# Patient Record
Sex: Male | Born: 1955 | Race: White | Hispanic: No | Marital: Married | Smoking: Never smoker
Health system: Southern US, Community
[De-identification: ages and names within clinical notes are randomized; demographics above are authoritative.]

## PROBLEM LIST (undated history)

## (undated) DIAGNOSIS — E781 Pure hyperglyceridemia: Secondary | ICD-10-CM

## (undated) DIAGNOSIS — F909 Attention-deficit hyperactivity disorder, unspecified type: Secondary | ICD-10-CM

## (undated) DIAGNOSIS — J302 Other seasonal allergic rhinitis: Secondary | ICD-10-CM

## (undated) DIAGNOSIS — G4733 Obstructive sleep apnea (adult) (pediatric): Secondary | ICD-10-CM

## (undated) DIAGNOSIS — L409 Psoriasis, unspecified: Secondary | ICD-10-CM

## (undated) DIAGNOSIS — I499 Cardiac arrhythmia, unspecified: Secondary | ICD-10-CM

## (undated) DIAGNOSIS — J45909 Unspecified asthma, uncomplicated: Secondary | ICD-10-CM

## (undated) DIAGNOSIS — F32A Depression, unspecified: Secondary | ICD-10-CM

## (undated) DIAGNOSIS — J069 Acute upper respiratory infection, unspecified: Secondary | ICD-10-CM

## (undated) DIAGNOSIS — R7303 Prediabetes: Secondary | ICD-10-CM

## (undated) DIAGNOSIS — F329 Major depressive disorder, single episode, unspecified: Secondary | ICD-10-CM

## (undated) HISTORY — PX: OTHER SURGICAL HISTORY: SHX169

## (undated) HISTORY — DX: Acute upper respiratory infection, unspecified: J06.9

## (undated) HISTORY — DX: Cardiac arrhythmia, unspecified: I49.9

## (undated) HISTORY — DX: Prediabetes: R73.03

## (undated) HISTORY — DX: Pure hyperglyceridemia: E78.1

## (undated) HISTORY — DX: Obstructive sleep apnea (adult) (pediatric): G47.33

## (undated) HISTORY — DX: Psoriasis, unspecified: L40.9

## (undated) HISTORY — PX: INGUINAL HERNIA REPAIR: SHX194

---

## 2000-03-08 ENCOUNTER — Ambulatory Visit (HOSPITAL_BASED_OUTPATIENT_CLINIC_OR_DEPARTMENT_OTHER): Admission: RE | Admit: 2000-03-08 | Discharge: 2000-03-08 | Payer: Self-pay | Admitting: *Deleted

## 2004-09-21 DIAGNOSIS — R7303 Prediabetes: Secondary | ICD-10-CM

## 2004-09-21 HISTORY — DX: Prediabetes: R73.03

## 2004-11-04 ENCOUNTER — Ambulatory Visit: Payer: Self-pay | Admitting: Internal Medicine

## 2004-11-16 ENCOUNTER — Ambulatory Visit: Payer: Self-pay | Admitting: Internal Medicine

## 2004-11-16 ENCOUNTER — Ambulatory Visit (HOSPITAL_BASED_OUTPATIENT_CLINIC_OR_DEPARTMENT_OTHER): Admission: RE | Admit: 2004-11-16 | Discharge: 2004-11-16 | Payer: Self-pay | Admitting: Internal Medicine

## 2004-12-01 ENCOUNTER — Ambulatory Visit: Payer: Self-pay | Admitting: Internal Medicine

## 2004-12-29 ENCOUNTER — Ambulatory Visit: Payer: Self-pay | Admitting: Internal Medicine

## 2005-01-22 ENCOUNTER — Ambulatory Visit (HOSPITAL_BASED_OUTPATIENT_CLINIC_OR_DEPARTMENT_OTHER): Admission: RE | Admit: 2005-01-22 | Discharge: 2005-01-22 | Payer: Self-pay | Admitting: Surgery

## 2011-07-13 ENCOUNTER — Other Ambulatory Visit: Payer: Self-pay | Admitting: Family Medicine

## 2011-07-13 ENCOUNTER — Ambulatory Visit
Admission: RE | Admit: 2011-07-13 | Discharge: 2011-07-13 | Disposition: A | Discharge status: Home / Self Care | Payer: BC Managed Care – PPO | Source: Ambulatory Visit | Attending: Family Medicine | Admitting: Family Medicine

## 2011-07-13 DIAGNOSIS — R05 Cough: Secondary | ICD-10-CM

## 2012-03-27 ENCOUNTER — Emergency Department (HOSPITAL_COMMUNITY): Payer: BC Managed Care – PPO

## 2012-03-27 ENCOUNTER — Encounter (HOSPITAL_COMMUNITY): Payer: Self-pay

## 2012-03-27 ENCOUNTER — Emergency Department (HOSPITAL_COMMUNITY)
Admission: EM | Admit: 2012-03-27 | Discharge: 2012-03-27 | Disposition: A | Discharge status: Home / Self Care | Payer: BC Managed Care – PPO | Attending: Emergency Medicine | Admitting: Emergency Medicine

## 2012-03-27 DIAGNOSIS — R05 Cough: Secondary | ICD-10-CM | POA: Insufficient documentation

## 2012-03-27 DIAGNOSIS — R059 Cough, unspecified: Secondary | ICD-10-CM | POA: Insufficient documentation

## 2012-03-27 DIAGNOSIS — F3289 Other specified depressive episodes: Secondary | ICD-10-CM | POA: Insufficient documentation

## 2012-03-27 DIAGNOSIS — F329 Major depressive disorder, single episode, unspecified: Secondary | ICD-10-CM | POA: Insufficient documentation

## 2012-03-27 DIAGNOSIS — J45909 Unspecified asthma, uncomplicated: Secondary | ICD-10-CM | POA: Insufficient documentation

## 2012-03-27 DIAGNOSIS — R61 Generalized hyperhidrosis: Secondary | ICD-10-CM | POA: Insufficient documentation

## 2012-03-27 DIAGNOSIS — R070 Pain in throat: Secondary | ICD-10-CM | POA: Insufficient documentation

## 2012-03-27 DIAGNOSIS — J189 Pneumonia, unspecified organism: Secondary | ICD-10-CM | POA: Insufficient documentation

## 2012-03-27 DIAGNOSIS — R5381 Other malaise: Secondary | ICD-10-CM | POA: Insufficient documentation

## 2012-03-27 DIAGNOSIS — Z79899 Other long term (current) drug therapy: Secondary | ICD-10-CM | POA: Insufficient documentation

## 2012-03-27 DIAGNOSIS — R404 Transient alteration of awareness: Secondary | ICD-10-CM | POA: Insufficient documentation

## 2012-03-27 DIAGNOSIS — R5383 Other fatigue: Secondary | ICD-10-CM | POA: Insufficient documentation

## 2012-03-27 DIAGNOSIS — F909 Attention-deficit hyperactivity disorder, unspecified type: Secondary | ICD-10-CM | POA: Insufficient documentation

## 2012-03-27 DIAGNOSIS — R55 Syncope and collapse: Secondary | ICD-10-CM | POA: Insufficient documentation

## 2012-03-27 DIAGNOSIS — J3489 Other specified disorders of nose and nasal sinuses: Secondary | ICD-10-CM | POA: Insufficient documentation

## 2012-03-27 HISTORY — DX: Other seasonal allergic rhinitis: J30.2

## 2012-03-27 HISTORY — DX: Depression, unspecified: F32.A

## 2012-03-27 HISTORY — DX: Unspecified asthma, uncomplicated: J45.909

## 2012-03-27 HISTORY — DX: Attention-deficit hyperactivity disorder, unspecified type: F90.9

## 2012-03-27 HISTORY — DX: Major depressive disorder, single episode, unspecified: F32.9

## 2012-03-27 LAB — BASIC METABOLIC PANEL
BUN: 22 mg/dL (ref 6–23)
Chloride: 105 mEq/L (ref 96–112)
GFR calc Af Amer: 67 mL/min — ABNORMAL LOW (ref >90)
GFR calc non Af Amer: 58 mL/min — ABNORMAL LOW (ref >90)
Potassium: 3.8 mEq/L (ref 3.5–5.1)
Sodium: 140 mEq/L (ref 135–145)

## 2012-03-27 LAB — URINALYSIS, ROUTINE W REFLEX MICROSCOPIC
Hgb urine dipstick: NEGATIVE
Leukocytes, UA: NEGATIVE
Nitrite: NEGATIVE
Protein, ur: NEGATIVE mg/dL
Specific Gravity, Urine: 1.027 (ref 1.005–1.030)
Urobilinogen, UA: 0.2 mg/dL (ref 0.0–1.0)

## 2012-03-27 LAB — CBC WITH DIFFERENTIAL/PLATELET
Basophils Absolute: 0 10*3/uL (ref 0.0–0.1)
Basophils Relative: 0 % (ref 0–1)
Eosinophils Absolute: 0.1 10*3/uL (ref 0.0–0.7)
Hemoglobin: 14 g/dL (ref 13.0–17.0)
MCH: 30.4 pg (ref 26.0–34.0)
MCHC: 35.4 g/dL (ref 30.0–36.0)
Monocytes Relative: 2 % — ABNORMAL LOW (ref 3–12)
Neutro Abs: 10.8 10*3/uL — ABNORMAL HIGH (ref 1.7–7.7)
Neutrophils Relative %: 94 % — ABNORMAL HIGH (ref 43–77)
Platelets: 166 10*3/uL (ref 150–400)

## 2012-03-27 LAB — TROPONIN I: Troponin I: <0.3 ng/mL (ref <0.30)

## 2012-03-27 MED ORDER — DEXTROSE 5 % IV SOLN
1.0000 g | Freq: Once | INTRAVENOUS | Status: AC
  Administered 2012-03-27: 1 g via INTRAVENOUS
  Filled 2012-03-27: qty 10

## 2012-03-27 MED ORDER — SODIUM CHLORIDE 0.9 % IV BOLUS (SEPSIS)
1000.0000 mL | Freq: Once | INTRAVENOUS | Status: AC
  Administered 2012-03-27: 1000 mL via INTRAVENOUS

## 2012-03-27 MED ORDER — BENZONATATE 100 MG PO CAPS: 100.0000 mg | ORAL_CAPSULE | Freq: Three times a day (TID) | ORAL | Status: AC

## 2012-03-27 MED ORDER — AZITHROMYCIN 250 MG PO TABS: 250.0000 mg | ORAL_TABLET | Freq: Every day | ORAL | Status: AC

## 2012-03-27 NOTE — ED Notes (Signed)
Pt's orthostatic VS taken.  Pt ambulated without difficulty.

## 2012-03-27 NOTE — ED Notes (Signed)
PA at bedside.

## 2012-03-27 NOTE — ED Notes (Signed)
Patient transported to X-ray 

## 2012-03-27 NOTE — ED Notes (Signed)
The patient was in the middle of urinating (while standing up).  He does not remember bearing down to urinate or to pass gas.  Suddenly, he woke up on the floor.  He says he does not remember feeling lightheaded, dizzy, or weak.

## 2012-03-27 NOTE — ED Notes (Signed)
Pt reports hot having any clothes, came in ems, pt given paper scrubs to go home in.

## 2012-03-27 NOTE — ED Provider Notes (Signed)
History     CSN: [id number]  Arrival date & time 03/27/12  0605   First MD Initiated Contact with Patient 03/27/12 [phone]      Chief Complaint  Patient presents with  . Loss of Consciousness    (Consider location/radiation/quality/duration/timing/severity/associated sxs/prior treatment) HPI Comments: Pt presents after episode of LOC. He got up to use the bathroom and after urinating lost consciousness. He does not recall any preceding symptoms (visual change, dizziness, weakness, palpitations, chest pain, SOB). Spouse reports that she heard him fall and found him conscious and sweaty appearing but he was not immediately responding to her questions. She denies noting any seizure like activity. Pt reports that he currently feels a little "weak."  Pt has never had anything like this before. He has no known cardiac, seizure, neurologic hx. He has been diagnosed with htn previously but is not currently taking medication for this.  Pt reports that he has been coughing and has been congested with sore throat.  Wife does report that they were recently on an all night flight from [place] to [place]. Pt has not had any leg swelling, hemoptysis. He denies SOB or CP.  Patient is a 56 y.o. male presenting with syncope. The history is provided by the patient and the spouse.  Loss of Consciousness This is a new problem. The current episode started today. Associated symptoms include congestion, coughing, diaphoresis, a sore throat and weakness. Pertinent negatives include no abdominal pain, chest pain, headaches, myalgias, nausea, neck pain, numbness or vomiting.    Past Medical History  Diagnosis Date  . ADHD (attention deficit hyperactivity disorder)   . Depression   . Asthma   . Seasonal allergies     Past Surgical History  Procedure Date  . Inguinal hernia repair     right side (4-5 years ago)    Family History  Problem Relation Age of Onset  . Depression Mother   .  Tuberculosis Mother   . Osteoporosis Mother   . Diabetes type II Father     History  Substance Use Topics  . Smoking status: Never Smoker   . Smokeless tobacco: Not on file  . Alcohol Use: 8.4 oz/week    14 Cans of beer per week      Review of Systems  Constitutional: Positive for diaphoresis.  HENT: Positive for congestion and sore throat. Negative for facial swelling, neck pain and neck stiffness.   Eyes: Negative for visual disturbance.  Respiratory: Positive for cough. Negative for shortness of breath.   Cardiovascular: Positive for syncope. Negative for chest pain, palpitations and leg swelling.  Gastrointestinal: Negative for nausea, vomiting and abdominal pain.  Musculoskeletal: Negative for myalgias.  Neurological: Positive for syncope and weakness. Negative for dizziness, seizures, facial asymmetry, numbness and headaches.  All other systems reviewed and are negative.    Allergies  Review of patient's allergies indicates no known allergies.  Home Medications   Current Outpatient Rx  Name Route Sig Dispense Refill  . AMPHETAMINE-DEXTROAMPHET ER 30 MG PO CP24 Oral Take 30 mg by mouth every morning.    Marland Kitchen BUPROPION HCL ER (XL) 300 MG PO TB24 Oral Take 300 mg by mouth daily.    Marland Kitchen FLUOXETINE HCL 20 MG PO CAPS Oral Take 20 mg by mouth daily.    Marland Kitchen FLUTICASONE-SALMETEROL 250-50 MCG/DOSE IN AEPB Inhalation Inhale 1 puff into the lungs every 12 (twelve) hours.      BP 110/64  Pulse 69  Temp 98.5 F (  36.9 C) (Oral)  Resp 18  Ht 5\' 10"  (1.778 m)  Wt 195 lb (88.451 kg)  BMI 27.98 kg/m2  SpO2 97%  Physical Exam  Nursing note and vitals reviewed. Constitutional: He is oriented to person, place, and time. He appears well-developed and well-nourished. No distress.       Mildly diaphoretic, uncomfortable appearing  HENT:  Head: Normocephalic and atraumatic.  Mouth/Throat: Oropharynx is clear and moist. No oropharyngeal exudate.       Mildly erythematous posterior  oropharynx c/w postnasal drip  Eyes: EOM are normal. Pupils are equal, round, and reactive to light.  Neck: Normal range of motion. Neck supple.  Cardiovascular: Normal rate, regular rhythm and normal heart sounds.  Exam reveals no gallop and no friction rub.   No murmur heard. Pulmonary/Chest: Effort normal and breath sounds normal. He has no wheezes. He exhibits no tenderness.  Abdominal: Soft. Bowel sounds are normal. There is no tenderness. There is no rebound and no guarding.  Musculoskeletal: Normal range of motion.       Spine: No palpable stepoff, crepitus, or gross deformity appreciated. No appreciable spasm of paravertebral muscles. No midline tenderness.  Neurological: He is alert and oriented to person, place, and time.       Speech clear, pupils equal round reactive to light, extraocular movements intact   Normal peripheral visual fields Cranial nerves III through XII normal including no facial droop Follows commands, moves all extremities x4, normal strength to bilateral upper and lower extremities at all major muscle groups including grip Sensation normal to light touch Coordination intact, no limb ataxia, finger-nose-finger normal Rapid alternating movements normal No pronator drift Gait normal  Skin: Skin is warm. He is diaphoretic.  Psychiatric: He has a normal mood and affect.    ED Course  Procedures (including critical care time)   Date: 03/27/2012  Rate: 68  Rhythm: normal sinus rhythm  QRS Axis: normal  Intervals: PR prolonged  ST/T Wave abnormalities: normal  Conduction Disutrbances:first-degree A-V block   Narrative Interpretation:   Old EKG Reviewed: none available  Labs Reviewed  CBC WITH DIFFERENTIAL - Abnormal; Notable for the following:    WBC 11.5 (*)     Neutrophils Relative 94 (*)     Neutro Abs 10.8 (*)     Lymphocytes Relative 3 (*)     Lymphs Abs 0.4 (*)     Monocytes Relative 2 (*)     All other components within normal limits  BASIC  METABOLIC PANEL - Abnormal; Notable for the following:    Glucose, Bld 126 (*)     GFR calc non Af Amer 58 (*)     GFR calc Af Amer 67 (*)     All other components within normal limits  URINALYSIS, ROUTINE W REFLEX MICROSCOPIC - Abnormal; Notable for the following:    Color, Urine AMBER (*)  BIOCHEMICALS MAY BE AFFECTED BY COLOR   APPearance CLOUDY (*)     Bilirubin Urine SMALL (*)     Ketones, ur 15 (*)     All other components within normal limits  TROPONIN I  LAB REPORT - SCANNED   Dg Chest 2 View  03/27/2012  *RADIOLOGY REPORT*  Clinical Data: Syncope.  CHEST - 2 VIEW  Comparison: Two-view chest x-ray 07/13/2011.  Findings: Cardiac silhouette upper normal in size but stable. Thoracic aorta minimally atherosclerotic, unchanged.  Hilar and mediastinal contours otherwise unremarkable.  Airspace consolidation in the medial left lower lobe.  Lungs otherwise clear.  No pleural effusions.  Visualized bony thorax intact.  IMPRESSION: Left lower lobe pneumonia.  Original Report Authenticated By: Arnell Sieving, M.D.   Ct Head Wo Contrast  03/27/2012  *RADIOLOGY REPORT*  Clinical Data: Syncope with loss of consciousness.  CT HEAD WITHOUT CONTRAST  Technique:  Contiguous axial images were obtained from the base of the skull through the vertex without contrast.  Comparison: None.  Findings: Ventricular system normal in size and appearance for age. No mass lesion.  No midline shift.  No acute hemorrhage or hematoma.  No extra-axial fluid collections.  No evidence of acute infarction.  Physiologic calcification in the right basal ganglia. No significant focal brain parenchymal abnormalities.  No focal osseous abnormalities involving the skull.  Visualized paranasal sinuses, mastoid air cells, and middle ear cavities well- aerated.  IMPRESSION: Normal examination.  Original Report Authenticated By: Arnell Sieving, M.D.   I personally reviewed the imaging studies  1. CAP (community acquired  pneumonia)   2. Syncope       MDM  Pt with post micturition syncope. No neuro deficits on exam. Head CT negative; ECG with 1st deg AVB but no ischemic changes; negative trop. CXR shows LLL pneumonia. Pt initially uncomfortable appearing, was given 1L bolus and felt and appeared better with this. He is not orthostatic and was able to ambulate without difficulty after receiving bolus.  PE is a consideration, but given lack of abnormal vitals, no CP/SOB/hemoptysis/leg swelling, and presence of cough and congestion, feel this is not likely. CXR findings and leukocytosis support dx of PNA.  Pt given dose of rocephin in dept, discharged with z-pack. Strict return precautions discussed. Pt and spouse verbalized understanding and agreed to plan.  Grant Fontana, PA-C 03/28/12 1003

## 2012-03-27 NOTE — ED Notes (Signed)
Family at bedside. 

## 2012-03-27 NOTE — ED Notes (Signed)
MD at bedside. EDPA Mayford Knife

## 2012-03-27 NOTE — ED Notes (Signed)
Pt reports a syncopal episode while standing this am to urinate, nausea, weakness, and upper back "cramping. Pt denies chest/abd pain, V/D, diaphoresis, or dizziness. 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

## 2012-03-28 NOTE — ED Provider Notes (Signed)
Medical screening examination/treatment/procedure(s) were conducted as a shared visit with non-physician practitioner(s) and myself.  I personally evaluated the patient during the encounter.  I saw the patient along with C. Williams and agree with her note, assessment, and plan.  The patient presents with complaints of cough for several days, this morning he felt weak and passed out in the bathroom while urinating.  He continues to feel light-headed upon arrival here.  On exam, the patient vitals are stable and the patient is afebrile.  The heart, lungs, and neurological exam are all within normal limits.  The workup reveals a pneumonia on chest xray, mild wbc count, but is otherwise unremarkable.  He was hydrated and observed in the ER for some time.  He began to feel much better and was ambulated without incident.  We had offered admission, however the patient preferred to go home.  He was given rocephin and zithromax in the ED and will be discharged with zmax.  He agrees to return if he experiences further difficulty.    Geoffery Lyons, MD 03/28/12 1500

## 2013-09-22 ENCOUNTER — Encounter: Payer: Self-pay | Admitting: General Surgery

## 2013-09-22 DIAGNOSIS — G4733 Obstructive sleep apnea (adult) (pediatric): Secondary | ICD-10-CM

## 2013-10-16 ENCOUNTER — Encounter: Payer: Self-pay | Admitting: Cardiology

## 2013-10-16 ENCOUNTER — Ambulatory Visit (INDEPENDENT_AMBULATORY_CARE_PROVIDER_SITE_OTHER): Payer: BC Managed Care – PPO | Admitting: Cardiology

## 2013-10-16 VITALS — BP 132/80 | HR 82 | Ht 69.0 in | Wt 199.0 lb

## 2013-10-16 DIAGNOSIS — G4733 Obstructive sleep apnea (adult) (pediatric): Secondary | ICD-10-CM

## 2013-10-16 NOTE — Patient Instructions (Signed)
Your physician recommends that you continue on your current medications as directed. Please refer to the Current Medication list given to you today.  Your physician wants you to follow-up in: 6 Months with Dr Turner You will receive a reminder letter in the mail two months in advance. If you don't receive a letter, please call our office to schedule the follow-up appointment.  

## 2013-10-16 NOTE — Progress Notes (Signed)
[place], [address] [name], [place]  [address] Phone: [phone] Fax:  [phone]  Date:  10/16/2013   ID:  Jeffery Underwood, DOB [birth year], MRN [id number]  PCP:  Jeffery Coma, MD  Sleep Medicine:  Jeffery Him, MD   History of Present Illness: Jeffery Underwood is a 58 y.o. male with a history of OSA presents today for followup.  He is doing well.  He tolerates his CPAP well.  He uses a nasal pillow mask which he tolerates and feels the pressure is adequate.  He feels rested when he gets up and has no daytime sleepiness.  He exercises 1-3 times weekly for an hour cardio and weights.   Wt Readings from Last 3 Encounters:  10/16/13 199 lb (90.266 kg)  04/13/13 197 lb (89.359 kg)  03/27/12 195 lb (88.451 kg)     Past Medical History  Diagnosis Date  . ADHD (attention deficit hyperactivity disorder)   . Depression   . Asthma   . Seasonal allergies   . OSA (obstructive sleep apnea)   . Psoriasis   . Prediabetes 2006    resolved 10/2011  . Hypertriglyceridemia     Current Outpatient Prescriptions  Medication Sig Dispense Refill  . albuterol (PROVENTIL HFA;VENTOLIN HFA) 108 (90 BASE) MCG/ACT inhaler Inhale 2 puffs into the lungs as needed for wheezing or shortness of breath.      . amphetamine-dextroamphetamine (ADDERALL XR) 30 MG 24 hr capsule Take 45 mg by mouth every morning.       Marland Kitchen aspirin 81 MG tablet Take 81 mg by mouth daily.      . budesonide-formoterol (SYMBICORT) 160-4.5 MCG/ACT inhaler Inhale 2 puffs into the lungs 2 (two) times daily.      Marland Kitchen buPROPion (WELLBUTRIN SR) 150 MG 12 hr tablet Take 150 mg by mouth daily.      . cetirizine (ZYRTEC) 10 MG tablet Take 10 mg by mouth daily.      Marland Kitchen FLUoxetine (PROZAC) 20 MG capsule Take 20 mg by mouth daily.      . Multiple Vitamins-Minerals (MULTIVITAMIN PO) Take by mouth daily.       No current facility-administered medications for this visit.    Allergies:   No Known Allergies  Social History:  The patient  reports  that he has never smoked. He does not have any smokeless tobacco history on file. He reports that he drinks about 8.4 ounces of alcohol per week. He reports that he does not use illicit drugs.   Family History:  The patient's family history includes Depression in his mother; Diabetes type II in his father; Osteoporosis in his mother; Tuberculosis in his mother.   ROS:  Please see the history of present illness.      All other systems reviewed and negative.   PHYSICAL EXAM: VS:  BP 132/80  Pulse 82  Ht 5\' 9"  (1.753 m)  Wt 199 lb (90.266 kg)  BMI 29.37 kg/m2  SpO2 95% Well nourished, well developed, in no acute distress HEENT: normal Neck: no JVD Cardiac:  normal S1, S2; RRR; no murmur Lungs:  clear to auscultation bilaterally, no wheezing, rhonchi or rales Abd: soft, nontender, no hepatomegaly Ext: no edema Skin: warm and dry Neuro:  CNs 2-12 intact, no focal abnormalities noted       ASSESSMENT AND PLAN:  1. OSA on CPAP and tolerating well.  His download today showed an AHI of 1.1/hr on 17cm H2O.  He uses his device 98% of the time  more than 4 hours nightly. He will continue on current CPAP setting.  Followup with me in 6 months  Signed, Jeffery Him, MD 10/16/2013 10:34 AM

## 2013-11-09 ENCOUNTER — Encounter: Payer: Self-pay | Admitting: Cardiology

## 2013-12-30 IMAGING — CT CT HEAD W/O CM
1 of 2 series · 16 of 30 positions shown, 20 images · non-contrast
Comparison: None.

CLINICAL DATA: Syncope with loss of consciousness.

CT HEAD WITHOUT CONTRAST
TECHNIQUE: Contiguous axial images were obtained from the base of
the skull through the vertex without contrast.

[Series 3: recon 2: brain · axial · 0.49mm/px · z∈[+150,+288]mm · 16 of 56 slices shown, 20 images]
[im 3/56  brain]
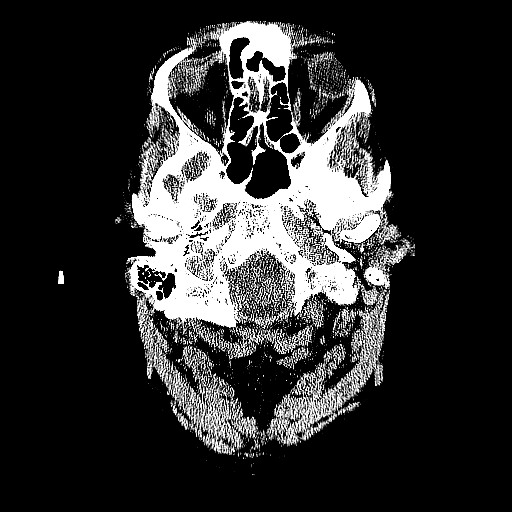
[im 3/56  bone]
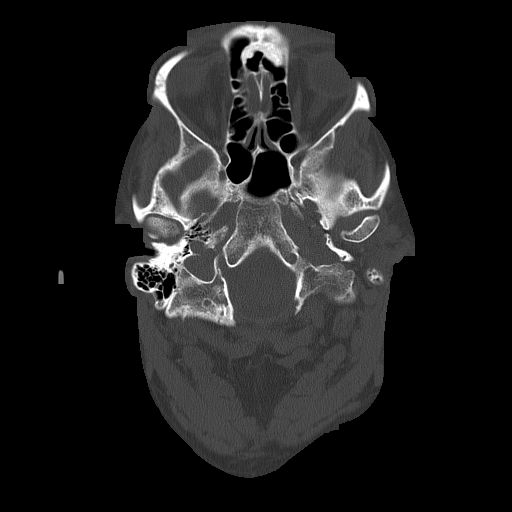
[im 6/56  brain]
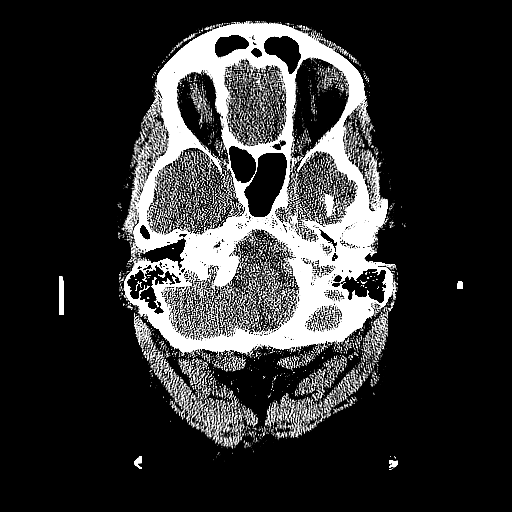
[im 9/56  brain]
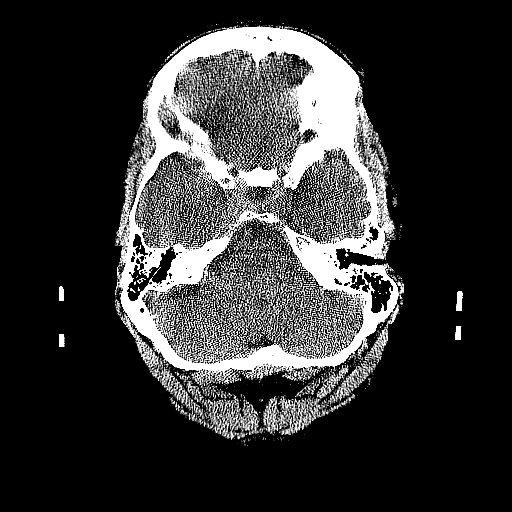
[im 12/56  brain]
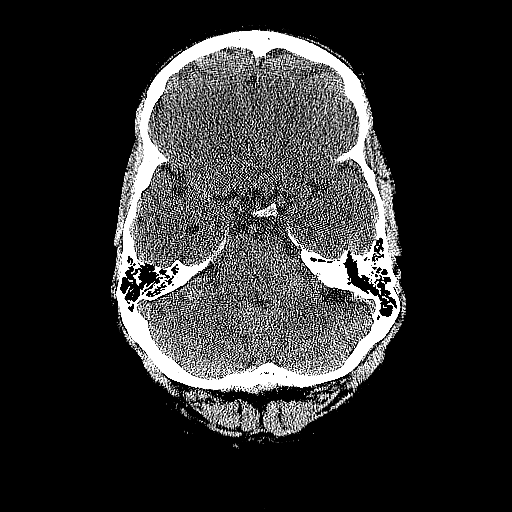
[im 18/56  brain]
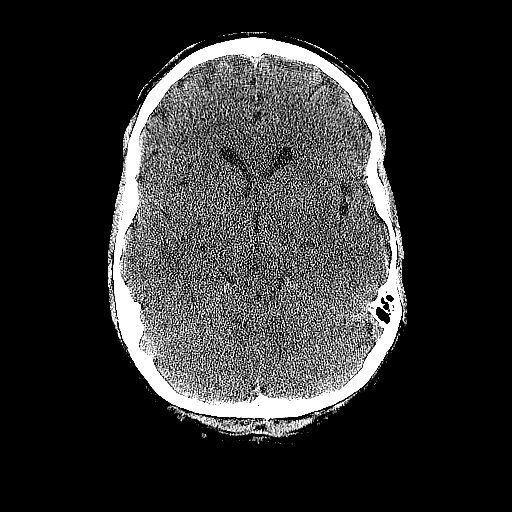
[im 18/56  bone]
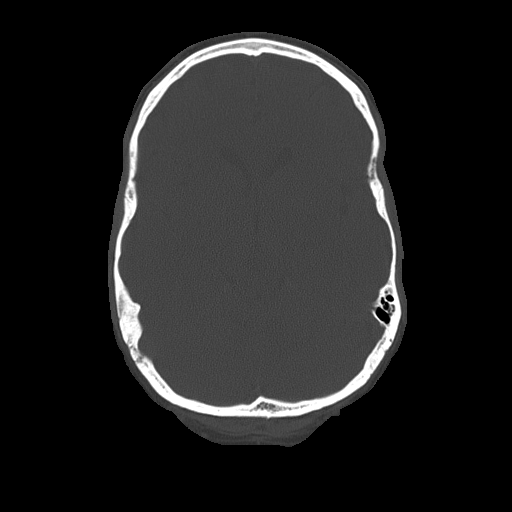
[im 21/56  brain]
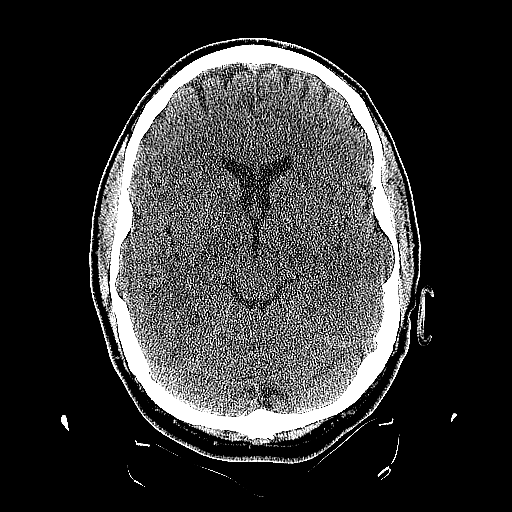
[im 24/56  brain]
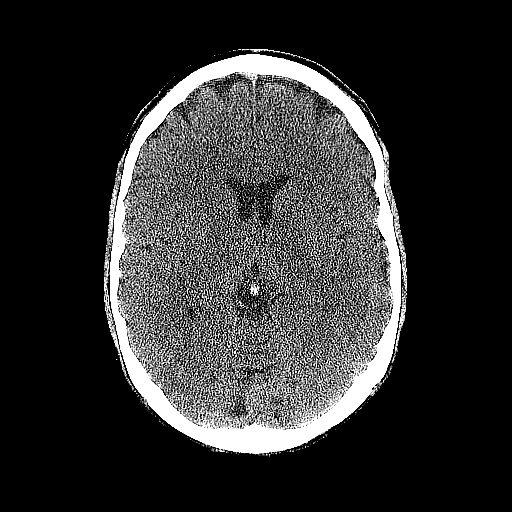
[im 27/56  brain]
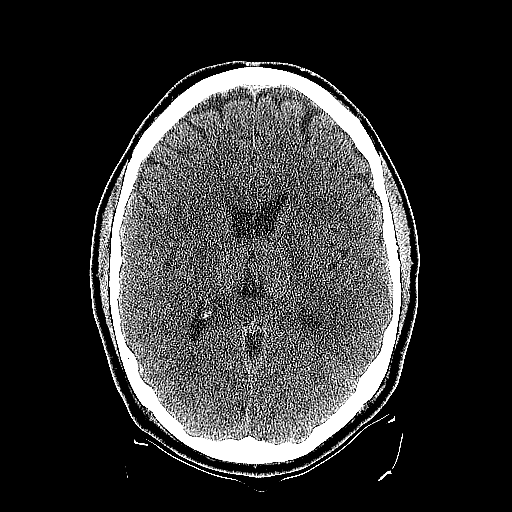
[im 29/56  brain]
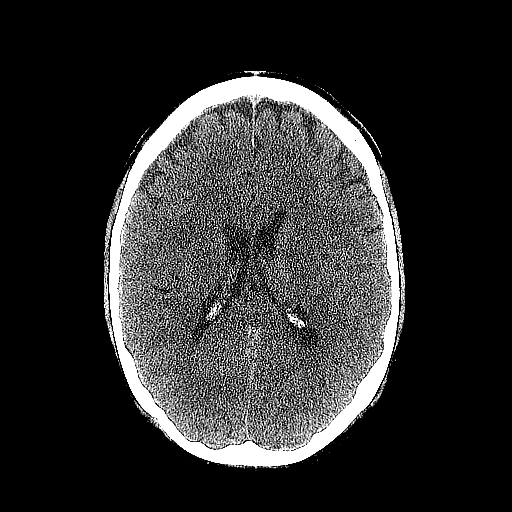
[im 29/56  bone]
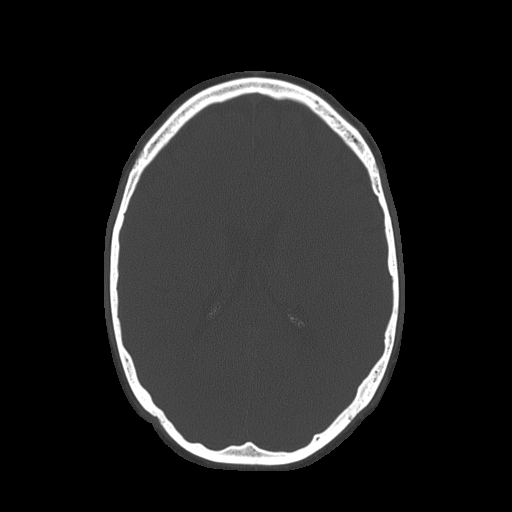
[im 32/56  brain]
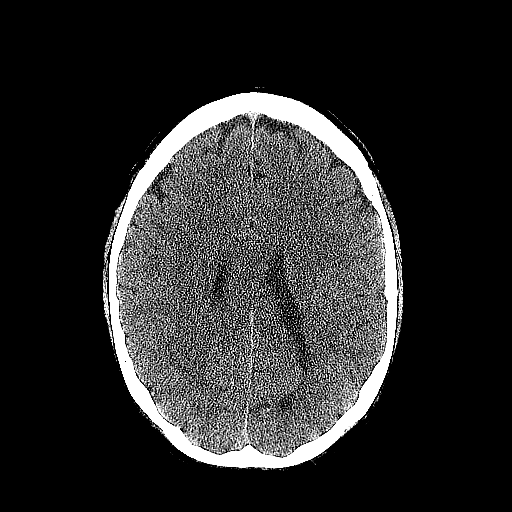
[im 35/56  brain]
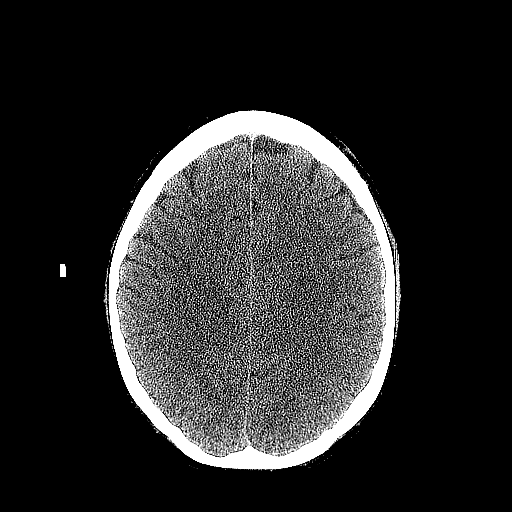
[im 38/56  brain]
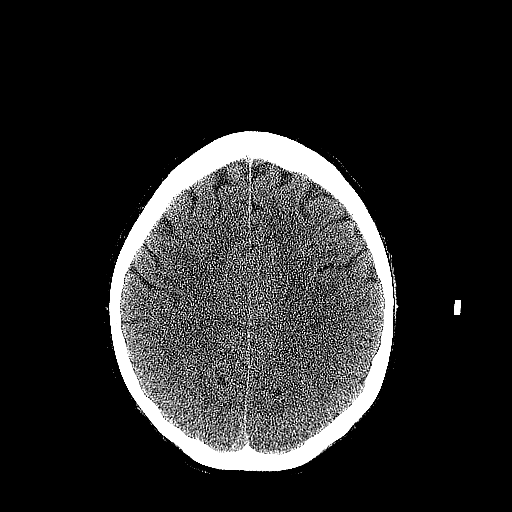
[im 44/56  brain]
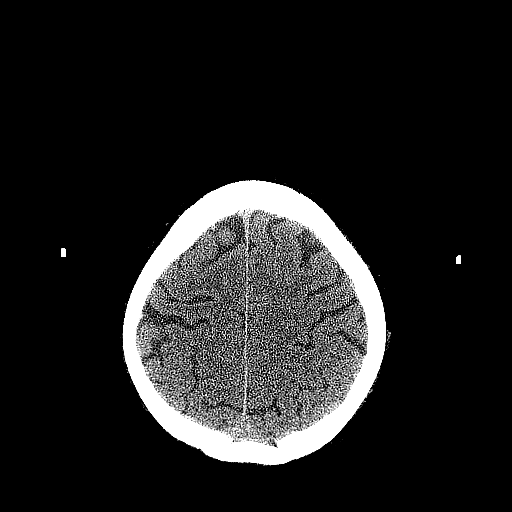
[im 44/56  bone]
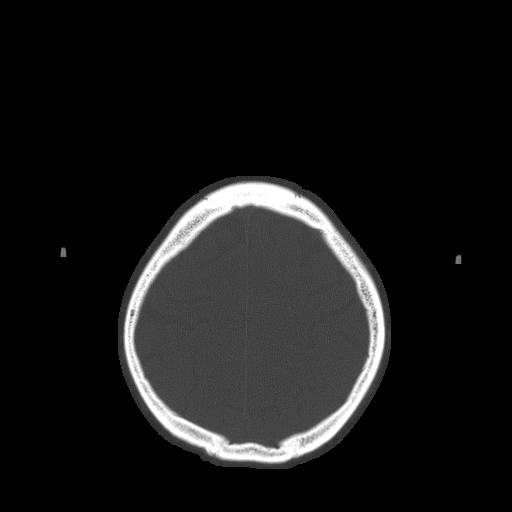
[im 47/56  brain]
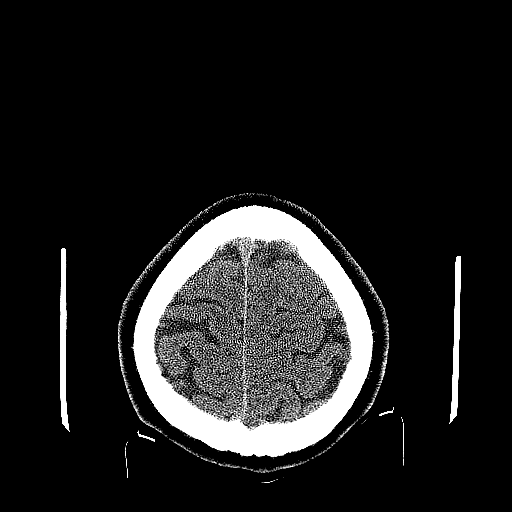
[im 50/56  brain]
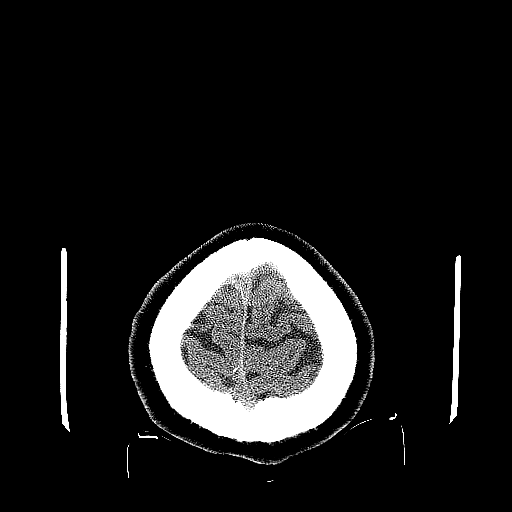
[im 53/56  brain]
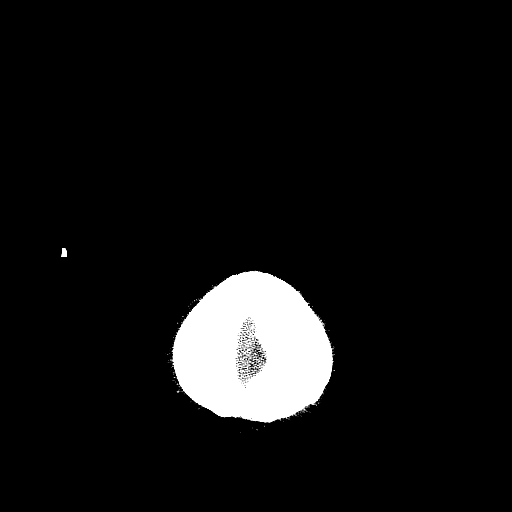

[16 of 30 positions shown; findings below may reference images not displayed]

FINDINGS: Ventricular system normal in size and appearance for age.
No mass lesion.  No midline shift.  No acute hemorrhage or
hematoma.  No extra-axial fluid collections.  No evidence of acute
infarction.  Physiologic calcification in the right basal ganglia.
No significant focal brain parenchymal abnormalities.

No focal osseous abnormalities involving the skull.  Visualized
paranasal sinuses, mastoid air cells, and middle ear cavities well-
aerated.
IMPRESSION: Normal examination.

## 2014-12-17 NOTE — Progress Notes (Signed)
Cardiology Office Note   Date:  12/18/2014   ID:  Jeffery Underwood, DOB 1956-08-28, MRN [id number]  PCP:  Lilian Coma, MD  Cardiologist:   Sueanne Margarita, MD   Chief Complaint  Patient presents with  . Sleep Apnea      History of Present Illness: Jeffery Underwood is a 59 y.o. male with a history of OSA presents today for followup. He is doing well. He tolerates his CPAP well. He uses a nasal pillow mask which he tolerates and feels the pressure is adequate. He feels rested when he gets up and has no daytime sleepiness. He denies any nasal congestion.  He exercises 1-3 times weekly for an hour cardio and weights.  He does snore some mainly when sleeping on his back.       Past Medical History  Diagnosis Date  . ADHD (attention deficit hyperactivity disorder)   . Depression   . Asthma   . Seasonal allergies   . OSA (obstructive sleep apnea)   . Psoriasis   . Prediabetes 2006    resolved 10/2011  . Hypertriglyceridemia     Past Surgical History  Procedure Laterality Date  . Inguinal hernia repair      right side (4-5 years ago)     Current Outpatient Prescriptions  Medication Sig Dispense Refill  . albuterol (PROVENTIL HFA;VENTOLIN HFA) 108 (90 BASE) MCG/ACT inhaler Inhale 2 puffs into the lungs as needed for wheezing or shortness of breath.    . amphetamine-dextroamphetamine (ADDERALL XR) 30 MG 24 hr capsule Take 45 mg by mouth every morning.     Marland Kitchen aspirin 81 MG tablet Take 81 mg by mouth daily.    . budesonide-formoterol (SYMBICORT) 160-4.5 MCG/ACT inhaler Inhale 2 puffs into the lungs 2 (two) times daily.    Marland Kitchen buPROPion (WELLBUTRIN XL) 150 MG 24 hr tablet Take 150 mg by mouth daily.    . cetirizine (ZYRTEC) 10 MG tablet Take 10 mg by mouth daily.    Marland Kitchen FLUoxetine (PROZAC) 10 MG capsule Take 10 mg by mouth daily. Pt states three 10 mg tablets by mouth daily    . Multiple Vitamins-Minerals (MULTIVITAMIN PO) Take by mouth daily.     No current facility-administered  medications for this visit.    Allergies:   Review of patient's allergies indicates no known allergies.    Social History:  The patient  reports that he has never smoked. He does not have any smokeless tobacco history on file. He reports that he drinks about 8.4 oz of alcohol per week. He reports that he does not use illicit drugs.   Family History:  The patient's family history includes Depression in his mother; Diabetes type II in his father; Osteoporosis in his mother; Tuberculosis in his mother.    ROS:  Please see the history of present illness.   Otherwise, review of systems are positive for none.   All other systems are reviewed and negative.    PHYSICAL EXAM: VS:  BP 140/74 mmHg  Pulse 81  Ht 5\' 10"  (1.778 m)  Wt 193 lb 12.8 oz (87.907 kg)  BMI 27.81 kg/m2  SpO2 97% , BMI Body mass index is 27.81 kg/(m^2). GEN: Well nourished, well developed, in no acute distress HEENT: normal Neck: no JVD, carotid bruits, or masses Cardiac: RRR; no murmurs, rubs, or gallops,no edema  Respiratory:  clear to auscultation bilaterally, normal work of breathing GI: soft, nontender, nondistended, + BS MS: no deformity or atrophy Skin: warm and dry,  no rash Neuro:  Strength and sensation are intact Psych: euthymic mood, full affect   EKG:  EKG is not ordered today. Th   Recent Labs: No results found for requested labs within last 365 days.    Lipid Panel No results found for: CHOL, TRIG, HDL, CHOLHDL, VLDL, LDLCALC, LDLDIRECT    Wt Readings from Last 3 Encounters:  12/18/14 193 lb 12.8 oz (87.907 kg)  10/16/13 199 lb (90.266 kg)  04/13/13 197 lb (89.359 kg)    ASSESSMENT AND PLAN:  1. OSA on CPAP and tolerating well. His download today showed an AHI of 1.3/hr on 17cm H2O. He uses his device 91% of the time more than 4 hours nightly. He will continue on current CPAP setting.    Current medicines are reviewed at length with the patient today.  The patient does not have  concerns regarding medicines.  The following changes have been made:  no change  Labs/ tests ordered today include: None  No orders of the defined types were placed in this encounter.     Disposition:   FU with me in 6 months   Signed, Sueanne Margarita, MD  12/18/2014 8:29 PM    [facility] [address], [place], [place]  [address] Phone: [phone]; Fax: [phone]

## 2014-12-18 ENCOUNTER — Ambulatory Visit (INDEPENDENT_AMBULATORY_CARE_PROVIDER_SITE_OTHER): Payer: Self-pay | Admitting: Cardiology

## 2014-12-18 ENCOUNTER — Encounter: Payer: Self-pay | Admitting: Cardiology

## 2014-12-18 VITALS — BP 140/74 | HR 81 | Ht 70.0 in | Wt 193.8 lb

## 2014-12-18 DIAGNOSIS — G4733 Obstructive sleep apnea (adult) (pediatric): Secondary | ICD-10-CM

## 2014-12-18 NOTE — Patient Instructions (Addendum)
Your physician wants you to follow-up in: 1 year with Dr. Turner. You will receive a reminder letter in the mail two months in advance. If you don't receive a letter, please call our office to schedule the follow-up appointment.  

## 2014-12-26 ENCOUNTER — Telehealth: Payer: Self-pay | Admitting: Cardiology

## 2014-12-26 NOTE — Telephone Encounter (Signed)
Calling stating that he needs a new Rx sent to [facility] for his CPAP equipment.  States needs new mask, head gear, hose and humidifier.  States [facility] needs new Rx everytime needs equipment.  Advised will forward to Dr. Theodosia Blender CMA, Talbert Cage.  He understands and will wait for call.

## 2014-12-26 NOTE — Telephone Encounter (Signed)
New message      Need presc for CPAP supplies sent to [facility].

## 2014-12-27 ENCOUNTER — Telehealth: Payer: Self-pay | Admitting: *Deleted

## 2014-12-27 DIAGNOSIS — Z9989 Dependence on other enabling machines and devices: Principal | ICD-10-CM

## 2014-12-27 DIAGNOSIS — G4733 Obstructive sleep apnea (adult) (pediatric): Secondary | ICD-10-CM

## 2014-12-27 NOTE — Telephone Encounter (Signed)
I spoke with Mr. Jeffery Underwood to let him know that I was working on getting his new equipment.    I have sent a staff message to Darlina Guys to find out exactly what all they need from me. Once I hear back from her I will place the orders.

## 2014-12-27 NOTE — Telephone Encounter (Signed)
Orders placed for new equipment Message sent to [facility]

## 2014-12-28 ENCOUNTER — Encounter: Payer: Self-pay | Admitting: Cardiology

## 2016-02-18 DIAGNOSIS — K5730 Diverticulosis of large intestine without perforation or abscess without bleeding: Secondary | ICD-10-CM | POA: Diagnosis not present

## 2016-02-18 DIAGNOSIS — Z1211 Encounter for screening for malignant neoplasm of colon: Secondary | ICD-10-CM | POA: Diagnosis not present

## 2016-09-03 DIAGNOSIS — D492 Neoplasm of unspecified behavior of bone, soft tissue, and skin: Secondary | ICD-10-CM | POA: Diagnosis not present

## 2016-09-03 DIAGNOSIS — D0462 Carcinoma in situ of skin of left upper limb, including shoulder: Secondary | ICD-10-CM | POA: Diagnosis not present

## 2016-09-03 DIAGNOSIS — L570 Actinic keratosis: Secondary | ICD-10-CM | POA: Diagnosis not present

## 2016-09-10 DIAGNOSIS — G4733 Obstructive sleep apnea (adult) (pediatric): Secondary | ICD-10-CM | POA: Diagnosis not present

## 2016-09-16 DIAGNOSIS — H25013 Cortical age-related cataract, bilateral: Secondary | ICD-10-CM | POA: Diagnosis not present

## 2016-09-29 DIAGNOSIS — G4733 Obstructive sleep apnea (adult) (pediatric): Secondary | ICD-10-CM | POA: Diagnosis not present

## 2016-10-21 DIAGNOSIS — H21233 Degeneration of iris (pigmentary), bilateral: Secondary | ICD-10-CM | POA: Diagnosis not present

## 2016-11-26 DIAGNOSIS — H25811 Combined forms of age-related cataract, right eye: Secondary | ICD-10-CM | POA: Diagnosis not present

## 2016-11-26 DIAGNOSIS — H2511 Age-related nuclear cataract, right eye: Secondary | ICD-10-CM | POA: Diagnosis not present

## 2016-12-09 DIAGNOSIS — Z09 Encounter for follow-up examination after completed treatment for conditions other than malignant neoplasm: Secondary | ICD-10-CM | POA: Diagnosis not present

## 2016-12-10 DIAGNOSIS — G4733 Obstructive sleep apnea (adult) (pediatric): Secondary | ICD-10-CM | POA: Diagnosis not present

## 2016-12-10 DIAGNOSIS — J301 Allergic rhinitis due to pollen: Secondary | ICD-10-CM | POA: Diagnosis not present

## 2016-12-21 DIAGNOSIS — G4733 Obstructive sleep apnea (adult) (pediatric): Secondary | ICD-10-CM | POA: Diagnosis not present

## 2016-12-23 DIAGNOSIS — D492 Neoplasm of unspecified behavior of bone, soft tissue, and skin: Secondary | ICD-10-CM | POA: Diagnosis not present

## 2016-12-29 DIAGNOSIS — Z0000 Encounter for general adult medical examination without abnormal findings: Secondary | ICD-10-CM | POA: Diagnosis not present

## 2016-12-29 DIAGNOSIS — E781 Pure hyperglyceridemia: Secondary | ICD-10-CM | POA: Diagnosis not present

## 2016-12-29 DIAGNOSIS — Z79899 Other long term (current) drug therapy: Secondary | ICD-10-CM | POA: Diagnosis not present

## 2016-12-29 DIAGNOSIS — R7301 Impaired fasting glucose: Secondary | ICD-10-CM | POA: Diagnosis not present

## 2016-12-29 DIAGNOSIS — Z125 Encounter for screening for malignant neoplasm of prostate: Secondary | ICD-10-CM | POA: Diagnosis not present

## 2017-01-05 DIAGNOSIS — H2512 Age-related nuclear cataract, left eye: Secondary | ICD-10-CM | POA: Diagnosis not present

## 2017-01-05 DIAGNOSIS — H25812 Combined forms of age-related cataract, left eye: Secondary | ICD-10-CM | POA: Diagnosis not present

## 2017-01-20 DIAGNOSIS — G4733 Obstructive sleep apnea (adult) (pediatric): Secondary | ICD-10-CM | POA: Diagnosis not present

## 2017-02-20 DIAGNOSIS — G4733 Obstructive sleep apnea (adult) (pediatric): Secondary | ICD-10-CM | POA: Diagnosis not present

## 2017-03-11 DIAGNOSIS — G4733 Obstructive sleep apnea (adult) (pediatric): Secondary | ICD-10-CM | POA: Diagnosis not present

## 2017-03-22 DIAGNOSIS — G4733 Obstructive sleep apnea (adult) (pediatric): Secondary | ICD-10-CM | POA: Diagnosis not present

## 2017-04-01 DIAGNOSIS — J069 Acute upper respiratory infection, unspecified: Secondary | ICD-10-CM | POA: Diagnosis not present

## 2017-04-01 DIAGNOSIS — J029 Acute pharyngitis, unspecified: Secondary | ICD-10-CM | POA: Diagnosis not present

## 2017-04-03 DIAGNOSIS — J069 Acute upper respiratory infection, unspecified: Secondary | ICD-10-CM | POA: Diagnosis not present

## 2017-04-03 DIAGNOSIS — H6592 Unspecified nonsuppurative otitis media, left ear: Secondary | ICD-10-CM | POA: Diagnosis not present

## 2017-04-22 DIAGNOSIS — G4733 Obstructive sleep apnea (adult) (pediatric): Secondary | ICD-10-CM | POA: Diagnosis not present

## 2017-05-23 DIAGNOSIS — G4733 Obstructive sleep apnea (adult) (pediatric): Secondary | ICD-10-CM | POA: Diagnosis not present

## 2017-06-22 DIAGNOSIS — G4733 Obstructive sleep apnea (adult) (pediatric): Secondary | ICD-10-CM | POA: Diagnosis not present

## 2017-06-23 DIAGNOSIS — G4733 Obstructive sleep apnea (adult) (pediatric): Secondary | ICD-10-CM | POA: Diagnosis not present

## 2017-11-25 DIAGNOSIS — J069 Acute upper respiratory infection, unspecified: Secondary | ICD-10-CM | POA: Diagnosis not present

## 2017-11-29 ENCOUNTER — Encounter (HOSPITAL_COMMUNITY): Payer: Self-pay | Admitting: Emergency Medicine

## 2017-11-29 ENCOUNTER — Emergency Department (HOSPITAL_COMMUNITY): Payer: BLUE CROSS/BLUE SHIELD

## 2017-11-29 ENCOUNTER — Emergency Department (HOSPITAL_COMMUNITY)
Admission: EM | Admit: 2017-11-29 | Discharge: 2017-11-30 | Disposition: A | Discharge status: Home / Self Care | Payer: BLUE CROSS/BLUE SHIELD | Attending: Emergency Medicine | Admitting: Emergency Medicine

## 2017-11-29 DIAGNOSIS — R05 Cough: Secondary | ICD-10-CM | POA: Diagnosis not present

## 2017-11-29 DIAGNOSIS — J069 Acute upper respiratory infection, unspecified: Secondary | ICD-10-CM | POA: Diagnosis not present

## 2017-11-29 DIAGNOSIS — R509 Fever, unspecified: Secondary | ICD-10-CM | POA: Diagnosis not present

## 2017-11-29 DIAGNOSIS — R42 Dizziness and giddiness: Secondary | ICD-10-CM | POA: Diagnosis not present

## 2017-11-29 DIAGNOSIS — Z7982 Long term (current) use of aspirin: Secondary | ICD-10-CM | POA: Insufficient documentation

## 2017-11-29 DIAGNOSIS — F329 Major depressive disorder, single episode, unspecified: Secondary | ICD-10-CM | POA: Insufficient documentation

## 2017-11-29 DIAGNOSIS — F909 Attention-deficit hyperactivity disorder, unspecified type: Secondary | ICD-10-CM | POA: Diagnosis not present

## 2017-11-29 DIAGNOSIS — Z79899 Other long term (current) drug therapy: Secondary | ICD-10-CM | POA: Diagnosis not present

## 2017-11-29 LAB — BASIC METABOLIC PANEL
ANION GAP: 10 (ref 5–15)
BUN: 12 mg/dL (ref 6–20)
CALCIUM: 8.9 mg/dL (ref 8.9–10.3)
CO2: 26 mmol/L (ref 22–32)
Chloride: 103 mmol/L (ref 101–111)
Creatinine, Ser: 1.07 mg/dL (ref 0.61–1.24)
GFR calc non Af Amer: >60 mL/min (ref >60)
GLUCOSE: 95 mg/dL (ref 65–99)
POTASSIUM: 3.6 mmol/L (ref 3.5–5.1)
Sodium: 139 mmol/L (ref 135–145)

## 2017-11-29 LAB — CBC WITH DIFFERENTIAL/PLATELET
BASOS ABS: 0 10*3/uL (ref 0.0–0.1)
BASOS PCT: 0 %
Eosinophils Absolute: 0.2 10*3/uL (ref 0.0–0.7)
Eosinophils Relative: 4 %
HCT: 39.6 % (ref 39.0–52.0)
Hemoglobin: 14.2 g/dL (ref 13.0–17.0)
LYMPHS PCT: 30 %
Lymphs Abs: 1.7 10*3/uL (ref 0.7–4.0)
MCH: 32.1 pg (ref 26.0–34.0)
MCHC: 35.9 g/dL (ref 30.0–36.0)
MCV: 89.6 fL (ref 78.0–100.0)
Monocytes Absolute: 0.8 10*3/uL (ref 0.1–1.0)
Monocytes Relative: 14 %
NEUTROS ABS: 3 10*3/uL (ref 1.7–7.7)
Neutrophils Relative %: 52 %
Platelets: 230 10*3/uL (ref 150–400)
RBC: 4.42 MIL/uL (ref 4.22–5.81)
RDW: 12.9 % (ref 11.5–15.5)
WBC: 5.7 10*3/uL (ref 4.0–10.5)

## 2017-11-29 MED ORDER — BENZONATATE 100 MG PO CAPS: 100.0000 mg | ORAL_CAPSULE | Freq: Three times a day (TID) | ORAL | 0 refills | Status: DC

## 2017-11-29 NOTE — ED Triage Notes (Addendum)
Pt c/o cough and congestion for several days. Pt reports intermittent fever, c/o dizziness since yesterday since taking the otc mucinex. Pt also taking ibuprofen for fever. Pt with productive cough and seen by MD on 11/25/17 dx with viral URI.

## 2017-11-29 NOTE — ED Provider Notes (Signed)
[name] Note   CSN: [id number] Arrival date & time: 11/29/17  1414     History   Chief Complaint Chief Complaint  Patient presents with  . Cough  . Dizziness    HPI Jeffery Underwood is a 62 y.o. male.  HPI   Patient is a 62 year old male with history of ADHD, depression, asthma, hyper lipidemia, OSA, who presents the ED complaining of a productive cough with yellow/green sputum and chest congestion for several days.  Also reports intermittent fevers at night.  Also complaining of dizziness/lightheadedness that began yesterday after starting over-the-counter Mucinex.  Reported that he initially had feeling of room spinning yesterday, however today had some lightheadedness upon standing.  All symptoms of dizziness and lightheadedness have resolved spontaneously since he arrived to the emergency department.  Was able to walk back to ER bed without dizziness.  Now states he is most concerned with his cough.  Also noted to have some high blood pressures at home.  No chest pain, shortness of breath, or decreased urine output.  No headache no vision changes.  No numbness, weakness, tingling to the bilateral upper or lower extremities.  No slurred speech, no confusion, no ear fullness.  Has slight sore throat which is improving.  Was seen by PCP and diagnosed with viral illness.  Patient has been taking Sudafed at home.  Past Medical History:  Diagnosis Date  . ADHD (attention deficit hyperactivity disorder)   . Asthma   . Depression   . Hypertriglyceridemia   . OSA (obstructive sleep apnea)   . Prediabetes 2006   resolved 10/2011  . Psoriasis   . Seasonal allergies     Patient Active Problem List   Diagnosis Date Noted  . OSA (obstructive sleep apnea) 09/22/2013    Past Surgical History:  Procedure Laterality Date  . INGUINAL HERNIA REPAIR     right side (4-5 years ago)       Home Medications    Prior to Admission medications     Medication Sig Start Date End Date Taking? Authorizing Provider  albuterol (PROVENTIL HFA;VENTOLIN HFA) 108 (90 BASE) MCG/ACT inhaler Inhale 2 puffs into the lungs as needed for wheezing or shortness of breath.   Yes [provider]  amphetamine-dextroamphetamine (ADDERALL XR) 30 MG 24 hr capsule Take 45 mg by mouth every morning.    Yes [provider]  aspirin 81 MG tablet Take 81 mg by mouth daily.   Yes [provider]  budesonide-formoterol (SYMBICORT) 160-4.5 MCG/ACT inhaler Inhale 2 puffs into the lungs 2 (two) times daily.   Yes [provider]  buPROPion (WELLBUTRIN XL) 150 MG 24 hr tablet Take 150 mg by mouth daily. 11/08/14  Yes [provider]  cetirizine (ZYRTEC) 10 MG tablet Take 10 mg by mouth daily.   Yes [provider]  FLUoxetine (PROZAC) 10 MG capsule Take 20 mg by mouth daily.    Yes [provider]  fluticasone (FLONASE) 50 MCG/ACT nasal spray Place 1 spray into both nostrils 2 (two) times daily. 11/18/17  Yes [provider]  Ketotifen Fumarate (ZADITOR OP) Place 1 drop into both eyes 2 (two) times daily.   Yes [provider]  Multiple Vitamins-Minerals (MULTIVITAMIN PO) Take 1 tablet by mouth daily.    Yes [provider]  benzonatate (TESSALON) 100 MG capsule Take 1 capsule (100 mg total) by mouth every 8 (eight) hours. 11/29/17   Jeffery Donaho Chauncey Cruel, PA-C    Family History  Family History  Problem Relation Age of Onset  . Depression Mother   . Tuberculosis Mother   . Osteoporosis Mother   . Diabetes type II Father     Social History Social History   Tobacco Use  . Smoking status: Never Smoker  . Smokeless tobacco: Never Used  Substance Use Topics  . Alcohol use: Yes    Alcohol/week: 8.4 oz    Types: 14 Cans of beer per week    Comment: 3-5 per week  . Drug use: No     Allergies   Patient has no known allergies.   Review of Systems Review of Systems   Constitutional: Positive for fever.  HENT: Positive for congestion, rhinorrhea and sore throat. Negative for ear pain and trouble swallowing.   Eyes: Negative for visual disturbance.  Respiratory: Positive for cough. Negative for shortness of breath and wheezing.   Cardiovascular: Negative for chest pain, palpitations and leg swelling.  Gastrointestinal: Negative for abdominal pain, constipation, diarrhea, nausea and vomiting.  Genitourinary: Negative for dysuria, flank pain, frequency, hematuria and urgency.  Musculoskeletal: Negative for back pain and neck pain.  Skin: Negative for rash.  Neurological: Positive for dizziness and light-headedness. Negative for speech difficulty, numbness and headaches.     Physical Exam Updated Vital Signs BP (!) 161/83 (BP Location: Left Arm)   Pulse 72   Temp 98.3 F (36.8 C) (Oral)   Resp 19   SpO2 94%   Physical Exam  Constitutional: He appears well-developed and well-nourished.  HENT:  Head: Normocephalic and atraumatic.  Right Ear: External ear normal.  Left Ear: External ear normal.  Nose: Nose normal.  Mouth/Throat: Oropharynx is clear and moist.  Mild pharyngeal erythema.  No tonsillar swelling or exudates.  Uvula midline.  Normal voice.  Eyes: Conjunctivae and EOM are normal. Pupils are equal, round, and reactive to light.  Neck: Normal range of motion. Neck supple.  No nuchal rigidity.  Cardiovascular: Normal rate, regular rhythm, normal heart sounds and intact distal pulses.  No murmur heard. Pulmonary/Chest: Effort normal and breath sounds normal. No respiratory distress. He has no wheezes.  Abdominal: Soft. Bowel sounds are normal. He exhibits no distension. There is no tenderness.  Musculoskeletal: He exhibits no edema.  Neurological: He is alert.  Mental Status:  Alert, thought content appropriate, able to give a coherent history. Speech fluent without evidence of aphasia. Able to follow 2 step commands without difficulty.   Cranial Nerves:  II:   pupils equal, round, reactive to light III,IV, VI: ptosis not present, extra-ocular motions intact bilaterally  V,VII: smile symmetric, facial light touch sensation equal VIII: hearing grossly normal to voice  X: uvula elevates symmetrically  XI: bilateral shoulder shrug symmetric and strong XII: midline tongue extension without fassiculations Motor:  Normal tone. 5/5 strength of BUE and BLE major muscle groups including strong and equal grip strength and dorsiflexion/plantar flexion Sensory: light touch normal in all extremities. DTRs: patellar DTRs 2+ symmetric b/l Cerebellar: normal finger-to-nose with bilateral upper extremities. Normal heel to shin bilaterally Gait: normal gait and balance. Able to walk on toes and heels with ease.  CV: 2+ radial and DP/PT pulses No pronator drift.  Negative Romberg.  Skin: Skin is warm and dry.  Psychiatric: He has a normal mood and affect.  Nursing note and vitals reviewed.    ED Treatments / Results  Labs (all labs ordered are listed, but only abnormal results are displayed) Labs Reviewed  CBC WITH DIFFERENTIAL/PLATELET  BASIC METABOLIC PANEL  EKG  EKG Interpretation  Date/Time:  Monday November 29 2017 22:45:02 EDT Ventricular Rate:  71 PR Interval:    QRS Duration: 102 QT Interval:  412 QTC Calculation: 448 R Axis:   -12 Text Interpretation:  Sinus rhythm Abnormal R-wave progression, early transition No significant change since last tracing Confirmed by Duffy Bruce [phone]) on 11/29/2017 11:00:25 PM       Radiology Dg Chest 2 View  Result Date: 11/29/2017 CLINICAL DATA:  Cough and congestion.  Intermittent fever EXAM: CHEST - 2 VIEW COMPARISON:  03/27/2012 FINDINGS: Normal heart size and mediastinal contours. No acute infiltrate or edema. No effusion or pneumothorax. No acute osseous findings. IMPRESSION: Negative chest. Electronically Signed   By: Monte Fantasia M.D.   On: 11/29/2017 15:56     Procedures Procedures (including critical care time)  Medications Ordered in ED Medications - No data to display   Initial Impression / Assessment and Plan / ED Course  I have reviewed the triage vital signs and the nursing notes.  Pertinent labs & imaging results that were available during my care of the patient were reviewed by me and considered in my medical decision making (see chart for details).    Discussed pt presentation, exam findings, and workup with Dr. Ellender Hose, who agrees with the plan to discharge pt with symptomatic tx and f/u as outpt.  Final Clinical Impressions(s) / ED Diagnoses   Final diagnoses:  Upper respiratory tract infection, unspecified type   Pt CXR negative for acute infiltrate. Labs unremarkable.  No focal neurologic deficits on exam.  Dizziness/lightheadedness completely resolved prior to me seeing the patient.  Suspect that the symptoms due to side effect of using Mucinex.  Patients symptoms are consistent with URI, likely viral etiology. Discussed that antibiotics are not indicated for viral infections. Pt will be discharged with symptomatic treatment.  Verbalizes understanding and is agreeable with plan. Pt is hemodynamically stable & in NAD prior to dc.  Patient's blood pressure elevated in the emergency department today. Patient denies headache, change in vision, numbness, weakness, chest pain, dyspnea, dizziness, or lightheadedness therefore doubt hypertensive emergency. ECG with NSR HR 71. No st elevations or depression suggesting ischemia. Unchanged from previous.  Discussed elevated blood pressure with the patient and the need for primary care follow up in2 days for BP recheck. Discussed potential need to initiate or change antihypertensive medications and or for further evaluation. Discussed return precaution signs/symptoms for hypertensive emergency as listed above with the patient. He and his wife confirmed understanding.    ED Discharge Orders         Ordered    benzonatate (TESSALON) 100 MG capsule  Every 8 hours     11/29/17 2359       Rodney Booze, PA-C 11/30/17 0147    Duffy Bruce, MD 11/30/17 1122

## 2017-11-29 NOTE — Discharge Instructions (Addendum)
You were given a prescription for benzonatate which you may take daily for your cough.  This medication may make you sedated so you will need to avoid driving, working, or operating machinery while you are taking it.  Please check with your pharmacist to make sure that this drug does not interact with any of the other medications that you are currently on.  If you have adverse side effects from this medicine you should stop taking it immediately.  You were noted to have high blood pressure today during your visit in the emergency department. If you experience any chest pain, shortness of breath, headaches, vision changes, numbness, weakness, lightheadedness, changes in mental status, or decrease in urination you should return to the emergency department immediately.

## 2017-12-06 DIAGNOSIS — J069 Acute upper respiratory infection, unspecified: Secondary | ICD-10-CM | POA: Diagnosis not present

## 2017-12-06 DIAGNOSIS — R42 Dizziness and giddiness: Secondary | ICD-10-CM | POA: Diagnosis not present

## 2018-04-18 DIAGNOSIS — G4733 Obstructive sleep apnea (adult) (pediatric): Secondary | ICD-10-CM | POA: Diagnosis not present

## 2018-05-03 DIAGNOSIS — Z125 Encounter for screening for malignant neoplasm of prostate: Secondary | ICD-10-CM | POA: Diagnosis not present

## 2018-05-03 DIAGNOSIS — E781 Pure hyperglyceridemia: Secondary | ICD-10-CM | POA: Diagnosis not present

## 2018-05-03 DIAGNOSIS — Z0000 Encounter for general adult medical examination without abnormal findings: Secondary | ICD-10-CM | POA: Diagnosis not present

## 2018-05-03 DIAGNOSIS — Z23 Encounter for immunization: Secondary | ICD-10-CM | POA: Diagnosis not present

## 2018-05-03 DIAGNOSIS — Z79899 Other long term (current) drug therapy: Secondary | ICD-10-CM | POA: Diagnosis not present

## 2018-11-15 DIAGNOSIS — D0439 Carcinoma in situ of skin of other parts of face: Secondary | ICD-10-CM | POA: Diagnosis not present

## 2018-11-15 DIAGNOSIS — D485 Neoplasm of uncertain behavior of skin: Secondary | ICD-10-CM | POA: Diagnosis not present

## 2018-12-28 DIAGNOSIS — R05 Cough: Secondary | ICD-10-CM | POA: Diagnosis not present

## 2019-02-23 DIAGNOSIS — D0439 Carcinoma in situ of skin of other parts of face: Secondary | ICD-10-CM | POA: Diagnosis not present

## 2019-02-23 DIAGNOSIS — D485 Neoplasm of uncertain behavior of skin: Secondary | ICD-10-CM | POA: Diagnosis not present

## 2019-05-03 DIAGNOSIS — G4733 Obstructive sleep apnea (adult) (pediatric): Secondary | ICD-10-CM | POA: Diagnosis not present

## 2019-05-17 DIAGNOSIS — Z79899 Other long term (current) drug therapy: Secondary | ICD-10-CM | POA: Diagnosis not present

## 2019-05-17 DIAGNOSIS — Z23 Encounter for immunization: Secondary | ICD-10-CM | POA: Diagnosis not present

## 2019-05-17 DIAGNOSIS — E781 Pure hyperglyceridemia: Secondary | ICD-10-CM | POA: Diagnosis not present

## 2019-05-17 DIAGNOSIS — Z0000 Encounter for general adult medical examination without abnormal findings: Secondary | ICD-10-CM | POA: Diagnosis not present

## 2019-05-17 DIAGNOSIS — Z1159 Encounter for screening for other viral diseases: Secondary | ICD-10-CM | POA: Diagnosis not present

## 2019-05-17 DIAGNOSIS — T148XXA Other injury of unspecified body region, initial encounter: Secondary | ICD-10-CM | POA: Diagnosis not present

## 2019-05-19 ENCOUNTER — Other Ambulatory Visit: Payer: Self-pay | Admitting: Family Medicine

## 2019-05-19 DIAGNOSIS — R0989 Other specified symptoms and signs involving the circulatory and respiratory systems: Secondary | ICD-10-CM

## 2019-05-23 ENCOUNTER — Ambulatory Visit
Admission: RE | Admit: 2019-05-23 | Discharge: 2019-05-23 | Disposition: A | Discharge status: Home / Self Care | Payer: BC Managed Care – PPO | Source: Ambulatory Visit | Attending: Family Medicine | Admitting: Family Medicine

## 2019-05-23 DIAGNOSIS — I6521 Occlusion and stenosis of right carotid artery: Secondary | ICD-10-CM | POA: Diagnosis not present

## 2019-05-23 DIAGNOSIS — R0989 Other specified symptoms and signs involving the circulatory and respiratory systems: Secondary | ICD-10-CM

## 2019-06-08 DIAGNOSIS — G4733 Obstructive sleep apnea (adult) (pediatric): Secondary | ICD-10-CM | POA: Diagnosis not present

## 2019-07-04 DIAGNOSIS — L570 Actinic keratosis: Secondary | ICD-10-CM | POA: Diagnosis not present

## 2019-07-04 DIAGNOSIS — L821 Other seborrheic keratosis: Secondary | ICD-10-CM | POA: Diagnosis not present

## 2019-07-28 DIAGNOSIS — Z23 Encounter for immunization: Secondary | ICD-10-CM | POA: Diagnosis not present

## 2019-08-14 DIAGNOSIS — H00022 Hordeolum internum right lower eyelid: Secondary | ICD-10-CM | POA: Diagnosis not present

## 2019-08-26 DIAGNOSIS — F3342 Major depressive disorder, recurrent, in full remission: Secondary | ICD-10-CM | POA: Diagnosis not present

## 2019-09-03 IMAGING — CR DG CHEST 2V
2 series · 2 of 2 positions shown · non-contrast
Comparison: 03/27/2012

CLINICAL DATA: Cough and congestion.  Intermittent fever

EXAM:
CHEST - 2 VIEW

[w chest pa]
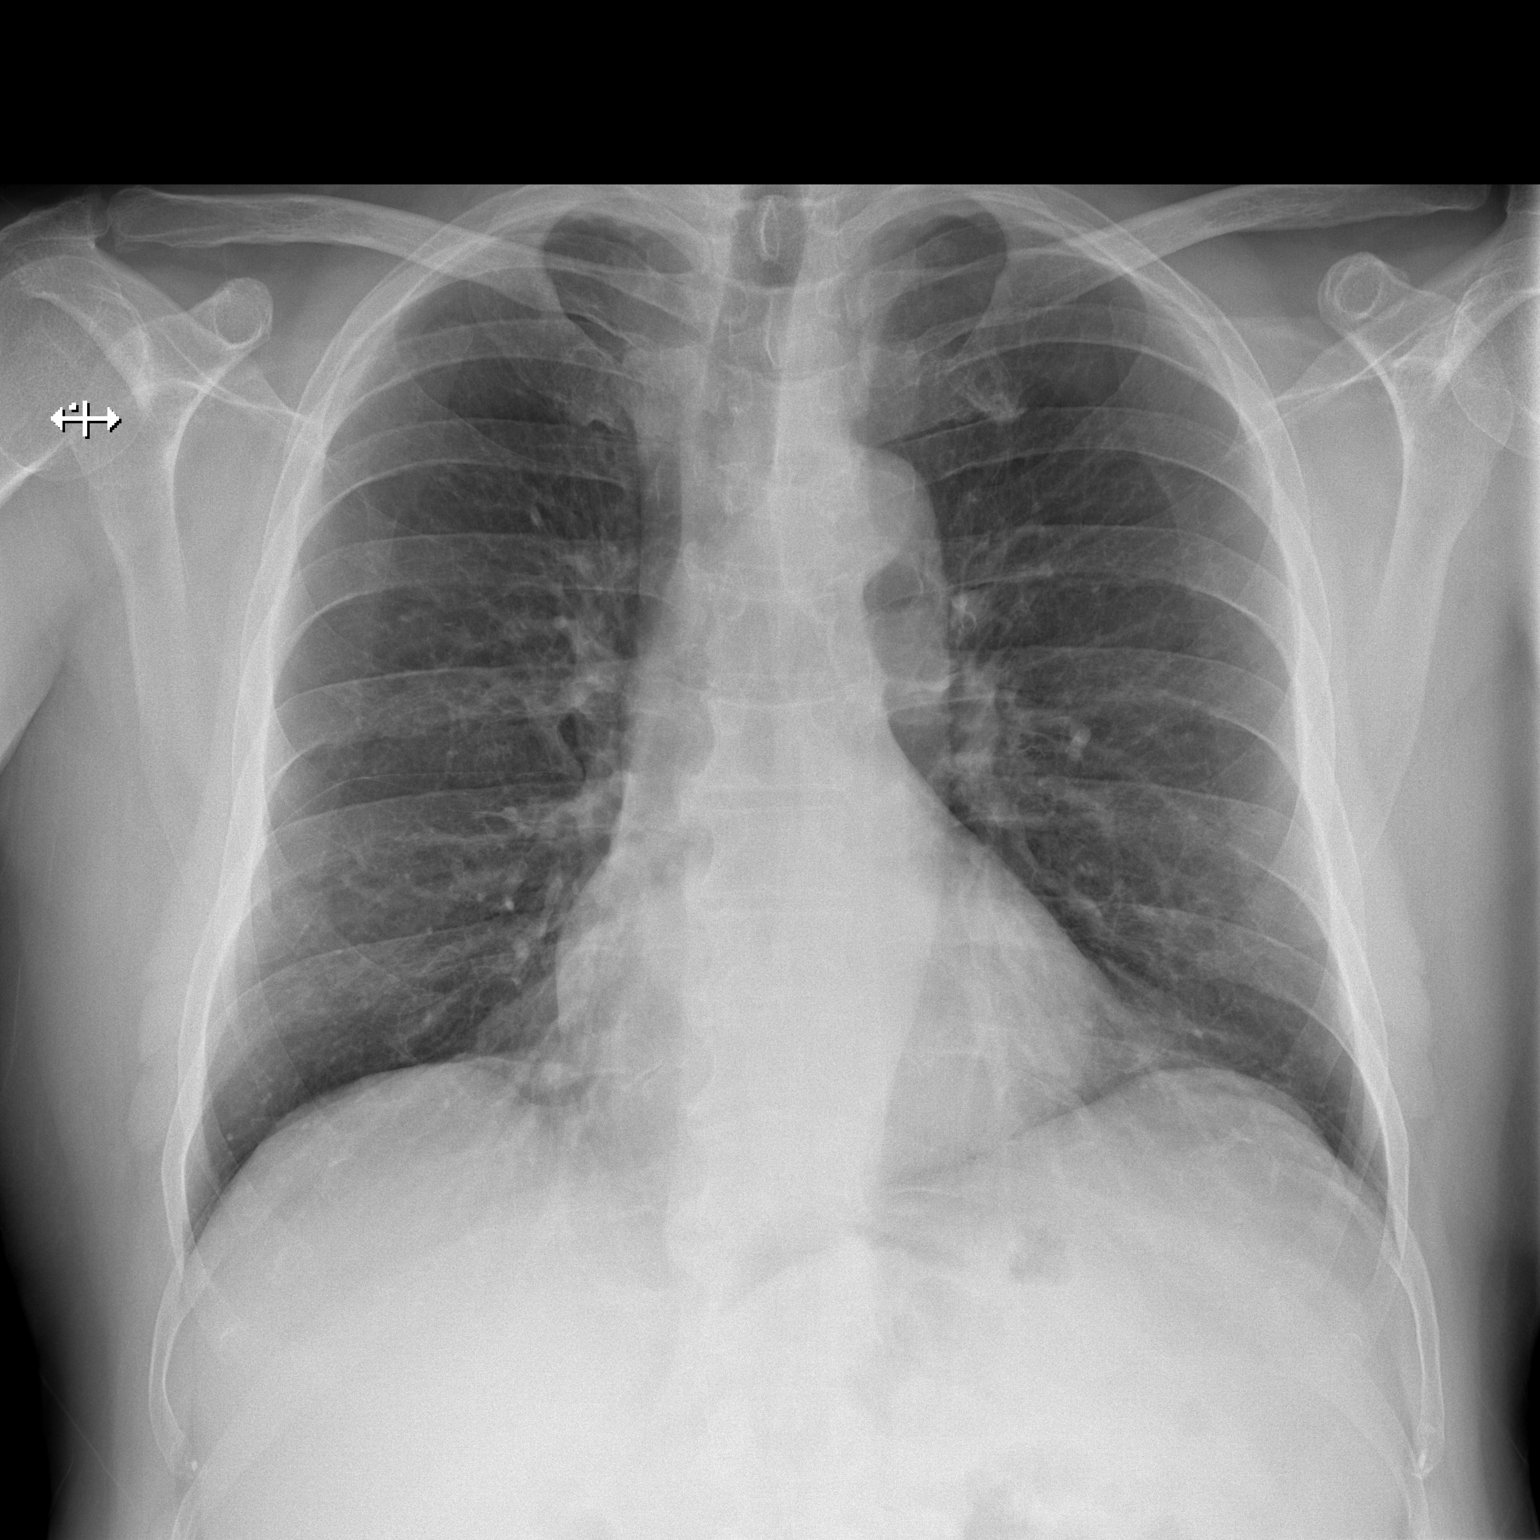

[w chest lat]
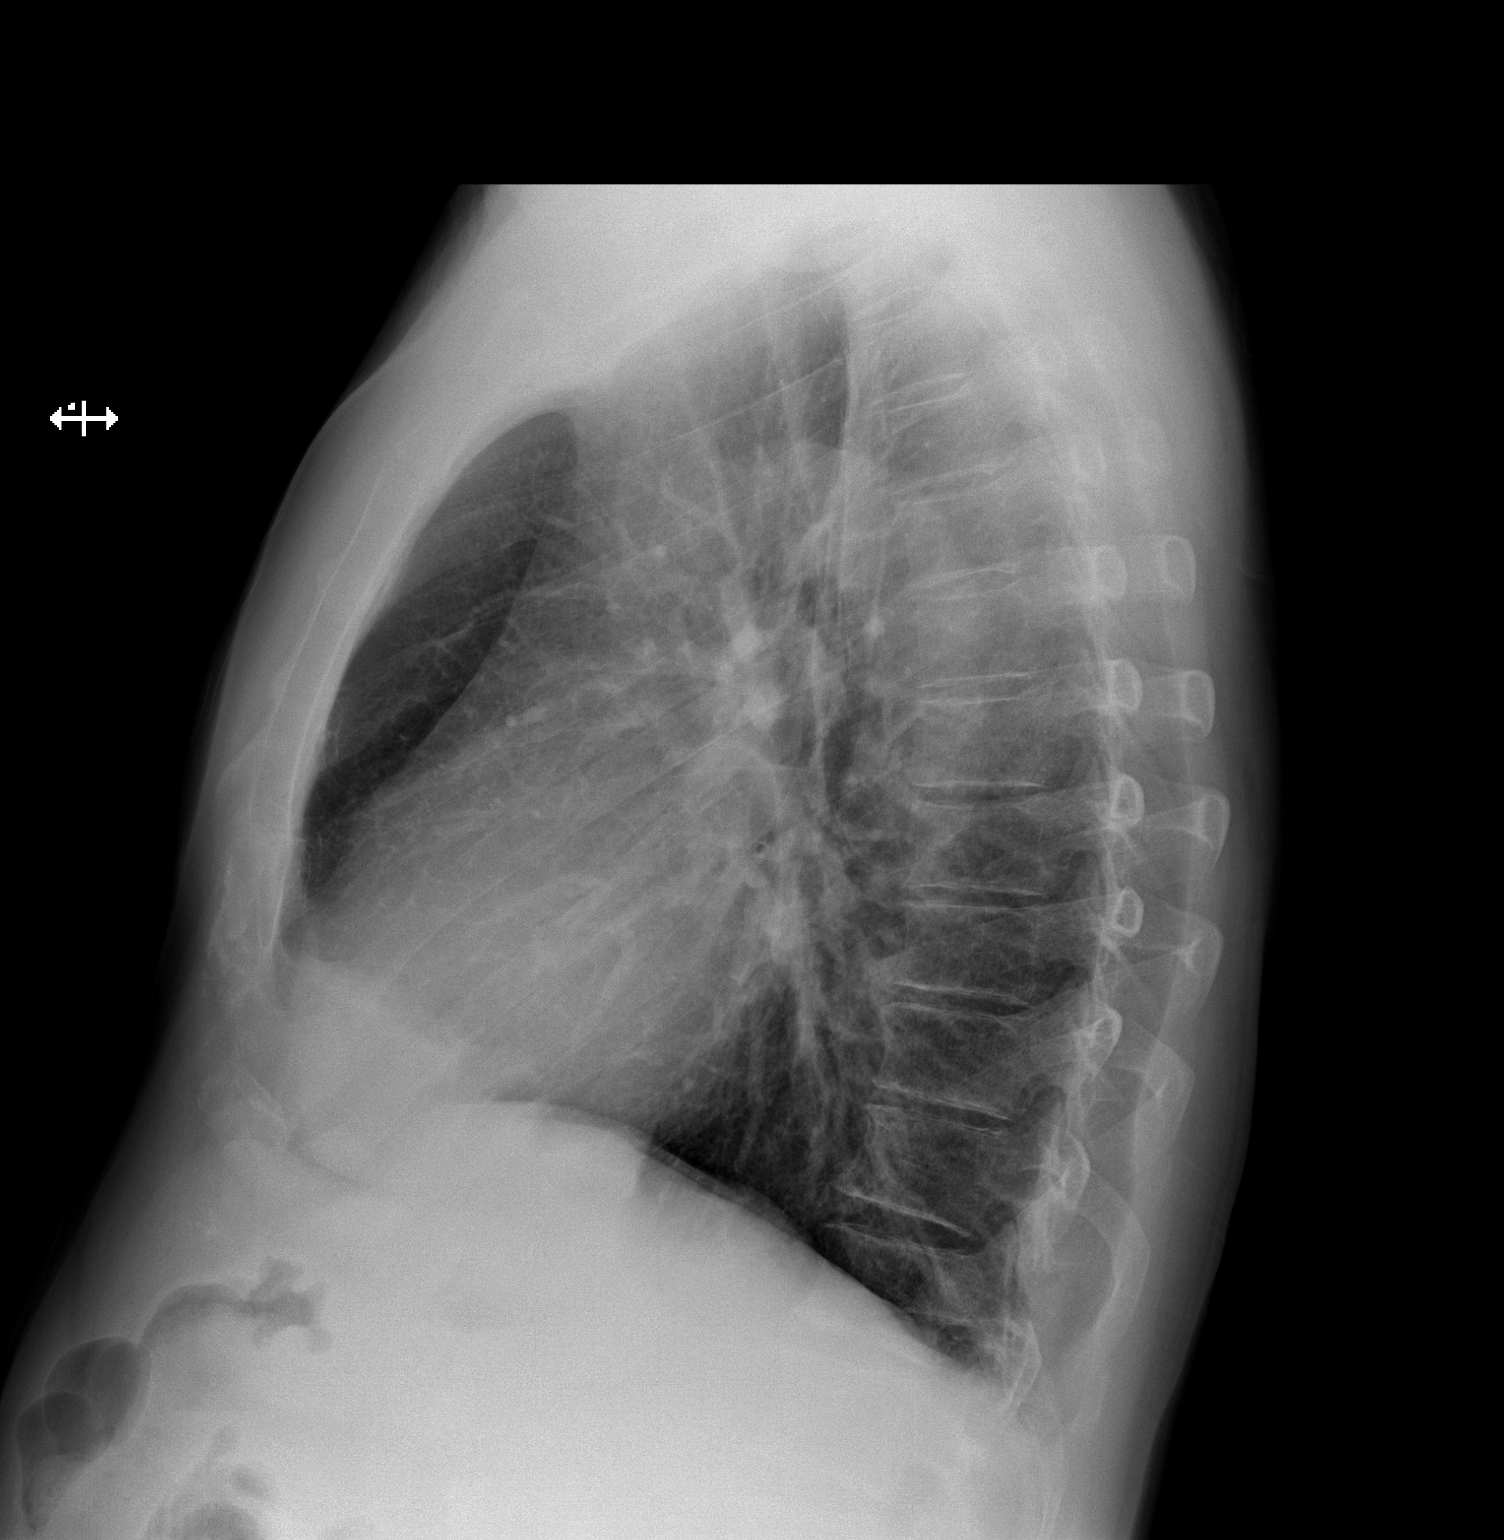

[2 of 2 positions shown; findings below may reference images not displayed]

FINDINGS: Normal heart size and mediastinal contours. No acute infiltrate or
edema. No effusion or pneumothorax. No acute osseous findings.
IMPRESSION: Negative chest.

## 2019-09-06 DIAGNOSIS — G4733 Obstructive sleep apnea (adult) (pediatric): Secondary | ICD-10-CM | POA: Diagnosis not present

## 2020-08-21 DIAGNOSIS — Z20822 Contact with and (suspected) exposure to covid-19: Secondary | ICD-10-CM | POA: Diagnosis not present

## 2020-08-21 DIAGNOSIS — U071 COVID-19: Secondary | ICD-10-CM | POA: Diagnosis not present

## 2020-08-24 ENCOUNTER — Telehealth (HOSPITAL_COMMUNITY): Payer: Self-pay | Admitting: Family

## 2020-08-24 DIAGNOSIS — R69 Illness, unspecified: Secondary | ICD-10-CM

## 2020-08-24 NOTE — Telephone Encounter (Signed)
Called to discuss with Deniece Ree about Covid symptoms and potential candidacy for the use of sotrovimab, a combination monoclonal antibody infusion for those with mild to moderate Covid symptoms and at a high risk of hospitalization.     Pt is qualified for this infusion at the infusion center due to co-morbid conditions and/or a member of an at-risk group, however unable to reach patient. VM left.   Nicolasa Milbrath,NP

## 2020-08-25 ENCOUNTER — Other Ambulatory Visit (HOSPITAL_COMMUNITY): Payer: Self-pay | Admitting: Family

## 2020-08-25 DIAGNOSIS — J45909 Unspecified asthma, uncomplicated: Secondary | ICD-10-CM | POA: Diagnosis not present

## 2020-08-25 DIAGNOSIS — U071 COVID-19: Secondary | ICD-10-CM

## 2020-08-25 DIAGNOSIS — Z9189 Other specified personal risk factors, not elsewhere classified: Secondary | ICD-10-CM | POA: Diagnosis not present

## 2020-08-25 NOTE — Progress Notes (Signed)
I connected by phone with Jeffery Underwood on 08/25/2020 at 11:32 AM to discuss the potential use of a new treatment for mild to moderate COVID-19 viral infection in non-hospitalized patients.  This patient is a 64 y.o. male that meets the FDA criteria for Emergency Use Authorization of COVID monoclonal antibody casirivimab/imdevimab, bamlanivimab/eteseviamb, or sotrovimab.  Has a (+) direct SARS-CoV-2 viral test result  Has mild or moderate COVID-19   Is NOT hospitalized due to COVID-19  Is within 10 days of symptom onset  Has at least one of the high risk factor(s) for progression to severe COVID-19 and/or hospitalization as defined in EUA.  Specific high risk criteria : Older age (>/= 65 yo) and Chronic Lung Disease   Symptoms of fever, aches, cough, SOB, began 08/20/20.    I have spoken and communicated the following to the patient or parent/caregiver regarding COVID monoclonal antibody treatment:  1. FDA has authorized the emergency use for the treatment of mild to moderate COVID-19 in adults and pediatric patients with positive results of direct SARS-CoV-2 viral testing who are 65 years of age and older weighing at least 40 kg, and who are at high risk for progressing to severe COVID-19 and/or hospitalization.  2. The significant known and potential risks and benefits of COVID monoclonal antibody, and the extent to which such potential risks and benefits are unknown.  3. Information on available alternative treatments and the risks and benefits of those alternatives, including clinical trials.  4. Patients treated with COVID monoclonal antibody should continue to self-isolate and use infection control measures (e.g., wear mask, isolate, social distance, avoid sharing personal items, clean and disinfect "high touch" surfaces, and frequent handwashing) according to CDC guidelines.   5. The patient or parent/caregiver has the option to accept or refuse COVID monoclonal antibody  treatment.  After reviewing this information with the patient, the patient has agreed to receive one of the available covid 19 monoclonal antibodies and will be provided an appropriate fact sheet prior to infusion. [name], NP 08/25/2020 11:32 AM

## 2020-08-26 DIAGNOSIS — U071 COVID-19: Secondary | ICD-10-CM | POA: Diagnosis not present

## 2020-08-27 ENCOUNTER — Other Ambulatory Visit (HOSPITAL_COMMUNITY): Payer: Self-pay

## 2020-08-27 ENCOUNTER — Ambulatory Visit (HOSPITAL_COMMUNITY)
Admission: RE | Admit: 2020-08-27 | Discharge: 2020-08-27 | Disposition: A | Discharge status: Home / Self Care | Payer: Medicare Other | Source: Ambulatory Visit | Attending: Pulmonary Disease | Admitting: Pulmonary Disease

## 2020-08-27 DIAGNOSIS — U071 COVID-19: Secondary | ICD-10-CM

## 2020-08-27 DIAGNOSIS — Z23 Encounter for immunization: Secondary | ICD-10-CM | POA: Diagnosis not present

## 2020-08-27 MED ORDER — DIPHENHYDRAMINE HCL 50 MG/ML IJ SOLN: 50.0000 mg | Freq: Once | INTRAMUSCULAR | Status: DC | PRN

## 2020-08-27 MED ORDER — SODIUM CHLORIDE 0.9 % IV SOLN: Freq: Once | INTRAVENOUS | Status: AC

## 2020-08-27 MED ORDER — ALBUTEROL SULFATE HFA 108 (90 BASE) MCG/ACT IN AERS: 2.0000 | INHALATION_SPRAY | Freq: Once | RESPIRATORY_TRACT | Status: DC | PRN

## 2020-08-27 MED ORDER — FAMOTIDINE IN NACL 20-0.9 MG/50ML-% IV SOLN: 20.0000 mg | Freq: Once | INTRAVENOUS | Status: DC | PRN

## 2020-08-27 MED ORDER — SODIUM CHLORIDE 0.9 % IV SOLN: INTRAVENOUS | Status: DC | PRN

## 2020-08-27 MED ORDER — METHYLPREDNISOLONE SODIUM SUCC 125 MG IJ SOLR: 125.0000 mg | Freq: Once | INTRAMUSCULAR | Status: DC | PRN

## 2020-08-27 MED ORDER — EPINEPHRINE 0.3 MG/0.3ML IJ SOAJ: 0.3000 mg | Freq: Once | INTRAMUSCULAR | Status: DC | PRN

## 2020-08-27 NOTE — Progress Notes (Signed)
Patient reviewed Fact Sheet for Patients, Parents, and Caregivers for Emergency Use Authorization (EUA) of Sotrovimab for the Treatment of Coronavirus. Patient also reviewed and is agreeable to the estimated cost of treatment. Patient is agreeable to proceed.   

## 2020-08-27 NOTE — Discharge Instructions (Signed)
10 Things You Can Do to Manage Your COVID-19 Symptoms at Home If you have possible or confirmed COVID-19: 1. Stay home from work and school. And stay away from other public places. If you must go out, avoid using any kind of public transportation, ridesharing, or taxis. 2. Monitor your symptoms carefully. If your symptoms get worse, call your healthcare provider immediately. 3. Get rest and stay hydrated. 4. If you have a medical appointment, call the healthcare provider ahead of time and tell them that you have or may have COVID-19. 5. For medical emergencies, call 911 and notify the dispatch personnel that you have or may have COVID-19. 6. Cover your cough and sneezes with a tissue or use the inside of your elbow. 7. Wash your hands often with soap and water for at least 20 seconds or clean your hands with an alcohol-based hand sanitizer that contains at least 60% alcohol. 8. As much as possible, stay in a specific room and away from other people in your home. Also, you should use a separate bathroom, if available. If you need to be around other people in or outside of the home, wear a mask. 9. Avoid sharing personal items with other people in your household, like dishes, towels, and bedding. 10. Clean all surfaces that are touched often, like counters, tabletops, and doorknobs. Use household cleaning sprays or wipes according to the label instructions. cdc.gov/coronavirus 03/22/2019 This information is not intended to replace advice given to you by your health care provider. Make sure you discuss any questions you have with your health care provider. Document Revised: 08/24/2019 Document Reviewed: 08/24/2019 Elsevier Patient Education  2020 [employer] Inc[employer] What types of side effects do monoclonal antibody drugs cause?  Common side effects  In general, the more common side effects caused by monoclonal antibody drugs include: . Allergic reactions, such as hives or itching . Flu-like signs and  symptoms, including chills, fatigue, fever, and muscle aches and pains . Nausea, vomiting . Diarrhea . Skin rashes . Low blood pressure   The CDC is recommending patients who receive monoclonal antibody treatments wait at least 90 days before being vaccinated.  Currently, there are no data on the safety and efficacy of mRNA COVID-19 vaccines in persons who received monoclonal antibodies or convalescent plasma as part of COVID-19 treatment. Based on the estimated half-life of such therapies as well as evidence suggesting that reinfection is uncommon in the 90 days after initial infection, vaccination should be deferred for at least 90 days, as a precautionary measure until additional information becomes available, to avoid interference of the antibody treatment with vaccine-induced immune responses. If you have any questions or concerns after the infusion please call the Advanced Practice Provider on call at [phone]. This number is ONLY intended for your use regarding questions or concerns about the infusion post-treatment side-effects.  Please do not provide this number to others for use. For return to work notes please contact your primary care provider.   If someone you know is interested in receiving treatment please have them call the COVID hotline at [phone].   

## 2020-08-27 NOTE — Progress Notes (Signed)
  Diagnosis: COVID-19  Physician: Dr. Joya Gaskins  Procedure: [facility] Infusion [facility] Med: casirivimab\imdevimab infusion - Provided patient with casirivimab\imdevimab fact sheet for patients, parents and caregivers prior to infusion.  Complications: No immediate complications noted.  Discharge: Discharged home   Jeffery Underwood 08/27/2020

## 2020-08-28 ENCOUNTER — Ambulatory Visit (HOSPITAL_COMMUNITY): Payer: BC Managed Care – PPO

## 2020-08-30 DIAGNOSIS — H00025 Hordeolum internum left lower eyelid: Secondary | ICD-10-CM | POA: Diagnosis not present

## 2021-01-16 DIAGNOSIS — M67911 Unspecified disorder of synovium and tendon, right shoulder: Secondary | ICD-10-CM | POA: Diagnosis not present

## 2021-02-24 IMAGING — US US CAROTID DUPLEX BILAT
1 series · 13 of 24 positions shown · non-contrast
Comparison: None.

CLINICAL DATA: Right-sided carotid.

EXAM:
BILATERAL CAROTID DUPLEX ULTRASOUND
TECHNIQUE: Gray scale imaging, color Doppler and duplex ultrasound were
performed of bilateral carotid and vertebral arteries in the neck.

[Series 1: us carotid duplex bilat · 0.06mm/px · 13 of 70 slices shown]
[im 1/70]
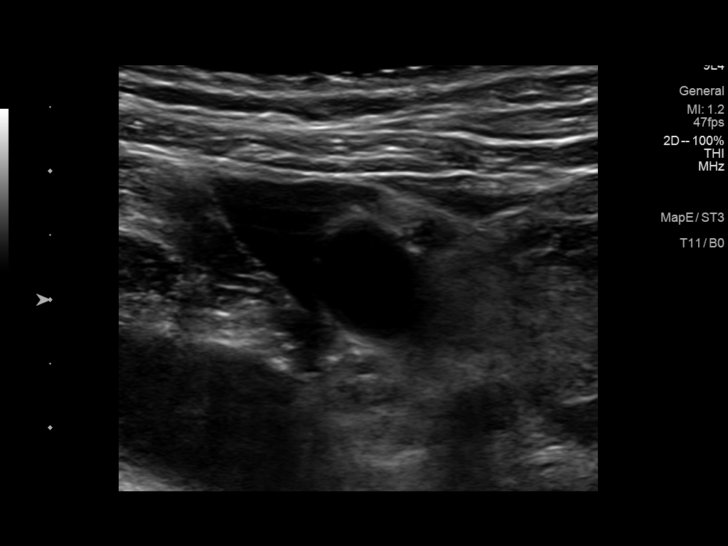
[im 7/70]
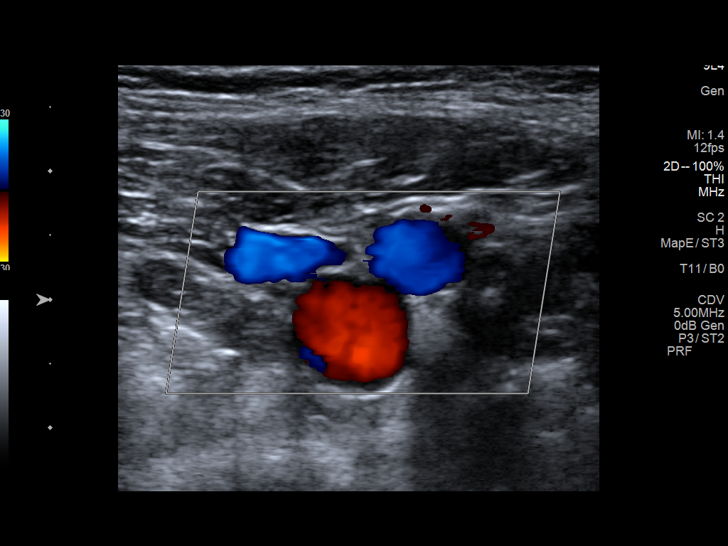
[im 13/70]
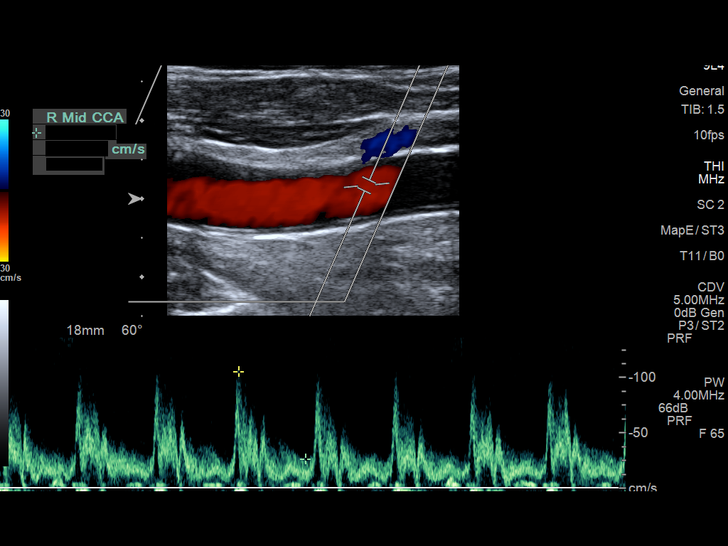
[im 19/70]
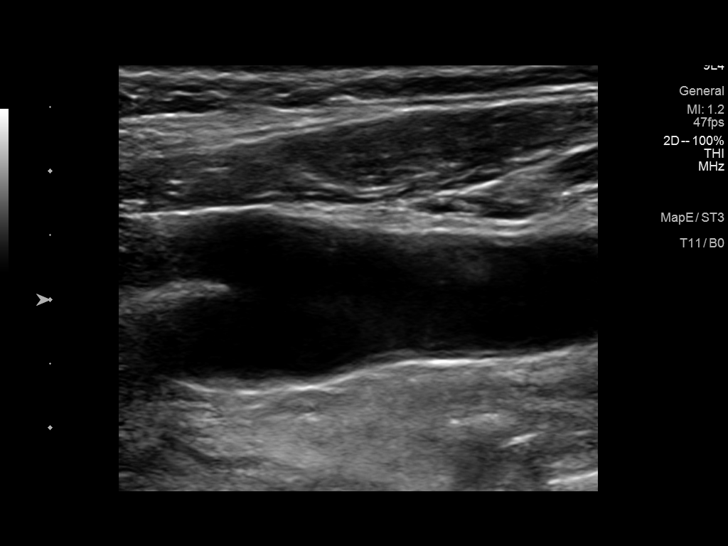
[im 25/70]
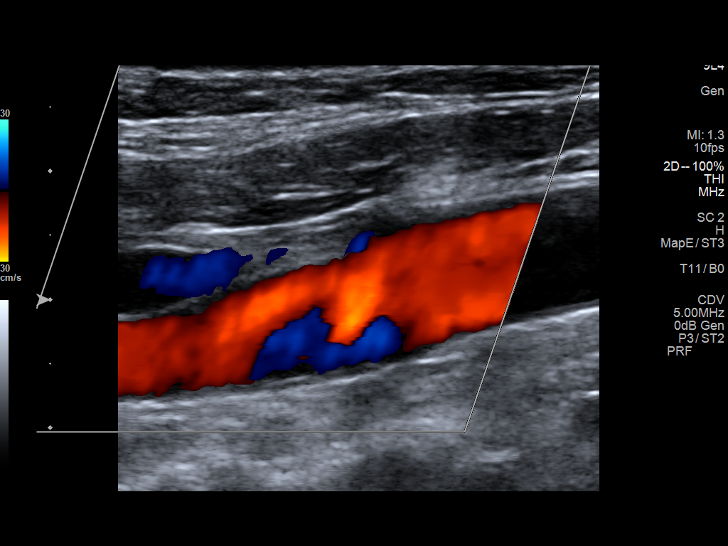
[im 31/70]
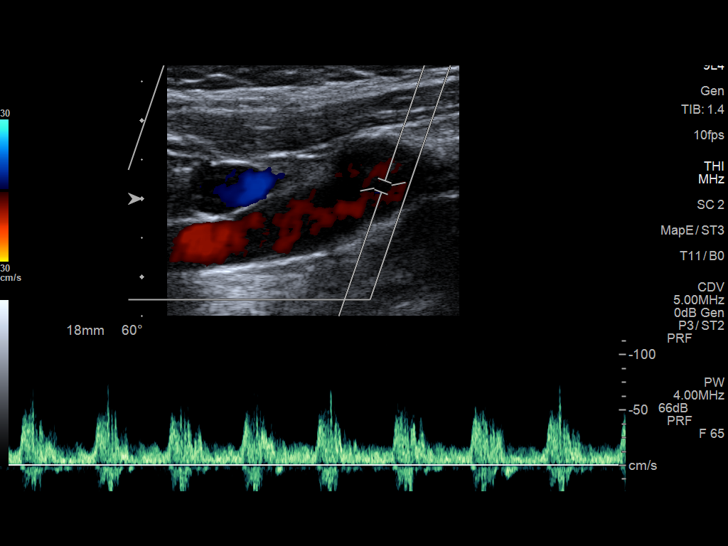
[im 37/70]
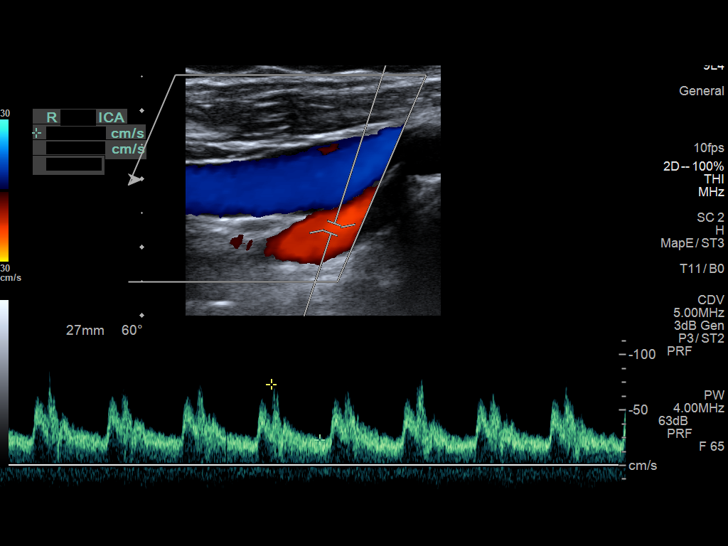
[im 40/70]
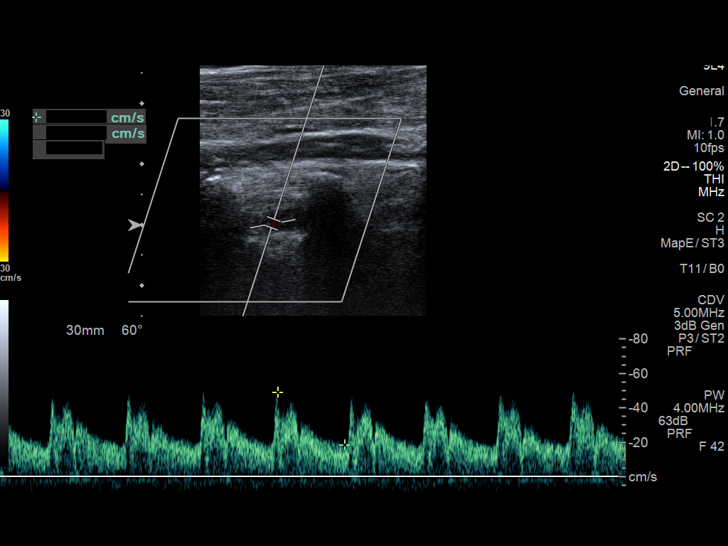
[im 46/70]
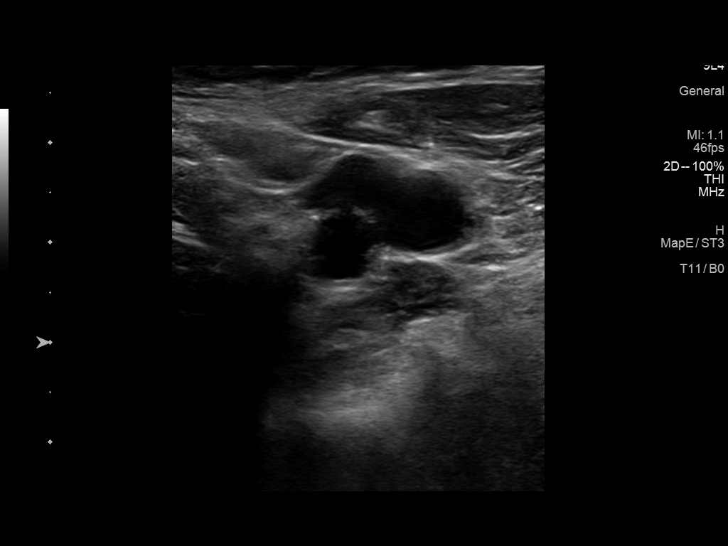
[im 52/70]
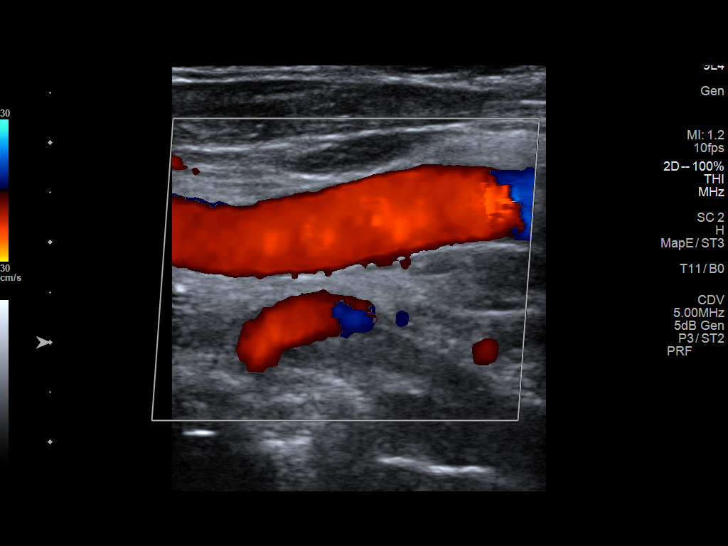
[im 58/70]
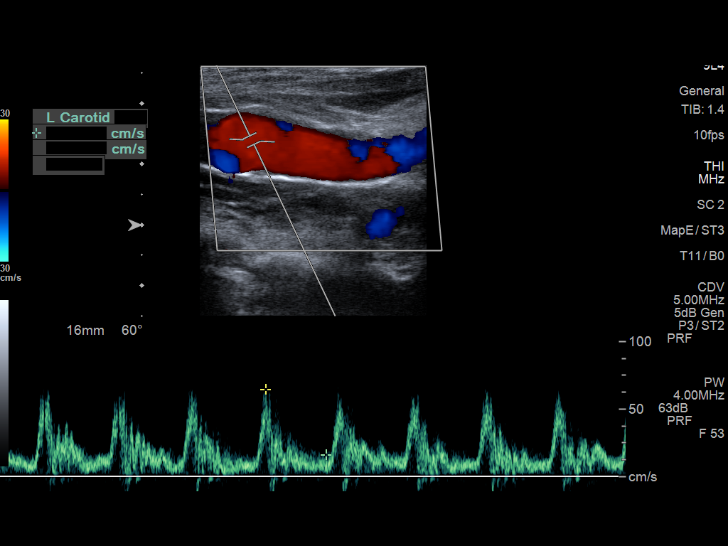
[im 64/70]
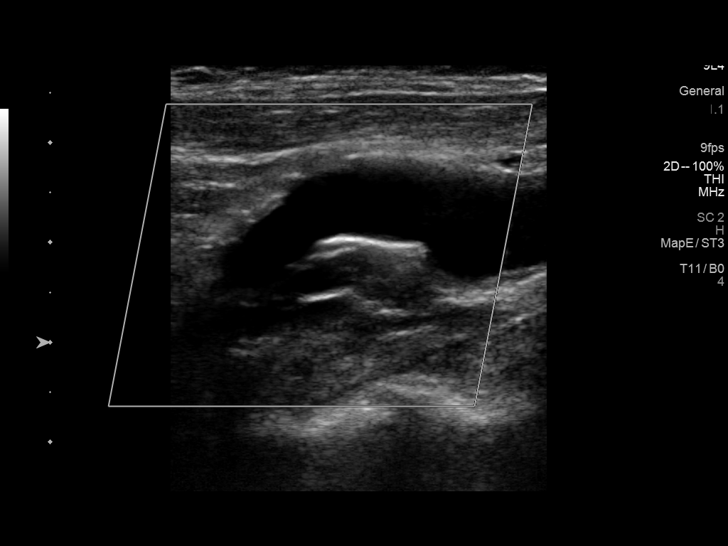
[im 70/70]
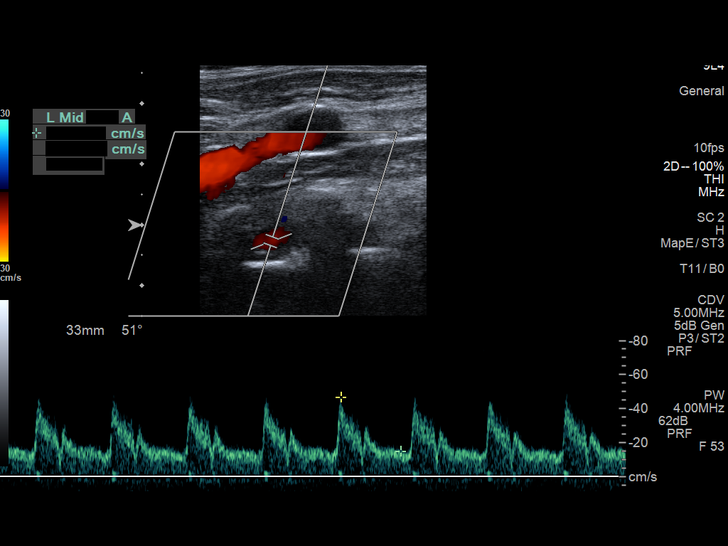

[13 of 24 positions shown; findings below may reference images not displayed]

FINDINGS: Criteria: Quantification of carotid stenosis is based on velocity
parameters that correlate the residual internal carotid diameter
with NASCET-based stenosis levels, using the diameter of the distal
internal carotid lumen as the denominator for stenosis measurement.

The following velocity measurements were obtained:

RIGHT

ICA: 86/33 cm/sec

CCA: 108/25 cm/sec

SYSTOLIC ICA/CCA RATIO:

ECA: 107 cm/sec

LEFT

ICA: 73/31 cm/sec

CCA: 136/26 cm/sec

SYSTOLIC ICA/CCA RATIO:

ECA: 70 cm/sec

RIGHT CAROTID ARTERY: There is no grayscale evidence of significant
intimal thickening or atherosclerotic plaque affecting the
interrogated portions of the right carotid system. There are no
elevated peak systolic velocities within the interrogated course of
the right internal carotid artery to suggest a hemodynamically
significant stenosis.

RIGHT VERTEBRAL ARTERY:  Antegrade Flow

LEFT CAROTID ARTERY: There is no grayscale evidence of significant
intimal thickening or atherosclerotic plaque affecting the
interrogated portions of the left carotid system. There are no
elevated peak systolic velocities within the interrogated course of
the left internal carotid artery to suggest a hemodynamically
significant stenosis.

LEFT VERTEBRAL ARTERY:  Antegrade Flow

Upper extremity blood pressures: RIGHT: 158/92 LEFT: 164/93
IMPRESSION: Unremarkable carotid Doppler ultrasound.

## 2021-03-19 ENCOUNTER — Encounter: Payer: Self-pay | Admitting: Physician Assistant

## 2021-03-19 ENCOUNTER — Ambulatory Visit (INDEPENDENT_AMBULATORY_CARE_PROVIDER_SITE_OTHER): Payer: Medicare PPO | Admitting: Physician Assistant

## 2021-03-19 ENCOUNTER — Other Ambulatory Visit: Payer: Self-pay

## 2021-03-19 DIAGNOSIS — C44321 Squamous cell carcinoma of skin of nose: Secondary | ICD-10-CM | POA: Diagnosis not present

## 2021-03-19 DIAGNOSIS — C4441 Basal cell carcinoma of skin of scalp and neck: Secondary | ICD-10-CM

## 2021-03-19 DIAGNOSIS — C4492 Squamous cell carcinoma of skin, unspecified: Secondary | ICD-10-CM

## 2021-03-19 DIAGNOSIS — D0439 Carcinoma in situ of skin of other parts of face: Secondary | ICD-10-CM | POA: Diagnosis not present

## 2021-03-19 DIAGNOSIS — C44329 Squamous cell carcinoma of skin of other parts of face: Secondary | ICD-10-CM

## 2021-03-19 DIAGNOSIS — Z85828 Personal history of other malignant neoplasm of skin: Secondary | ICD-10-CM

## 2021-03-19 DIAGNOSIS — L821 Other seborrheic keratosis: Secondary | ICD-10-CM | POA: Diagnosis not present

## 2021-03-19 DIAGNOSIS — C4491 Basal cell carcinoma of skin, unspecified: Secondary | ICD-10-CM

## 2021-03-19 DIAGNOSIS — D485 Neoplasm of uncertain behavior of skin: Secondary | ICD-10-CM

## 2021-03-19 HISTORY — DX: Squamous cell carcinoma of skin, unspecified: C44.92

## 2021-03-19 HISTORY — DX: Basal cell carcinoma of skin, unspecified: C44.91

## 2021-03-19 NOTE — Patient Instructions (Signed)

## 2021-03-19 NOTE — Progress Notes (Signed)
Follow-Up Visit   Subjective  Jeffery Underwood is a 65 y.o. male who presents for the following: Skin Problem (Patient is here because he has a few new lesions that he wants looked at. Right side neck, right arm, left cheek, left nose. X months. They can be crusty per patient. 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 ).   The following portions of the chart were reviewed this encounter and updated as appropriate:  Tobacco  Allergies  Meds  Problems  Med Hx  Surg Hx  Fam Hx       Objective  Well appearing patient in no apparent distress; mood and affect are within normal limits.  A focused examination was performed including face and ears. Relevant physical exam findings are noted in the Assessment and Plan.  Right Sideburn Thick pink crust       Right Anterior Neck Pearly papule       Left Sideburn Thick pink crust       Left Ala Nasi Red crust       Right Axilla Pink plaque with small hyperkeratotic plaque.     Assessment & Plan  Neoplasm of uncertain behavior of skin (4) Right Sideburn  Skin / nail biopsy Type of biopsy: tangential   Informed consent: discussed and consent obtained   Timeout: patient name, date of birth, surgical site, and procedure verified   Procedure prep:  Patient was prepped and draped in usual sterile fashion (Non sterile) Prep type:  Chlorhexidine Anesthesia: the lesion was anesthetized in a standard fashion   Anesthetic:  1% lidocaine w/ epinephrine 1-100,000 local infiltration Instrument used: flexible razor blade   Hemostasis achieved with: aluminum chloride   Outcome: patient tolerated procedure well   Post-procedure details: sterile dressing applied and wound care instructions given   Dressing type: bandage and petrolatum    Specimen 1 - Surgical pathology Differential Diagnosis: R/O BCC vs SCC  Check Margins: No  Right Anterior Neck  Skin / nail biopsy Type of biopsy: tangential    Informed consent: discussed and consent obtained   Timeout: patient name, date of birth, surgical site, and procedure verified   Procedure prep:  Patient was prepped and draped in usual sterile fashion (Non sterile) Prep type:  Chlorhexidine Anesthesia: the lesion was anesthetized in a standard fashion   Anesthetic:  1% lidocaine w/ epinephrine 1-100,000 local infiltration Instrument used: flexible razor blade   Hemostasis achieved with: aluminum chloride   Outcome: patient tolerated procedure well   Post-procedure details: sterile dressing applied and wound care instructions given   Dressing type: bandage and petrolatum    Specimen 2 - Surgical pathology Differential Diagnosis: R/O BCC vs SCC - two specimens   Check Margins: No  Left Sideburn  Skin / nail biopsy Type of biopsy: tangential   Informed consent: discussed and consent obtained   Timeout: patient name, date of birth, surgical site, and procedure verified   Procedure prep:  Patient was prepped and draped in usual sterile fashion (Non sterile) Prep type:  Chlorhexidine Anesthesia: the lesion was anesthetized in a standard fashion   Anesthetic:  1% lidocaine w/ epinephrine 1-100,000 local infiltration Instrument used: flexible razor blade   Hemostasis achieved with: aluminum chloride   Outcome: patient tolerated procedure well   Post-procedure details: sterile dressing applied and wound care instructions given   Dressing type: bandage and petrolatum    Specimen 3 - Surgical pathology Differential Diagnosis: R/O BCC vs SCC  Check Margins: No  Left Ala Nasi  Skin / nail biopsy Type of biopsy: tangential   Informed consent: discussed and consent obtained   Timeout: patient name, date of birth, surgical site, and procedure verified   Procedure prep:  Patient was prepped and draped in usual sterile fashion (Non sterile) Prep type:  Chlorhexidine Anesthesia: the lesion was anesthetized in a standard fashion    Anesthetic:  1% lidocaine w/ epinephrine 1-100,000 local infiltration Instrument used: flexible razor blade   Hemostasis achieved with: aluminum chloride   Outcome: patient tolerated procedure well   Post-procedure details: sterile dressing applied and wound care instructions given   Dressing type: bandage and petrolatum    Specimen 4 - Surgical pathology Differential Diagnosis: R/O BCC vs SCC  Check Margins: No  Seborrheic keratosis Right Axilla  Will remove at the patient's next appointment.    I, Shauntay Brunelli, PA-C, have reviewed all documentation's for this visit.  The documentation on 03/19/21 for the exam, diagnosis, procedures and orders are all accurate and complete.

## 2021-04-01 ENCOUNTER — Telehealth: Payer: Self-pay

## 2021-04-01 NOTE — Telephone Encounter (Signed)
Phone call to patient with his pathology results. Patient aware of results. MOH's info sent to [facility].

## 2021-04-01 NOTE — Telephone Encounter (Signed)
-----   Message from Warren Danes, [place] sent at 04/01/2021  8:10 AM EDT ----- Refer to mohs for 1,2, and 4. 30 for #3

## 2021-06-02 DIAGNOSIS — C44321 Squamous cell carcinoma of skin of nose: Secondary | ICD-10-CM | POA: Diagnosis not present

## 2021-06-02 DIAGNOSIS — L989 Disorder of the skin and subcutaneous tissue, unspecified: Secondary | ICD-10-CM | POA: Diagnosis not present

## 2021-06-04 DIAGNOSIS — G4750 Parasomnia, unspecified: Secondary | ICD-10-CM | POA: Diagnosis not present

## 2021-06-04 DIAGNOSIS — G4733 Obstructive sleep apnea (adult) (pediatric): Secondary | ICD-10-CM | POA: Diagnosis not present

## 2021-06-16 DIAGNOSIS — D0439 Carcinoma in situ of skin of other parts of face: Secondary | ICD-10-CM | POA: Diagnosis not present

## 2021-06-17 DIAGNOSIS — Z0000 Encounter for general adult medical examination without abnormal findings: Secondary | ICD-10-CM | POA: Diagnosis not present

## 2021-06-17 DIAGNOSIS — E781 Pure hyperglyceridemia: Secondary | ICD-10-CM | POA: Diagnosis not present

## 2021-06-17 DIAGNOSIS — Z125 Encounter for screening for malignant neoplasm of prostate: Secondary | ICD-10-CM | POA: Diagnosis not present

## 2021-06-17 DIAGNOSIS — Z79899 Other long term (current) drug therapy: Secondary | ICD-10-CM | POA: Diagnosis not present

## 2021-06-17 DIAGNOSIS — R7301 Impaired fasting glucose: Secondary | ICD-10-CM | POA: Diagnosis not present

## 2021-06-19 DIAGNOSIS — Z79899 Other long term (current) drug therapy: Secondary | ICD-10-CM | POA: Diagnosis not present

## 2021-06-19 DIAGNOSIS — Z0000 Encounter for general adult medical examination without abnormal findings: Secondary | ICD-10-CM | POA: Diagnosis not present

## 2021-06-19 DIAGNOSIS — J4520 Mild intermittent asthma, uncomplicated: Secondary | ICD-10-CM | POA: Diagnosis not present

## 2021-06-19 DIAGNOSIS — E781 Pure hyperglyceridemia: Secondary | ICD-10-CM | POA: Diagnosis not present

## 2021-06-19 DIAGNOSIS — R7301 Impaired fasting glucose: Secondary | ICD-10-CM | POA: Diagnosis not present

## 2021-06-19 DIAGNOSIS — I10 Essential (primary) hypertension: Secondary | ICD-10-CM | POA: Diagnosis not present

## 2021-06-19 DIAGNOSIS — G4733 Obstructive sleep apnea (adult) (pediatric): Secondary | ICD-10-CM | POA: Diagnosis not present

## 2021-06-19 DIAGNOSIS — I4891 Unspecified atrial fibrillation: Secondary | ICD-10-CM | POA: Diagnosis not present

## 2021-06-19 DIAGNOSIS — I471 Supraventricular tachycardia: Secondary | ICD-10-CM | POA: Diagnosis not present

## 2021-06-20 ENCOUNTER — Ambulatory Visit: Payer: Medicare PPO | Admitting: Cardiology

## 2021-06-20 ENCOUNTER — Other Ambulatory Visit: Payer: Self-pay

## 2021-06-20 ENCOUNTER — Encounter: Payer: Self-pay | Admitting: Cardiology

## 2021-06-20 VITALS — BP 134/80 | HR 120 | Temp 98.2°F | Resp 16 | Ht 70.0 in | Wt 205.2 lb

## 2021-06-20 DIAGNOSIS — Z9989 Dependence on other enabling machines and devices: Secondary | ICD-10-CM | POA: Diagnosis not present

## 2021-06-20 DIAGNOSIS — G4733 Obstructive sleep apnea (adult) (pediatric): Secondary | ICD-10-CM

## 2021-06-20 DIAGNOSIS — I10 Essential (primary) hypertension: Secondary | ICD-10-CM | POA: Diagnosis not present

## 2021-06-20 DIAGNOSIS — I484 Atypical atrial flutter: Secondary | ICD-10-CM

## 2021-06-20 MED ORDER — DILTIAZEM HCL ER COATED BEADS 180 MG PO CP24: 180.0000 mg | ORAL_CAPSULE | Freq: Every day | ORAL | 2 refills | Status: DC

## 2021-06-20 NOTE — Progress Notes (Signed)
Primary Physician/Referring:  Jonathon Jordan, MD  Patient ID: Jeffery Underwood, male    DOB: 05/16/1956, 65 y.o.   MRN: [id number]  Chief Complaint  Patient presents with   Atrial Fibrillation   new patiient   New Patient (Initial Visit)    Referred by Alessandra Grout, MD   HPI:    Jeffery Underwood  is a 65 y.o. Caucasian male patient with hypertension, OSA on CPAP and compliant referred to me for evaluation of atypical atrial flutter/atrial fibrillation.  Patient's symptoms started about 2 months ago with not feeling well, decreased exercise capacity, mild fatigue.  No palpitations, no chest pain, no dyspnea or leg edema.  There is no history of recent travel.   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   Right Anterior Neck   OSA (obstructive sleep apnea)    Prediabetes 09/21/2004   resolved 10/2011   Psoriasis    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   Right Sideburn (well diff)   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   Left Sideburn (in situ)   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   Left Ala Nasi (well diff)   Seasonal allergies    Past Surgical History:  Procedure Laterality Date   INGUINAL HERNIA REPAIR     right side (4-5 years ago)   Family History  Problem Relation Age of Onset   Depression Mother    Tuberculosis Mother    Osteoporosis Mother    Diabetes type II Father    Atrial fibrillation Father     Social History   Tobacco Use   Smoking status: Never   Smokeless tobacco: Never  Substance Use Topics   Alcohol use: Yes    Alcohol/week: 14.0 standard drinks    Types: 14 Cans of beer per week    Comment: 3-5 per week   Marital Status: Married  ROS  Review of Systems  Constitutional: Positive for malaise/fatigue. Negative for weight loss.  Cardiovascular:  Negative for chest  pain, dyspnea on exertion and leg swelling.  Gastrointestinal:  Negative for melena.  Objective  Blood pressure 134/80, pulse (!) 120, temperature 98.2 F (36.8 C), temperature source Temporal, resp. rate 16, height _0  (1.778 m), weight 205 lb 3.2 oz (93.1 kg), SpO2 98 %. Body mass index is 29.44 kg/m.  Vitals with BMI 06/20/2021 08/27/2020 08/27/2020  Height _1  - -  Weight 205 lbs 3 oz - -  BMI 93.26 - -  Systolic 712 458 099  Diastolic 80 90 93  Pulse 833 75 72    Physical Exam Neck:     Vascular: No carotid bruit or JVD.  Cardiovascular:     Rate and Rhythm: Tachycardia present. Rhythm irregular.     Pulses: Intact distal pulses.     Heart sounds: Normal heart sounds. No murmur heard.   No gallop.  Pulmonary:     Effort: Pulmonary effort is normal.     Breath sounds: Normal breath sounds.  Abdominal:     General: Bowel sounds are normal.     Palpations: Abdomen is soft.  Musculoskeletal:     Right lower leg: No edema.     Left lower leg: No edema.     Laboratory examination:   No results for input(s): NA, K, CL, CO2, GLUCOSE, BUN, CREATININE, CALCIUM,  GFRNONAA, GFRAA in the last 8760 hours. CrCl cannot be calculated (Patient's most recent lab result is older than the maximum 21 days allowed.).  CMP Latest Ref Rng & Units 11/29/2017 03/27/2012  Glucose 65 - 99 mg/dL 95 126(H)  BUN 6 - 20 mg/dL 12 22  Creatinine 0.61 - 1.24 mg/dL 1.07 1.34  Sodium 135 - 145 mmol/L 139 140  Potassium 3.5 - 5.1 mmol/L 3.6 3.8  Chloride 101 - 111 mmol/L 103 105  CO2 22 - 32 mmol/L 26 25  Calcium 8.9 - 10.3 mg/dL 8.9 9.2   CBC Latest Ref Rng & Units 11/29/2017 03/27/2012  WBC 4.0 - 10.5 K/uL 5.7 11.5(H)  Hemoglobin 13.0 - 17.0 g/dL 14.2 14.0  Hematocrit 39.0 - 52.0 % 39.6 39.5  Platelets 150 - 400 K/uL 230 166    Lipid Panel No results for input(s): CHOL, TRIG, LDLCALC, VLDL, HDL, CHOLHDL, LDLDIRECT in the last 8760 hours. Lipid Panel  No results found for: CHOL, TRIG, HDL,  CHOLHDL, VLDL, LDLCALC, LDLDIRECT, LABVLDL   HEMOGLOBIN A1C No results found for: HGBA1C, MPG TSH No results for input(s): TSH in the last 8760 hours.  External labs:   Labs 06/17/2021:  A1c 5.4%.  TSH normal at 1.17.  Total cholesterol 158, triglycerides 194, HDL 41, LDL 85.  Non-HDL cholesterol 118.  Hb 14.5/HCT 41.0, platelets 221.  Serum glucose 110 mg, BUN 14, creatinine 1.11, EGFR >60 mL.  Potassium 4.4.  CMP otherwise normal.  Medications and allergies  No Known Allergies   Medication prior to this encounter:   Outpatient Medications Prior to Visit  Medication Sig Dispense Refill   acetaminophen (TYLENOL) 325 MG tablet Take 650 mg by mouth every 6 (six) hours as needed.     albuterol (PROVENTIL HFA;VENTOLIN HFA) 108 (90 BASE) MCG/ACT inhaler Inhale 2 puffs into the lungs as needed for wheezing or shortness of breath.     apixaban (ELIQUIS) 5 MG TABS tablet Take 5 mg by mouth 2 (two) times daily.     azelastine (ASTELIN) 0.1 % nasal spray Place 1 spray into both nostrils 2 (two) times daily. Use in each nostril as directed     B Complex Vitamins (VITAMIN B COMPLEX) TABS Take 1 tablet by mouth daily.     budesonide-formoterol (SYMBICORT) 160-4.5 MCG/ACT inhaler Inhale 2 puffs into the lungs 2 (two) times daily.     buPROPion (WELLBUTRIN XL) 150 MG 24 hr tablet Take 150 mg by mouth daily.     cetirizine (ZYRTEC) 10 MG tablet Take 10 mg by mouth daily.     FLUoxetine (PROZAC) 10 MG capsule Take 20 mg by mouth daily.      fluticasone (FLONASE) 50 MCG/ACT nasal spray Place 1 spray into both nostrils 2 (two) times daily.  1   metoprolol tartrate (LOPRESSOR) 25 MG tablet Take 25 mg by mouth 2 (two) times daily.     montelukast (SINGULAIR) 10 MG tablet Take 10 mg by mouth at bedtime.     Multiple Vitamins-Minerals (MULTIVITAMIN PO) Take 1 tablet by mouth daily.      vitamin C (ASCORBIC ACID) 500 MG tablet Take 500 mg by mouth daily.     vitamin E 1000 UNIT capsule Take 1,000  Units by mouth daily.     amphetamine-dextroamphetamine (ADDERALL XR) 30 MG 24 hr capsule Take 45 mg by mouth every morning.      aspirin 81 MG tablet Take 81 mg by mouth daily.     benzonatate (TESSALON) 100 MG capsule Take  1 capsule (100 mg total) by mouth every 8 (eight) hours. 21 capsule 0   Ketotifen Fumarate (ZADITOR OP) Place 1 drop into both eyes 2 (two) times daily.     No facility-administered medications prior to visit.     Medication list after today's encounter   Current Outpatient Medications  Medication Instructions   acetaminophen (TYLENOL) 650 mg, Oral, Every 6 hours PRN   albuterol (PROVENTIL HFA;VENTOLIN HFA) 108 (90 BASE) MCG/ACT inhaler 2 puffs, Inhalation, As needed   apixaban (ELIQUIS) 5 mg, Oral, 2 times daily   azelastine (ASTELIN) 0.1 % nasal spray 1 spray, Each Nare, 2 times daily, Use in each nostril as directed   B Complex Vitamins (VITAMIN B COMPLEX) TABS 1 tablet, Oral, Daily   budesonide-formoterol (SYMBICORT) 160-4.5 MCG/ACT inhaler 2 puffs, Inhalation, 2 times daily   buPROPion (WELLBUTRIN XL) 150 mg, Oral, Daily   cetirizine (ZYRTEC) 10 mg, Oral, Daily   diltiazem (CARDIZEM CD) 180 mg, Oral, Daily   FLUoxetine (PROZAC) 20 mg, Oral, Daily   fluticasone (FLONASE) 50 MCG/ACT nasal spray 1 spray, Each Nare, 2 times daily   metoprolol tartrate (LOPRESSOR) 25 mg, Oral, 2 times daily   montelukast (SINGULAIR) 10 mg, Oral, Daily at bedtime   Multiple Vitamins-Minerals (MULTIVITAMIN PO) 1 tablet, Oral, Daily   vitamin C (ASCORBIC ACID) 500 mg, Oral, Daily   vitamin E (VITAMIN E) 1,000 Units, Oral, Daily    Radiology:   No results found.  Cardiac Studies:   NA  EKG:   EKG 06/20/2021: Atypical atrial flutter with rapid ventricular response at rate of 104 bpm, normal axis, incomplete right bundle branch block.  Nonspecific T abnormality.  EKG 06/05/2020: Sinus rhythm with first-degree block at rate of 65 bpm, otherwise normal EKG.  Assessment      ICD-10-CM   1. Atypical atrial flutter (HCC)  I48.4 EKG 12-Lead    PCV ECHOCARDIOGRAM COMPLETE    PCV MYOCARDIAL PERFUSION WO LEXISCAN    diltiazem (CARDIZEM CD) 180 MG 24 hr capsule    2. Primary hypertension  I10     3. OSA on CPAP  G47.33    Z99.89      CHA2DS2-VASc Score is 2.  Yearly risk of stroke: 2.3% (A, HTN).  Score of 1=0.6; 2=2.2; 3=3.2; 4=4.8; 5=7.2; 6=9.8; 7=>9.8) -(CHF; HTN; vasc disease DM,  Male = 1; Age <65 =0; 65-74 = 1,  >75 =2; stroke/embolism= 2).    Medications Discontinued During This Encounter  Medication Reason   aspirin 81 MG tablet Error   amphetamine-dextroamphetamine (ADDERALL XR) 30 MG 24 hr capsule Error   benzonatate (TESSALON) 100 MG capsule Error   Ketotifen Fumarate (ZADITOR OP) Error    Meds ordered this encounter  Medications   diltiazem (CARDIZEM CD) 180 MG 24 hr capsule    Sig: Take 1 capsule (180 mg total) by mouth daily.    Dispense:  30 capsule    Refill:  2    Orders Placed This Encounter  Procedures   PCV MYOCARDIAL PERFUSION WO LEXISCAN    Standing Status:   Future    Standing Expiration Date:   08/20/2021   EKG 12-Lead   PCV ECHOCARDIOGRAM COMPLETE    Standing Status:   Future    Standing Expiration Date:   06/20/2022   Recommendations:   Jaymir Struble is a 65 y.o. Caucasian male patient with hypertension, OSA on CPAP and compliant referred to me for evaluation of atypical atrial flutter/atrial fibrillation.  Patient's symptoms started about 2 months  ago with not feeling well, decreased exercise capacity, mild fatigue.  No palpitations, no chest pain, no dyspnea or leg edema.  There is no history of recent travel.  His physical examination except for irregular rhythm on cardiac exam, it is essentially normal.  He is already on appropriate medical therapy with regard to hypertension and blood pressure is well controlled, lipids are normal.  He needs ischemic work-up to exclude coronary artery disease in view of his thyroid  function test and other labs being normal.  We will schedule him for exercise nuclear stress test along with an echocardiogram. He is also compliant with CPAP.  He was started yesterday on Eliquis, continue same.  In view of elevated heart rate, I will also add diltiazem along with metoprolol that he is already on for rate control.  If nuclear stress test is nonischemic and echocardiogram is within normal limits, I will set him up for direct-current cardioversion.  I would like to see him back in the office in 3 to 4 weeks for follow-up.  His wife is present at the bedside and all questions answered.    Adrian Prows, MD, [facility] 06/20/2021, 10:52 PM Office: [phone]

## 2021-06-24 ENCOUNTER — Ambulatory Visit: Payer: Medicare PPO | Admitting: Physician Assistant

## 2021-07-07 ENCOUNTER — Ambulatory Visit: Payer: Medicare PPO | Admitting: Cardiology

## 2021-07-07 ENCOUNTER — Ambulatory Visit: Payer: Medicare PPO

## 2021-07-07 ENCOUNTER — Other Ambulatory Visit: Payer: Self-pay

## 2021-07-07 VITALS — BP 113/88 | HR 132 | Temp 98.4°F | Resp 17 | Ht 70.0 in | Wt 204.0 lb

## 2021-07-07 DIAGNOSIS — I484 Atypical atrial flutter: Secondary | ICD-10-CM

## 2021-07-07 DIAGNOSIS — I4819 Other persistent atrial fibrillation: Secondary | ICD-10-CM | POA: Insufficient documentation

## 2021-07-07 DIAGNOSIS — I10 Essential (primary) hypertension: Secondary | ICD-10-CM | POA: Diagnosis not present

## 2021-07-07 NOTE — Progress Notes (Signed)
65 year old Caucasian male with hypertension,OSA on CPAP, atypical atrial flutter/fibrillan.  Patient was in the office today for stress test and echocardiogram.  During echocardiogram, He was noted to be tachycardic upwards of 130s.  EKG 07/07/2021: Atrial fibrillation 114 bpm Nonspecific ST-T changes  Vitals:   07/07/21 1406  BP: 113/88  Pulse: (!) 132  Resp: 17  Temp: 98.4 F (36.9 C)  SpO2: 96%   Current Outpatient Medications on File Prior to Visit  Medication Sig Dispense Refill   acetaminophen (TYLENOL) 325 MG tablet Take 650 mg by mouth every 6 (six) hours as needed.     albuterol (PROVENTIL HFA;VENTOLIN HFA) 108 (90 BASE) MCG/ACT inhaler Inhale 2 puffs into the lungs as needed for wheezing or shortness of breath.     apixaban (ELIQUIS) 5 MG TABS tablet Take 5 mg by mouth 2 (two) times daily.     azelastine (ASTELIN) 0.1 % nasal spray Place 1 spray into both nostrils 2 (two) times daily. Use in each nostril as directed     B Complex Vitamins (VITAMIN B COMPLEX) TABS Take 1 tablet by mouth daily.     budesonide-formoterol (SYMBICORT) 160-4.5 MCG/ACT inhaler Inhale 2 puffs into the lungs 2 (two) times daily.     buPROPion (WELLBUTRIN XL) 150 MG 24 hr tablet Take 150 mg by mouth daily.     cetirizine (ZYRTEC) 10 MG tablet Take 10 mg by mouth daily.     diltiazem (CARDIZEM CD) 180 MG 24 hr capsule Take 1 capsule (180 mg total) by mouth daily. 30 capsule 2   FLUoxetine (PROZAC) 10 MG capsule Take 20 mg by mouth daily.      fluticasone (FLONASE) 50 MCG/ACT nasal spray Place 1 spray into both nostrils 2 (two) times daily.  1   lisinopril (ZESTRIL) 5 MG tablet Take 5 mg by mouth daily.     metoprolol tartrate (LOPRESSOR) 25 MG tablet Take 25 mg by mouth daily.     montelukast (SINGULAIR) 10 MG tablet Take 10 mg by mouth at bedtime.     Multiple Vitamins-Minerals (MULTIVITAMIN PO) Take 1 tablet by mouth daily.      vitamin C (ASCORBIC ACID) 500 MG tablet Take 500 mg by mouth daily.      vitamin E 1000 UNIT capsule Take 1,000 Units by mouth daily.     No current facility-administered medications on file prior to visit.   Recommend taking metoprolol tartrate 25 mg twice daily, instead of once daily. Gave samples for eliquis 5 mg bid.  Reschedule echocardiogram, followed by office visit- currently scheduled w/Celeste Ochelata, PA 07/18/2021   Nigel Mormon, MD Pager: [phone] Office: [phone]

## 2021-07-08 LAB — PCV MYOCARDIAL PERFUSION WO LEXISCAN
Angina Index: 0
Base ST Depression (mm): 0 mm
ST Depression (mm): 0 mm

## 2021-07-09 ENCOUNTER — Other Ambulatory Visit: Payer: Self-pay

## 2021-07-09 ENCOUNTER — Ambulatory Visit: Payer: Medicare PPO

## 2021-07-09 DIAGNOSIS — I484 Atypical atrial flutter: Secondary | ICD-10-CM | POA: Diagnosis not present

## 2021-07-17 NOTE — Progress Notes (Signed)
Primary Physician/Referring:  Jonathon Jordan, MD  Patient ID: Nestor Wieneke, male    DOB: 09-20-1956, 65 y.o.   MRN: [id number]  Chief Complaint  Patient presents with   Atrial Flutter   Follow-up   HPI:    Brandin Stetzer  is a 65 y.o. Caucasian male patient with hypertension, OSA on CPAP and compliant.  Patient was referred to our office for evaluation of atypical atrial flutter/atrial fibrillation.    He now presents for 4-week follow-up.  Last office visit scheduled for echocardiogram and nuclear stress test as well as added diltiazem and metoprolol.  Patient has been started on Eliquis 06/19/2021.  Patient subsequently presented for echocardiogram 07/06/2021 with heart rate >130 bpm.  Patient had only been taking metoprolol titrate once daily, he was advised to take it twice daily.  Echocardiogram noted normal LVEF with left atrial cavity moderately dilated and mild MR.  Stress test revealed normal myocardial perfusion and was in the low risk.   Patient continues to feel fatigued with decreased exercise tolerance.  Denies chest pain, palpitations, dyspnea, syncope, near syncope.  Denies orthopnea, PND, leg edema.  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   Right Anterior Neck   OSA (obstructive sleep apnea)    Prediabetes 09/21/2004   resolved 10/2011   Psoriasis    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   Right Sideburn (well diff)   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   Left Sideburn (in situ)   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   Left [place] (well diff)   Seasonal allergies    Past Surgical History:  Procedure Laterality Date   INGUINAL HERNIA REPAIR     right side (4-5 years ago)   Family History  Problem Relation Age of Onset   Depression Mother    Tuberculosis Mother    Osteoporosis  Mother    Diabetes type II Father    Atrial fibrillation Father     Social History   Tobacco Use   Smoking status: Never   Smokeless tobacco: Never  Substance Use Topics   Alcohol use: Yes    Alcohol/week: 14.0 standard drinks    Types: 14 Cans of beer per week    Comment: 3-5 per week   Marital Status: Married  ROS  Review of Systems  Constitutional: Positive for malaise/fatigue. Negative for weight gain and weight loss.  Cardiovascular:  Negative for chest pain, claudication, dyspnea on exertion, leg swelling, near-syncope, orthopnea, palpitations, paroxysmal nocturnal dyspnea and syncope.  Respiratory:  Negative for shortness of breath.   Gastrointestinal:  Negative for melena.  Neurological:  Negative for dizziness.  Objective  Blood pressure 124/79, pulse 81, temperature 97.8 F (36.6 C), temperature source Temporal, height 5' 10" (1.778 m), weight 204 lb (92.5 kg), SpO2 98 %. Body mass index is 29.27 kg/m.  Vitals with BMI 07/18/2021 07/07/2021 06/20/2021  Height 5' 10" 5' 10" 5' 10"  Weight 204 lbs 204 lbs 205 lbs 3 oz  BMI 29.27 82.50 53.97  Systolic 673 419 379  Diastolic 79 88 80  Pulse 81 132 120    Physical Exam Vitals reviewed.  Neck:     Vascular: No carotid bruit or JVD.  Cardiovascular:     Rate and Rhythm: Normal rate. Rhythm irregular.     Pulses: Intact  distal pulses.     Heart sounds: Normal heart sounds, S1 normal and S2 normal. No murmur heard.   No gallop.  Pulmonary:     Effort: Pulmonary effort is normal. No respiratory distress.     Breath sounds: Normal breath sounds. No wheezing, rhonchi or rales.  Musculoskeletal:     Right lower leg: No edema.     Left lower leg: No edema.  Neurological:     Mental Status: He is alert.     Laboratory examination:   No results for input(s): NA, K, CL, CO2, GLUCOSE, BUN, CREATININE, CALCIUM, GFRNONAA, GFRAA in the last 8760 hours. CrCl cannot be calculated (Patient's most recent lab result is older  than the maximum 21 days allowed.).  CMP Latest Ref Rng & Units 11/29/2017 03/27/2012  Glucose 65 - 99 mg/dL 95 126(H)  BUN 6 - 20 mg/dL 12 22  Creatinine 0.61 - 1.24 mg/dL 1.07 1.34  Sodium 135 - 145 mmol/L 139 140  Potassium 3.5 - 5.1 mmol/L 3.6 3.8  Chloride 101 - 111 mmol/L 103 105  CO2 22 - 32 mmol/L 26 25  Calcium 8.9 - 10.3 mg/dL 8.9 9.2   CBC Latest Ref Rng & Units 11/29/2017 03/27/2012  WBC 4.0 - 10.5 K/uL 5.7 11.5(H)  Hemoglobin 13.0 - 17.0 g/dL 14.2 14.0  Hematocrit 39.0 - 52.0 % 39.6 39.5  Platelets 150 - 400 K/uL 230 166    Lipid Panel No results for input(s): CHOL, TRIG, LDLCALC, VLDL, HDL, CHOLHDL, LDLDIRECT in the last 8760 hours. Lipid Panel  No results found for: CHOL, TRIG, HDL, CHOLHDL, VLDL, LDLCALC, LDLDIRECT, LABVLDL   HEMOGLOBIN A1C No results found for: HGBA1C, MPG TSH No results for input(s): TSH in the last 8760 hours.  External labs:   06/17/2021: A1c 5.4%.  TSH normal at 1.17.  Total cholesterol 158, triglycerides 194, HDL 41, LDL 85.  Non-HDL cholesterol 118.  Hb 14.5/HCT 41.0, platelets 221.  Serum glucose 110 mg, BUN 14, creatinine 1.11, EGFR >60 mL.  Potassium 4.4.  CMP otherwise normal.  Medications and allergies  No Known Allergies   Medication prior to this encounter:   Outpatient Medications Prior to Visit  Medication Sig Dispense Refill   acetaminophen (TYLENOL) 325 MG tablet Take 650 mg by mouth every 6 (six) hours as needed.     albuterol (PROVENTIL HFA;VENTOLIN HFA) 108 (90 BASE) MCG/ACT inhaler Inhale 2 puffs into the lungs as needed for wheezing or shortness of breath.     azelastine (ASTELIN) 0.1 % nasal spray Place 1 spray into both nostrils 2 (two) times daily. Use in each nostril as directed     B Complex Vitamins (VITAMIN B COMPLEX) TABS Take 1 tablet by mouth daily.     budesonide-formoterol (SYMBICORT) 160-4.5 MCG/ACT inhaler Inhale 2 puffs into the lungs 2 (two) times daily.     buPROPion (WELLBUTRIN XL) 150 MG 24 hr  tablet Take 150 mg by mouth daily.     cetirizine (ZYRTEC) 10 MG tablet Take 10 mg by mouth daily.     FLUoxetine (PROZAC) 10 MG capsule Take 20 mg by mouth daily.      fluticasone (FLONASE) 50 MCG/ACT nasal spray Place 1 spray into both nostrils 2 (two) times daily.  1   lisinopril (ZESTRIL) 5 MG tablet Take 5 mg by mouth daily.     montelukast (SINGULAIR) 10 MG tablet Take 10 mg by mouth at bedtime.     Multiple Vitamins-Minerals (MULTIVITAMIN PO) Take 1 tablet by mouth daily.  vitamin C (ASCORBIC ACID) 500 MG tablet Take 500 mg by mouth daily.     vitamin E 1000 UNIT capsule Take 1,000 Units by mouth daily.     apixaban (ELIQUIS) 5 MG TABS tablet Take 5 mg by mouth 2 (two) times daily.     diltiazem (CARDIZEM CD) 180 MG 24 hr capsule Take 1 capsule (180 mg total) by mouth daily. 30 capsule 2   metoprolol tartrate (LOPRESSOR) 25 MG tablet Take 25 mg by mouth 2 (two) times daily.     No facility-administered medications prior to visit.     Medication list after today's encounter   Current Outpatient Medications  Medication Instructions   acetaminophen (TYLENOL) 650 mg, Oral, Every 6 hours PRN   albuterol (PROVENTIL HFA;VENTOLIN HFA) 108 (90 BASE) MCG/ACT inhaler 2 puffs, Inhalation, As needed   apixaban (ELIQUIS) 5 mg, Oral, 2 times daily   apixaban (ELIQUIS) 5 mg, Oral, 2 times daily   azelastine (ASTELIN) 0.1 % nasal spray 1 spray, Each Nare, 2 times daily, Use in each nostril as directed   B Complex Vitamins (VITAMIN B COMPLEX) TABS 1 tablet, Oral, Daily   budesonide-formoterol (SYMBICORT) 160-4.5 MCG/ACT inhaler 2 puffs, Inhalation, 2 times daily   buPROPion (WELLBUTRIN XL) 150 mg, Oral, Daily   cetirizine (ZYRTEC) 10 mg, Oral, Daily   diltiazem (CARDIZEM CD) 180 mg, Oral, Daily   FLUoxetine (PROZAC) 20 mg, Oral, Daily   fluticasone (FLONASE) 50 MCG/ACT nasal spray 1 spray, Each Nare, 2 times daily   lisinopril (ZESTRIL) 5 mg, Oral, Daily   metoprolol tartrate (LOPRESSOR) 25  mg, Oral, 2 times daily   montelukast (SINGULAIR) 10 mg, Oral, Daily at bedtime   Multiple Vitamins-Minerals (MULTIVITAMIN PO) 1 tablet, Oral, Daily   vitamin C (ASCORBIC ACID) 500 mg, Oral, Daily   vitamin E 1,000 Units, Oral, Daily    Radiology:   No results found.  Cardiac Studies:   PCV ECHOCARDIOGRAM COMPLETE 07/09/2021 Left ventricle cavity is normal in size. Mild concentric hypertrophy of the left ventricle. Normal global wall motion. Normal LV systolic function with visual EF 50-55%. Indeterminate diastolic filling pattern. Left atrial cavity is moderately dilated. Mild (Grade I) mitral regurgitation. Estimated right atrial pressure 8 mmHg.   PCV MYOCARDIAL PERFUSION WO LEXISCAN 07/07/2021 Exercise nuclear stress test was performed using Bruce protocol. Patient reached 6 METS, and 97% of age predicted maximum heart rate. Exercise capacity was fair. No chest pain reported, dyspnea reported. Heart rate and hemodynamic response were normal. Rest and stress EKG showed Afib w/RVR, no significant ST-T changes. Normal myocardial perfusion. Stress LVEF 66%.  EKG:  07/18/21: Atypical atrial flutter with controlled ventricular response at a rate of 71 bpm.  EKG 06/20/2021: Atypical atrial flutter with rapid ventricular response at rate of 104 bpm, normal axis, incomplete right bundle branch block.  Nonspecific T abnormality.  EKG 06/05/2020: Sinus rhythm with first-degree block at rate of 65 bpm, otherwise normal EKG.  Assessment     ICD-10-CM   1. Atypical atrial flutter (HCC)  I48.4 EKG [id number]    Basic metabolic panel    CBC    diltiazem (CARDIZEM CD) 180 MG 24 hr capsule    2. Persistent atrial fibrillation (HCC)  I48.19      CHA2DS2-VASc Score is 2.  Yearly risk of stroke: 2.3% (A, HTN).  Score of 1=0.6; 2=2.2; 3=3.2; 4=4.8; 5=7.2; 6=9.8; 7=>9.8) -(CHF; HTN; vasc disease DM,  Male = 1; Age <65 =0; 65-74 = 1,  >75 =2; stroke/embolism= 2).  Medications Discontinued During  This Encounter  Medication Reason   apixaban (ELIQUIS) 5 MG TABS tablet Reorder   metoprolol tartrate (LOPRESSOR) 25 MG tablet Reorder   diltiazem (CARDIZEM CD) 180 MG 24 hr capsule Reorder    Meds ordered this encounter  Medications   apixaban (ELIQUIS) 5 MG TABS tablet    Sig: Take 1 tablet (5 mg total) by mouth 2 (two) times daily.    Dispense:  60 tablet    Refill:  0   apixaban (ELIQUIS) 5 MG TABS tablet    Sig: Take 1 tablet (5 mg total) by mouth 2 (two) times daily.    Dispense:  180 tablet    Refill:  3   metoprolol tartrate (LOPRESSOR) 25 MG tablet    Sig: Take 1 tablet (25 mg total) by mouth 2 (two) times daily.    Dispense:  60 tablet    Refill:  3   diltiazem (CARDIZEM CD) 180 MG 24 hr capsule    Sig: Take 1 capsule (180 mg total) by mouth daily.    Dispense:  30 capsule    Refill:  3    Orders Placed This Encounter  Procedures   Basic metabolic panel   CBC   EKG 12-Lead   Recommendations:   Simcha Speir is a 65 y.o. Caucasian male patient with hypertension, OSA on CPAP and compliant.  Patient was referred to our office for evaluation of atypical atrial flutter/atrial fibrillation.    He now presents for 4-week follow-up.  Last office visit scheduled for echocardiogram and nuclear stress test as well as added diltiazem and metoprolol.  Patient has been started on Eliquis 06/19/2021.  Patient subsequently presented for echocardiogram 07/06/2021 with heart rate >130 bpm.  Patient had only been taking metoprolol titrate once daily, he was advised to take it twice daily.  Echocardiogram noted normal LVEF with left atrial cavity moderately dilated and mild MR.  Stress test revealed normal myocardial perfusion and was in the low risk.  EKG today demonstrates patient remains in atrial flutter, however he is now well rate controlled.  Given that stress test is nonischemic and echocardiogram is essentially within normal limits recommend direct-current cardioversion.  Patient is  accompanied by his wife at today's visit.  Discussed alternatives, risks, benefits of direct-current cardioversion.  Patient and his wife both verbalized understanding and all of their questions were addressed.  Patient wishes to proceed with cardioversion.  He is on anticoagulation, and understands the importance of medication compliance.  Set him up for cardioversion and see him back in follow-up afterwards.  Notably he remains compliant with CPAP, encouraged him to continue to do so.  I have refilled his medications today.  Follow-up in approximately 6 weeks after cardioversion.   Alethia Berthold, PA-C 07/18/2021, 1:28 PM Office: [phone]

## 2021-07-17 NOTE — H&P (View-Only) (Signed)
 Primary Physician/Referring:  Wolters, Sharon, MD  Patient ID: Jeffery Underwood, male    DOB: 06/12/1956, 65 y.o.   MRN: [id number]  Chief Complaint  Patient presents with   Atrial Flutter   Follow-up   HPI:    Jeffery Underwood  is a 65 y.o. Caucasian male patient with hypertension, OSA on CPAP and compliant.  Patient was referred to our office for evaluation of atypical atrial flutter/atrial fibrillation.    He now presents for 4-week follow-up.  Last office visit scheduled for echocardiogram and nuclear stress test as well as added diltiazem and metoprolol.  Patient has been started on Eliquis 06/19/2021.  Patient subsequently presented for echocardiogram 07/06/2021 with heart rate >130 bpm.  Patient had only been taking metoprolol titrate once daily, he was advised to take it twice daily.  Echocardiogram noted normal LVEF with left atrial cavity moderately dilated and mild MR.  Stress test revealed normal myocardial perfusion and was in the low risk.   Patient continues to feel fatigued with decreased exercise tolerance.  Denies chest pain, palpitations, dyspnea, syncope, near syncope.  Denies orthopnea, PND, leg edema.  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   Right Anterior Neck   OSA (obstructive sleep apnea)    Prediabetes 09/21/2004   resolved 10/2011   Psoriasis    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   Right Sideburn (well diff)   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   Left Sideburn (in situ)   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   Left [place] (well diff)   Seasonal allergies    Past Surgical History:  Procedure Laterality Date   INGUINAL HERNIA REPAIR     right side (4-5 years ago)   Family History  Problem Relation Age of Onset   Depression Mother    Tuberculosis Mother    Osteoporosis  Mother    Diabetes type II Father    Atrial fibrillation Father     Social History   Tobacco Use   Smoking status: Never   Smokeless tobacco: Never  Substance Use Topics   Alcohol use: Yes    Alcohol/week: 14.0 standard drinks    Types: 14 Cans of beer per week    Comment: 3-5 per week   Marital Status: Married  ROS  Review of Systems  Constitutional: Positive for malaise/fatigue. Negative for weight gain and weight loss.  Cardiovascular:  Negative for chest pain, claudication, dyspnea on exertion, leg swelling, near-syncope, orthopnea, palpitations, paroxysmal nocturnal dyspnea and syncope.  Respiratory:  Negative for shortness of breath.   Gastrointestinal:  Negative for melena.  Neurological:  Negative for dizziness.  Objective  Blood pressure 124/79, pulse 81, temperature 97.8 F (36.6 C), temperature source Temporal, height 5' 10" (1.778 m), weight 204 lb (92.5 kg), SpO2 98 %. Body mass index is 29.27 kg/m.  Vitals with BMI 07/18/2021 07/07/2021 06/20/2021  Height 5' 10" 5' 10" 5' 10"  Weight 204 lbs 204 lbs 205 lbs 3 oz  BMI 29.27 29.27 29.44  Systolic 124 113 134  Diastolic 79 88 80  Pulse 81 132 120    Physical Exam Vitals reviewed.  Neck:     Vascular: No carotid bruit or JVD.  Cardiovascular:     Rate and Rhythm: Normal rate. Rhythm irregular.     Pulses: Intact   distal pulses.     Heart sounds: Normal heart sounds, S1 normal and S2 normal. No murmur heard.   No gallop.  Pulmonary:     Effort: Pulmonary effort is normal. No respiratory distress.     Breath sounds: Normal breath sounds. No wheezing, rhonchi or rales.  Musculoskeletal:     Right lower leg: No edema.     Left lower leg: No edema.  Neurological:     Mental Status: He is alert.     Laboratory examination:   No results for input(s): NA, K, CL, CO2, GLUCOSE, BUN, CREATININE, CALCIUM, GFRNONAA, GFRAA in the last 8760 hours. CrCl cannot be calculated (Patient's most recent lab result is older  than the maximum 21 days allowed.).  CMP Latest Ref Rng & Units 11/29/2017 03/27/2012  Glucose 65 - 99 mg/dL 95 126(H)  BUN 6 - 20 mg/dL 12 22  Creatinine 0.61 - 1.24 mg/dL 1.07 1.34  Sodium 135 - 145 mmol/L 139 140  Potassium 3.5 - 5.1 mmol/L 3.6 3.8  Chloride 101 - 111 mmol/L 103 105  CO2 22 - 32 mmol/L 26 25  Calcium 8.9 - 10.3 mg/dL 8.9 9.2   CBC Latest Ref Rng & Units 11/29/2017 03/27/2012  WBC 4.0 - 10.5 K/uL 5.7 11.5(H)  Hemoglobin 13.0 - 17.0 g/dL 14.2 14.0  Hematocrit 39.0 - 52.0 % 39.6 39.5  Platelets 150 - 400 K/uL 230 166    Lipid Panel No results for input(s): CHOL, TRIG, LDLCALC, VLDL, HDL, CHOLHDL, LDLDIRECT in the last 8760 hours. Lipid Panel  No results found for: CHOL, TRIG, HDL, CHOLHDL, VLDL, LDLCALC, LDLDIRECT, LABVLDL   HEMOGLOBIN A1C No results found for: HGBA1C, MPG TSH No results for input(s): TSH in the last 8760 hours.  External labs:   06/17/2021: A1c 5.4%.  TSH normal at 1.17.  Total cholesterol 158, triglycerides 194, HDL 41, LDL 85.  Non-HDL cholesterol 118.  Hb 14.5/HCT 41.0, platelets 221.  Serum glucose 110 mg, BUN 14, creatinine 1.11, EGFR >60 mL.  Potassium 4.4.  CMP otherwise normal.  Medications and allergies  No Known Allergies   Medication prior to this encounter:   Outpatient Medications Prior to Visit  Medication Sig Dispense Refill   acetaminophen (TYLENOL) 325 MG tablet Take 650 mg by mouth every 6 (six) hours as needed.     albuterol (PROVENTIL HFA;VENTOLIN HFA) 108 (90 BASE) MCG/ACT inhaler Inhale 2 puffs into the lungs as needed for wheezing or shortness of breath.     azelastine (ASTELIN) 0.1 % nasal spray Place 1 spray into both nostrils 2 (two) times daily. Use in each nostril as directed     B Complex Vitamins (VITAMIN B COMPLEX) TABS Take 1 tablet by mouth daily.     budesonide-formoterol (SYMBICORT) 160-4.5 MCG/ACT inhaler Inhale 2 puffs into the lungs 2 (two) times daily.     buPROPion (WELLBUTRIN XL) 150 MG 24 hr  tablet Take 150 mg by mouth daily.     cetirizine (ZYRTEC) 10 MG tablet Take 10 mg by mouth daily.     FLUoxetine (PROZAC) 10 MG capsule Take 20 mg by mouth daily.      fluticasone (FLONASE) 50 MCG/ACT nasal spray Place 1 spray into both nostrils 2 (two) times daily.  1   lisinopril (ZESTRIL) 5 MG tablet Take 5 mg by mouth daily.     montelukast (SINGULAIR) 10 MG tablet Take 10 mg by mouth at bedtime.     Multiple Vitamins-Minerals (MULTIVITAMIN PO) Take 1 tablet by mouth daily.        vitamin C (ASCORBIC ACID) 500 MG tablet Take 500 mg by mouth daily.     vitamin E 1000 UNIT capsule Take 1,000 Units by mouth daily.     apixaban (ELIQUIS) 5 MG TABS tablet Take 5 mg by mouth 2 (two) times daily.     diltiazem (CARDIZEM CD) 180 MG 24 hr capsule Take 1 capsule (180 mg total) by mouth daily. 30 capsule 2   metoprolol tartrate (LOPRESSOR) 25 MG tablet Take 25 mg by mouth 2 (two) times daily.     No facility-administered medications prior to visit.     Medication list after today's encounter   Current Outpatient Medications  Medication Instructions   acetaminophen (TYLENOL) 650 mg, Oral, Every 6 hours PRN   albuterol (PROVENTIL HFA;VENTOLIN HFA) 108 (90 BASE) MCG/ACT inhaler 2 puffs, Inhalation, As needed   apixaban (ELIQUIS) 5 mg, Oral, 2 times daily   apixaban (ELIQUIS) 5 mg, Oral, 2 times daily   azelastine (ASTELIN) 0.1 % nasal spray 1 spray, Each Nare, 2 times daily, Use in each nostril as directed   B Complex Vitamins (VITAMIN B COMPLEX) TABS 1 tablet, Oral, Daily   budesonide-formoterol (SYMBICORT) 160-4.5 MCG/ACT inhaler 2 puffs, Inhalation, 2 times daily   buPROPion (WELLBUTRIN XL) 150 mg, Oral, Daily   cetirizine (ZYRTEC) 10 mg, Oral, Daily   diltiazem (CARDIZEM CD) 180 mg, Oral, Daily   FLUoxetine (PROZAC) 20 mg, Oral, Daily   fluticasone (FLONASE) 50 MCG/ACT nasal spray 1 spray, Each Nare, 2 times daily   lisinopril (ZESTRIL) 5 mg, Oral, Daily   metoprolol tartrate (LOPRESSOR) 25  mg, Oral, 2 times daily   montelukast (SINGULAIR) 10 mg, Oral, Daily at bedtime   Multiple Vitamins-Minerals (MULTIVITAMIN PO) 1 tablet, Oral, Daily   vitamin C (ASCORBIC ACID) 500 mg, Oral, Daily   vitamin E 1,000 Units, Oral, Daily    Radiology:   No results found.  Cardiac Studies:   PCV ECHOCARDIOGRAM COMPLETE 07/09/2021 Left ventricle cavity is normal in size. Mild concentric hypertrophy of the left ventricle. Normal global wall motion. Normal LV systolic function with visual EF 50-55%. Indeterminate diastolic filling pattern. Left atrial cavity is moderately dilated. Mild (Grade I) mitral regurgitation. Estimated right atrial pressure 8 mmHg.   PCV MYOCARDIAL PERFUSION WO LEXISCAN 07/07/2021 Exercise nuclear stress test was performed using Bruce protocol. Patient reached 6 METS, and 97% of age predicted maximum heart rate. Exercise capacity was fair. No chest pain reported, dyspnea reported. Heart rate and hemodynamic response were normal. Rest and stress EKG showed Afib w/RVR, no significant ST-T changes. Normal myocardial perfusion. Stress LVEF 66%.  EKG:  07/18/21: Atypical atrial flutter with controlled ventricular response at a rate of 71 bpm.  EKG 06/20/2021: Atypical atrial flutter with rapid ventricular response at rate of 104 bpm, normal axis, incomplete right bundle branch block.  Nonspecific T abnormality.  EKG 06/05/2020: Sinus rhythm with first-degree block at rate of 65 bpm, otherwise normal EKG.  Assessment     ICD-10-CM   1. Atypical atrial flutter (HCC)  I48.4 EKG 27-CWCB    Basic metabolic panel    CBC    diltiazem (CARDIZEM CD) 180 MG 24 hr capsule    2. Persistent atrial fibrillation (HCC)  I48.19      CHA2DS2-VASc Score is 2.  Yearly risk of stroke: 2.3% (A, HTN).  Score of 1=0.6; 2=2.2; 3=3.2; 4=4.8; 5=7.2; 6=9.8; 7=>9.8) -(CHF; HTN; vasc disease DM,  Male = 1; Age <65 =0; 65-74 = 1,  >75 =2; stroke/embolism= 2).  Medications Discontinued During  This Encounter  Medication Reason   apixaban (ELIQUIS) 5 MG TABS tablet Reorder   metoprolol tartrate (LOPRESSOR) 25 MG tablet Reorder   diltiazem (CARDIZEM CD) 180 MG 24 hr capsule Reorder    Meds ordered this encounter  Medications   apixaban (ELIQUIS) 5 MG TABS tablet    Sig: Take 1 tablet (5 mg total) by mouth 2 (two) times daily.    Dispense:  60 tablet    Refill:  0   apixaban (ELIQUIS) 5 MG TABS tablet    Sig: Take 1 tablet (5 mg total) by mouth 2 (two) times daily.    Dispense:  180 tablet    Refill:  3   metoprolol tartrate (LOPRESSOR) 25 MG tablet    Sig: Take 1 tablet (25 mg total) by mouth 2 (two) times daily.    Dispense:  60 tablet    Refill:  3   diltiazem (CARDIZEM CD) 180 MG 24 hr capsule    Sig: Take 1 capsule (180 mg total) by mouth daily.    Dispense:  30 capsule    Refill:  3    Orders Placed This Encounter  Procedures   Basic metabolic panel   CBC   EKG 12-Lead   Recommendations:   Manley Mcnew is a 65 y.o. Caucasian male patient with hypertension, OSA on CPAP and compliant.  Patient was referred to our office for evaluation of atypical atrial flutter/atrial fibrillation.    He now presents for 4-week follow-up.  Last office visit scheduled for echocardiogram and nuclear stress test as well as added diltiazem and metoprolol.  Patient has been started on Eliquis 06/19/2021.  Patient subsequently presented for echocardiogram 07/06/2021 with heart rate >130 bpm.  Patient had only been taking metoprolol titrate once daily, he was advised to take it twice daily.  Echocardiogram noted normal LVEF with left atrial cavity moderately dilated and mild MR.  Stress test revealed normal myocardial perfusion and was in the low risk.  EKG today demonstrates patient remains in atrial flutter, however he is now well rate controlled.  Given that stress test is nonischemic and echocardiogram is essentially within normal limits recommend direct-current cardioversion.  Patient is  accompanied by his wife at today's visit.  Discussed alternatives, risks, benefits of direct-current cardioversion.  Patient and his wife both verbalized understanding and all of their questions were addressed.  Patient wishes to proceed with cardioversion.  He is on anticoagulation, and understands the importance of medication compliance.  Set him up for cardioversion and see him back in follow-up afterwards.  Notably he remains compliant with CPAP, encouraged him to continue to do so.  I have refilled his medications today.  Follow-up in approximately 6 weeks after cardioversion.   Kanai Berrios C Eulanda Dorion, PA-C 07/18/2021, 1:28 PM Office: [phone] 

## 2021-07-18 ENCOUNTER — Encounter: Payer: Self-pay | Admitting: Student

## 2021-07-18 ENCOUNTER — Other Ambulatory Visit: Payer: Self-pay

## 2021-07-18 ENCOUNTER — Ambulatory Visit: Payer: Medicare PPO | Admitting: Student

## 2021-07-18 VITALS — BP 124/79 | HR 81 | Temp 97.8°F | Ht 70.0 in | Wt 204.0 lb

## 2021-07-18 DIAGNOSIS — I484 Atypical atrial flutter: Secondary | ICD-10-CM | POA: Diagnosis not present

## 2021-07-18 DIAGNOSIS — I4819 Other persistent atrial fibrillation: Secondary | ICD-10-CM | POA: Diagnosis not present

## 2021-07-18 MED ORDER — APIXABAN 5 MG PO TABS: 5.0000 mg | ORAL_TABLET | Freq: Two times a day (BID) | ORAL | 0 refills | Status: DC

## 2021-07-18 MED ORDER — DILTIAZEM HCL ER COATED BEADS 180 MG PO CP24: 180.0000 mg | ORAL_CAPSULE | Freq: Every day | ORAL | 3 refills | Status: DC

## 2021-07-18 MED ORDER — APIXABAN 5 MG PO TABS: 5.0000 mg | ORAL_TABLET | Freq: Two times a day (BID) | ORAL | 3 refills | Status: DC

## 2021-07-18 MED ORDER — METOPROLOL TARTRATE 25 MG PO TABS: 25.0000 mg | ORAL_TABLET | Freq: Two times a day (BID) | ORAL | 3 refills | Status: DC

## 2021-07-25 ENCOUNTER — Encounter (HOSPITAL_COMMUNITY): Payer: Self-pay | Admitting: Cardiology

## 2021-07-28 DIAGNOSIS — I484 Atypical atrial flutter: Secondary | ICD-10-CM | POA: Diagnosis not present

## 2021-07-29 LAB — BASIC METABOLIC PANEL WITH GFR
BUN/Creatinine Ratio: 13 (ref 10–24)
BUN: 15 mg/dL (ref 8–27)
CO2: 22 mmol/L (ref 20–29)
Calcium: 9.3 mg/dL (ref 8.6–10.2)
Chloride: 102 mmol/L (ref 96–106)
Creatinine, Ser: 1.14 mg/dL (ref 0.76–1.27)
Glucose: 128 mg/dL — ABNORMAL HIGH (ref 70–99)
Potassium: 4.7 mmol/L (ref 3.5–5.2)
Sodium: 137 mmol/L (ref 134–144)
eGFR: 71 mL/min/{1.73_m2}

## 2021-07-29 LAB — CBC
Hematocrit: 40 % (ref 37.5–51.0)
Hemoglobin: 14.4 g/dL (ref 13.0–17.7)
MCH: 31.6 pg (ref 26.6–33.0)
MCHC: 36 g/dL — ABNORMAL HIGH (ref 31.5–35.7)
MCV: 88 fL (ref 79–97)
Platelets: 253 10*3/uL (ref 150–450)
RBC: 4.55 x10E6/uL (ref 4.14–5.80)
RDW: 12.2 % (ref 11.6–15.4)
WBC: 6.1 10*3/uL (ref 3.4–10.8)

## 2021-08-05 ENCOUNTER — Ambulatory Visit (HOSPITAL_COMMUNITY): Payer: Medicare PPO | Admitting: Certified Registered"

## 2021-08-05 ENCOUNTER — Encounter (HOSPITAL_COMMUNITY): Payer: Self-pay | Admitting: Cardiology

## 2021-08-05 ENCOUNTER — Encounter (HOSPITAL_COMMUNITY)
Admission: RE | Disposition: A | Discharge status: Home / Self Care | Payer: Self-pay | Source: Home / Self Care | Attending: Cardiology

## 2021-08-05 ENCOUNTER — Other Ambulatory Visit: Payer: Self-pay

## 2021-08-05 ENCOUNTER — Ambulatory Visit (HOSPITAL_COMMUNITY)
Admission: RE | Admit: 2021-08-05 | Discharge: 2021-08-05 | Disposition: A | Discharge status: Home / Self Care | Payer: Medicare PPO | Attending: Cardiology | Admitting: Cardiology

## 2021-08-05 DIAGNOSIS — I4819 Other persistent atrial fibrillation: Secondary | ICD-10-CM | POA: Insufficient documentation

## 2021-08-05 DIAGNOSIS — Z7901 Long term (current) use of anticoagulants: Secondary | ICD-10-CM | POA: Insufficient documentation

## 2021-08-05 DIAGNOSIS — G4733 Obstructive sleep apnea (adult) (pediatric): Secondary | ICD-10-CM | POA: Insufficient documentation

## 2021-08-05 DIAGNOSIS — I10 Essential (primary) hypertension: Secondary | ICD-10-CM | POA: Diagnosis not present

## 2021-08-05 DIAGNOSIS — I484 Atypical atrial flutter: Secondary | ICD-10-CM | POA: Diagnosis not present

## 2021-08-05 DIAGNOSIS — J45909 Unspecified asthma, uncomplicated: Secondary | ICD-10-CM | POA: Insufficient documentation

## 2021-08-05 DIAGNOSIS — F32A Depression, unspecified: Secondary | ICD-10-CM | POA: Insufficient documentation

## 2021-08-05 DIAGNOSIS — J302 Other seasonal allergic rhinitis: Secondary | ICD-10-CM | POA: Diagnosis not present

## 2021-08-05 HISTORY — PX: CARDIOVERSION: SHX1299

## 2021-08-05 SURGERY — CARDIOVERSION
Anesthesia: General

## 2021-08-05 MED ORDER — SODIUM CHLORIDE 0.9 % IV SOLN: INTRAVENOUS | Status: DC

## 2021-08-05 MED ORDER — PROPOFOL 10 MG/ML IV BOLUS
INTRAVENOUS | Status: DC | PRN
  Administered 2021-08-05: 100 mg via INTRAVENOUS
  Administered 2021-08-05: 30 mg via INTRAVENOUS

## 2021-08-05 MED ORDER — LIDOCAINE HCL (PF) 2 % IJ SOLN
INTRAMUSCULAR | Status: DC | PRN
  Administered 2021-08-05: 40 mg via INTRADERMAL

## 2021-08-05 NOTE — Interval H&P Note (Signed)
History and Physical Interval Note:  08/05/2021 12:12 PM  Jeffery Underwood  has presented today for surgery, with the diagnosis of AFIB.  The various methods of treatment have been discussed with the patient and family. After consideration of risks, benefits and other options for treatment, the patient has consented to  Procedure(s): CARDIOVERSION (N/A) as a surgical intervention.  The patient's history has been reviewed, patient examined, no change in status, stable for surgery.  I have reviewed the patient's chart and labs.  Questions were answered to the patient's satisfaction.     Adrian Prows

## 2021-08-05 NOTE — CV Procedure (Signed)
Direct current cardioversion 08/05/2021 12:38 PM  Indication symptomatic Atypical atrial flutter.  Procedure: Using 100 mg of IV Propofol and 30 IV Lidocaine (for reducing venous pain) for achieving deep sedation, synchronized direct current cardioversion performed. Patient was delivered with 120 Joules of electricity X 1 with success to NSR. Patient tolerated the procedure well. No immediate complication noted.   Allergies as of 08/05/2021   No Known Allergies      Medication List     TAKE these medications    acetaminophen 325 MG tablet Commonly known as: TYLENOL Take 650 mg by mouth every 6 (six) hours as needed.   albuterol 108 (90 Base) MCG/ACT inhaler Commonly known as: VENTOLIN HFA Inhale 2 puffs into the lungs as needed for wheezing or shortness of breath.   apixaban 5 MG Tabs tablet Commonly known as: ELIQUIS Take 1 tablet (5 mg total) by mouth 2 (two) times daily. What changed: Another medication with the same name was removed. Continue taking this medication, and follow the directions you see here.   azelastine 0.05 % ophthalmic solution Commonly known as: OPTIVAR Apply 1 drop to eye in the morning and at bedtime.   budesonide-formoterol 160-4.5 MCG/ACT inhaler Commonly known as: SYMBICORT Inhale 2 puffs into the lungs in the morning and at bedtime.   buPROPion 150 MG 24 hr tablet Commonly known as: WELLBUTRIN XL Take 150 mg by mouth in the morning. 150 mg + 300 mg=450 mg total daily dose   buPROPion 300 MG 24 hr tablet Commonly known as: WELLBUTRIN XL Take 300 mg by mouth in the morning. 300 mg + 150 mg=450 mg total daily dose   cetirizine 10 MG tablet Commonly known as: ZYRTEC Take 10 mg by mouth every evening.   diltiazem 180 MG 24 hr capsule Commonly known as: CARDIZEM CD Take 1 capsule (180 mg total) by mouth daily. What changed: when to take this   FLUoxetine 20 MG tablet Commonly known as: PROZAC Take 20 mg by mouth daily.   fluticasone 50  MCG/ACT nasal spray Commonly known as: FLONASE Place 1 spray into both nostrils 2 (two) times daily.   lisinopril 5 MG tablet Commonly known as: ZESTRIL Take 5 mg by mouth every evening.   metoprolol tartrate 25 MG tablet Commonly known as: LOPRESSOR Take 1 tablet (25 mg total) by mouth 2 (two) times daily.   montelukast 10 MG tablet Commonly known as: SINGULAIR Take 10 mg by mouth at bedtime.   MULTIVITAMIN PO Take 1 tablet by mouth at bedtime.   vitamin C 500 MG tablet Commonly known as: ASCORBIC ACID Take 500 mg by mouth daily as needed (immune health support).   vitamin E 1000 UNIT capsule Take 1,000 Units by mouth at bedtime.          Adrian Prows, MD, [facility] 08/05/2021, 12:38 PM Office: [phone] Fax: [phone] Pager: [phone]

## 2021-08-05 NOTE — Transfer of Care (Signed)
Immediate Anesthesia Transfer of Care Note  Patient: Jeffery Underwood  Procedure(s) Performed: CARDIOVERSION  Patient Location: PACU  Anesthesia Type:General  Level of Consciousness: awake, alert  and oriented  Airway & Oxygen Therapy: Patient Spontanous Breathing and Patient connected to face mask oxygen  Post-op Assessment: Report given to RN and Post -op Vital signs reviewed and stable  Post vital signs: Reviewed and stable  Last Vitals:  Vitals Value Taken Time  BP 104/64 08/05/21 1235  Temp 36.5 C 08/05/21 1235  Pulse 62 08/05/21 1239  Resp 18 08/05/21 1239  SpO2 97 % 08/05/21 1239  Vitals shown include unvalidated device data.  Last Pain:  Vitals:   08/05/21 1235  TempSrc: Oral  PainSc: 0-No pain         Complications: No notable events documented.

## 2021-08-05 NOTE — Anesthesia Preprocedure Evaluation (Signed)
Anesthesia Evaluation  Patient identified by MRN, date of birth, ID band Patient awake    Reviewed: Allergy & Precautions, H&P , NPO status , Patient's Chart, lab work & pertinent test results  Airway Mallampati: II   Neck ROM: full    Dental   Pulmonary asthma , sleep apnea ,    breath sounds clear to auscultation       Cardiovascular + dysrhythmias Atrial Fibrillation  Rhythm:irregular Rate:Normal     Neuro/Psych PSYCHIATRIC DISORDERS Depression    GI/Hepatic   Endo/Other    Renal/GU      Musculoskeletal   Abdominal   Peds  Hematology   Anesthesia Other Findings   Reproductive/Obstetrics                             Anesthesia Physical Anesthesia Plan  ASA: 3  Anesthesia Plan: General   Post-op Pain Management:    Induction: Intravenous  PONV Risk Score and Plan: 1 and Propofol infusion and Treatment may vary due to age or medical condition  Airway Management Planned: Nasal Cannula  Additional Equipment:   Intra-op Plan:   Post-operative Plan:   Informed Consent: I have reviewed the patients History and Physical, chart, labs and discussed the procedure including the risks, benefits and alternatives for the proposed anesthesia with the patient or authorized representative who has indicated his/her understanding and acceptance.     Dental advisory given  Plan Discussed with: CRNA and Anesthesiologist  Anesthesia Plan Comments:         Anesthesia Quick Evaluation

## 2021-08-06 NOTE — Anesthesia Postprocedure Evaluation (Signed)
Anesthesia Post Note  Patient: Jeffery Underwood  Procedure(s) Performed: CARDIOVERSION     Patient location during evaluation: Endoscopy Anesthesia Type: General Level of consciousness: awake and alert Pain management: pain level controlled Vital Signs Assessment: post-procedure vital signs reviewed and stable Respiratory status: spontaneous breathing, nonlabored ventilation, respiratory function stable and patient connected to nasal cannula oxygen Cardiovascular status: blood pressure returned to baseline and stable Postop Assessment: no apparent nausea or vomiting Anesthetic complications: no   No notable events documented.  Last Vitals:  Vitals:   08/05/21 1243 08/05/21 1250  BP: 98/65   Pulse: 61 (!) 57  Resp: 15 11  Temp:    SpO2: 98% 96%    Last Pain:  Vitals:   08/05/21 1243  TempSrc:   PainSc: 0-No pain                 Salathiel Ferrara S

## 2021-08-07 ENCOUNTER — Encounter (HOSPITAL_COMMUNITY): Payer: Self-pay | Admitting: Cardiology

## 2021-08-11 DIAGNOSIS — M542 Cervicalgia: Secondary | ICD-10-CM | POA: Diagnosis not present

## 2021-08-26 DIAGNOSIS — H25812 Combined forms of age-related cataract, left eye: Secondary | ICD-10-CM | POA: Diagnosis not present

## 2021-08-26 DIAGNOSIS — H26493 Other secondary cataract, bilateral: Secondary | ICD-10-CM | POA: Diagnosis not present

## 2021-08-29 ENCOUNTER — Ambulatory Visit: Payer: Medicare PPO | Admitting: Student

## 2021-08-29 ENCOUNTER — Encounter: Payer: Self-pay | Admitting: Student

## 2021-08-29 ENCOUNTER — Other Ambulatory Visit: Payer: Self-pay

## 2021-08-29 VITALS — BP 112/73 | HR 61 | Temp 98.6°F | Ht 70.0 in | Wt 203.8 lb

## 2021-08-29 DIAGNOSIS — I10 Essential (primary) hypertension: Secondary | ICD-10-CM | POA: Diagnosis not present

## 2021-08-29 DIAGNOSIS — I484 Atypical atrial flutter: Secondary | ICD-10-CM

## 2021-08-29 MED ORDER — METOPROLOL TARTRATE 25 MG PO TABS: 12.5000 mg | ORAL_TABLET | Freq: Two times a day (BID) | ORAL | 3 refills | Status: DC

## 2021-08-29 MED ORDER — METOPROLOL TARTRATE 25 MG PO TABS: 12.5000 mg | ORAL_TABLET | Freq: Two times a day (BID) | ORAL | 0 refills | Status: DC

## 2021-08-29 NOTE — Progress Notes (Signed)
Primary Physician/Referring:  Jonathon Jordan, MD  Patient ID: Jeffery Underwood, male    DOB: 06-08-1956, 65 y.o.   MRN: [id number]  Chief Complaint  Patient presents with   Atrial Fibrillation   Follow-up   HPI:    Jeffery Underwood  is a 65 y.o. Caucasian male patient with hypertension, OSA on CPAP and compliant.  Patient was referred to our office for evaluation of atypical atrial flutter/atrial fibrillation.    Patient presents for 6-week follow-up.  Last office visit patient remained in atrial flutter and therefore shared decision was to proceed with cardioversion.  Patient underwent successful direct-current cardioversion 08/05/2021.  Patient has been feeling well since his procedure.  He is currently walking 3 miles 4 times per week without issue.  He is tolerating anticoagulation without bleeding diathesis.  Denies chest pain, palpitations, dyspnea, syncope, near syncope.  Denies orthopnea, PND, leg edema.  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   Right Anterior Neck   OSA (obstructive sleep apnea)    Prediabetes 09/21/2004   resolved 10/2011   Psoriasis    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   Right Sideburn (well diff)   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   Left Sideburn (in situ)   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   Left [place] (well diff)   Seasonal allergies    Past Surgical History:  Procedure Laterality Date   CARDIOVERSION N/A 08/05/2021   Procedure: CARDIOVERSION;  Surgeon: Adrian Prows, MD;  Location: [facility] ENDOSCOPY;  Service: Cardiovascular;  Laterality: N/A;   INGUINAL HERNIA REPAIR     right side (4-5 years ago)   Family History  Problem Relation Age of Onset   Depression Mother    Tuberculosis Mother    Osteoporosis Mother    Diabetes type II Father     Atrial fibrillation Father     Social History   Tobacco Use   Smoking status: Never   Smokeless tobacco: Never  Substance Use Topics   Alcohol use: Yes    Alcohol/week: 14.0 standard drinks    Types: 14 Cans of beer per week    Comment: 3-5 per week   Marital Status: Married  ROS  Review of Systems  Constitutional: Negative for malaise/fatigue (resolved), weight gain and weight loss.  Cardiovascular:  Negative for chest pain, claudication, dyspnea on exertion, leg swelling, near-syncope, orthopnea, palpitations, paroxysmal nocturnal dyspnea and syncope.  Respiratory:  Negative for shortness of breath.   Gastrointestinal:  Negative for melena.  Neurological:  Negative for dizziness.  Objective  Blood pressure 112/73, pulse 61, temperature 98.6 F (37 C), temperature source Temporal, height 5' 10"  (1.778 m), weight 203 lb 12.8 oz (92.4 kg), SpO2 98 %. Body mass index is 29.24 kg/m.  Vitals with BMI 08/29/2021 08/05/2021 08/05/2021  Height 5' 10"  - -  Weight 203 lbs 13 oz - -  BMI 76.80 - -  Systolic 881 - 98  Diastolic 73 - 65  Pulse 61 57 61    Physical Exam Vitals reviewed.  Neck:     Vascular: No carotid bruit or JVD.  Cardiovascular:     Rate and Rhythm: Normal rate and regular rhythm.     Pulses: Intact distal pulses.     Heart sounds: Normal heart sounds, S1 normal and  S2 normal. No murmur heard.   No gallop.  Pulmonary:     Effort: Pulmonary effort is normal. No respiratory distress.     Breath sounds: Normal breath sounds. No wheezing, rhonchi or rales.  Musculoskeletal:     Right lower leg: No edema.     Left lower leg: No edema.     Laboratory examination:   Recent Labs    07/28/21 1118  NA 137  K 4.7  CL 102  CO2 22  GLUCOSE 128*  BUN 15  CREATININE 1.14  CALCIUM 9.3   CrCl cannot be calculated (Patient's most recent lab result is older than the maximum 21 days allowed.).  CMP Latest Ref Rng & Units 07/28/2021 11/29/2017 03/27/2012  Glucose 70 -  99 mg/dL 128(H) 95 126(H)  BUN 8 - 27 mg/dL 15 12 22   Creatinine 0.76 - 1.27 mg/dL 1.14 1.07 1.34  Sodium 134 - 144 mmol/L 137 139 140  Potassium 3.5 - 5.2 mmol/L 4.7 3.6 3.8  Chloride 96 - 106 mmol/L 102 103 105  CO2 20 - 29 mmol/L 22 26 25   Calcium 8.6 - 10.2 mg/dL 9.3 8.9 9.2   CBC Latest Ref Rng & Units 07/28/2021 11/29/2017 03/27/2012  WBC 3.4 - 10.8 x10E3/uL 6.1 5.7 11.5(H)  Hemoglobin 13.0 - 17.7 g/dL 14.4 14.2 14.0  Hematocrit 37.5 - 51.0 % 40.0 39.6 39.5  Platelets 150 - 450 x10E3/uL 253 230 166    Lipid Panel No results for input(s): CHOL, TRIG, LDLCALC, VLDL, HDL, CHOLHDL, LDLDIRECT in the last 8760 hours. Lipid Panel  No results found for: CHOL, TRIG, HDL, CHOLHDL, VLDL, LDLCALC, LDLDIRECT, LABVLDL   HEMOGLOBIN A1C No results found for: HGBA1C, MPG TSH No results for input(s): TSH in the last 8760 hours.  External labs:   06/17/2021: A1c 5.4%.  TSH normal at 1.17.  Total cholesterol 158, triglycerides 194, HDL 41, LDL 85.  Non-HDL cholesterol 118.  Hb 14.5/HCT 41.0, platelets 221.  Serum glucose 110 mg, BUN 14, creatinine 1.11, EGFR >60 mL.  Potassium 4.4.  CMP otherwise normal.  Medications and allergies  No Known Allergies   Medication prior to this encounter:   Outpatient Medications Prior to Visit  Medication Sig Dispense Refill   acetaminophen (TYLENOL) 325 MG tablet Take 650 mg by mouth every 6 (six) hours as needed.     albuterol (PROVENTIL HFA;VENTOLIN HFA) 108 (90 BASE) MCG/ACT inhaler Inhale 2 puffs into the lungs as needed for wheezing or shortness of breath.     apixaban (ELIQUIS) 5 MG TABS tablet Take 1 tablet (5 mg total) by mouth 2 (two) times daily. 60 tablet 0   azelastine (OPTIVAR) 0.05 % ophthalmic solution Apply 1 drop to eye in the morning and at bedtime.     b complex vitamins capsule Take 1 capsule by mouth daily.     budesonide-formoterol (SYMBICORT) 160-4.5 MCG/ACT inhaler Inhale 2 puffs into the lungs in the morning and at bedtime.      buPROPion (WELLBUTRIN XL) 300 MG 24 hr tablet Take 300 mg by mouth in the morning.     cetirizine (ZYRTEC) 10 MG tablet Take 10 mg by mouth every evening.     diltiazem (CARDIZEM CD) 180 MG 24 hr capsule Take 1 capsule (180 mg total) by mouth daily. (Patient taking differently: Take 180 mg by mouth every evening.) 30 capsule 3   FLUoxetine (PROZAC) 20 MG tablet Take 20 mg by mouth daily.     fluticasone (FLONASE) 50 MCG/ACT nasal spray Place  1 spray into both nostrils 2 (two) times daily.  1   lisinopril (ZESTRIL) 5 MG tablet Take 5 mg by mouth every evening.     montelukast (SINGULAIR) 10 MG tablet Take 10 mg by mouth at bedtime.     Multiple Vitamins-Minerals (MULTIVITAMIN PO) Take 1 tablet by mouth at bedtime.     vitamin C (ASCORBIC ACID) 500 MG tablet Take 500 mg by mouth daily as needed (immune health support).     vitamin E 1000 UNIT capsule Take 1,000 Units by mouth at bedtime.     metoprolol tartrate (LOPRESSOR) 25 MG tablet Take 1 tablet (25 mg total) by mouth 2 (two) times daily. 60 tablet 3   buPROPion (WELLBUTRIN XL) 150 MG 24 hr tablet Take 150 mg by mouth in the morning. 150 mg + 300 mg=450 mg total daily dose     No facility-administered medications prior to visit.    Medication list after today's encounter   Current Outpatient Medications  Medication Instructions   acetaminophen (TYLENOL) 650 mg, Oral, Every 6 hours PRN   albuterol (PROVENTIL HFA;VENTOLIN HFA) 108 (90 BASE) MCG/ACT inhaler 2 puffs, Inhalation, As needed   apixaban (ELIQUIS) 5 mg, Oral, 2 times daily   azelastine (OPTIVAR) 0.05 % ophthalmic solution 1 drop, Ophthalmic, 2 times daily   b complex vitamins capsule 1 capsule, Oral, Daily   budesonide-formoterol (SYMBICORT) 160-4.5 MCG/ACT inhaler 2 puffs, Inhalation, 2 times daily   buPROPion (WELLBUTRIN XL) 300 mg, Oral, Every morning   cetirizine (ZYRTEC) 10 mg, Oral, Every evening   diltiazem (CARDIZEM CD) 180 mg, Oral, Daily   FLUoxetine (PROZAC) 20 mg,  Oral, Daily   fluticasone (FLONASE) 50 MCG/ACT nasal spray 1 spray, Each Nare, 2 times daily   lisinopril (ZESTRIL) 5 mg, Oral, Every evening   metoprolol tartrate (LOPRESSOR) 12.5 mg, Oral, 2 times daily   montelukast (SINGULAIR) 10 mg, Oral, Daily at bedtime   Multiple Vitamins-Minerals (MULTIVITAMIN PO) 1 tablet, Oral, Daily at bedtime   vitamin C (ASCORBIC ACID) 500 mg, Oral, Daily PRN   vitamin E 1,000 Units, Oral, Daily at bedtime   Radiology:   No results found.  Cardiac Studies:  Direct current cardioversion 08/05/2021 12:38 PM Indication symptomatic Atypical atrial flutter. Procedure: Using 100 mg of IV Propofol and 30 IV Lidocaine (for reducing venous pain) for achieving deep sedation, synchronized direct current cardioversion performed. Patient was delivered with 120 Joules of electricity X 1 with success to NSR. Patient tolerated the procedure well. No immediate complication noted  PCV ECHOCARDIOGRAM COMPLETE 07/09/2021 Left ventricle cavity is normal in size. Mild concentric hypertrophy of the left ventricle. Normal global wall motion. Normal LV systolic function with visual EF 50-55%. Indeterminate diastolic filling pattern. Left atrial cavity is moderately dilated. Mild (Grade I) mitral regurgitation. Estimated right atrial pressure 8 mmHg.   PCV MYOCARDIAL PERFUSION WO LEXISCAN 07/07/2021 Exercise nuclear stress test was performed using Bruce protocol. Patient reached 6 METS, and 97% of age predicted maximum heart rate. Exercise capacity was fair. No chest pain reported, dyspnea reported. Heart rate and hemodynamic response were normal. Rest and stress EKG showed Afib w/RVR, no significant ST-T changes. Normal myocardial perfusion. Stress LVEF 66%.  EKG:  08/29/2021: Sinus rhythm at a rate of 59 bpm.  Normal axis.  Incomplete right bundle branch block.  No evidence of ischemia or underlying injury pattern.  07/18/21: Atypical atrial flutter with controlled ventricular  response at a rate of 71 bpm.  EKG 06/20/2021: Atypical atrial flutter with rapid  ventricular response at rate of 104 bpm, normal axis, incomplete right bundle branch block.  Nonspecific T abnormality.  EKG 06/05/2020: Sinus rhythm with first-degree block at rate of 65 bpm, otherwise normal EKG.  Assessment     ICD-10-CM   1. Atypical atrial flutter (HCC)  I48.4 EKG 12-Lead    2. Primary hypertension  I10      CHA2DS2-VASc Score is 2.  Yearly risk of stroke: 2.3% (A, HTN).  Score of 1=0.6; 2=2.2; 3=3.2; 4=4.8; 5=7.2; 6=9.8; 7=>9.8) -(CHF; HTN; vasc disease DM,  Male = 1; Age <65 =0; 65-74 = 1,  >75 =2; stroke/embolism= 2).    Medications Discontinued During This Encounter  Medication Reason   buPROPion (WELLBUTRIN XL) 150 MG 24 hr tablet Dose change   metoprolol tartrate (LOPRESSOR) 25 MG tablet Reorder   metoprolol tartrate (LOPRESSOR) 25 MG tablet Reorder    Meds ordered this encounter  Medications   DISCONTD: metoprolol tartrate (LOPRESSOR) 25 MG tablet    Sig: Take 0.5 tablets (12.5 mg total) by mouth 2 (two) times daily.    Dispense:  30 tablet    Refill:  0   metoprolol tartrate (LOPRESSOR) 25 MG tablet    Sig: Take 0.5 tablets (12.5 mg total) by mouth 2 (two) times daily.    Dispense:  90 tablet    Refill:  3    Orders Placed This Encounter  Procedures   EKG 12-Lead   Recommendations:   Griff Badley is a 65 y.o. Caucasian male patient with hypertension, OSA on CPAP and compliant.  Patient was referred to our office for evaluation of atypical atrial flutter/atrial fibrillation. Subsequent  echocardiogram noted normal LVEF with left atrial cavity moderately dilated and mild MR.  Stress test revealed normal myocardial perfusion and was in the low risk.   Patient presents for 6-week follow-up after cardioversion.  EKG today reveals patient is maintaining normal sinus rhythm.  Patient is feeling well overall with significant improvement of exercise capacity.  Encourage patient  to continue to maintain physical activity.  Patient's blood pressure is soft at today's office visit We will therefore reduce metoprolol to tartrate from 25 mg to 12.5 mg p.o. twice daily.  She is tolerating anticoagulation without bleeding diathesis, will continue Eliquis.  Patient is otherwise stable from a cardiovascular standpoint.  Follow-up in 6 months, sooner if needed, for atrial flutter and hypertension.   Alethia Berthold, PA-C 08/29/2021, 1:50 PM Office: [phone]

## 2021-09-08 ENCOUNTER — Telehealth: Payer: Self-pay | Admitting: Student

## 2021-09-08 ENCOUNTER — Other Ambulatory Visit: Payer: Self-pay

## 2021-09-08 DIAGNOSIS — I484 Atypical atrial flutter: Secondary | ICD-10-CM

## 2021-09-08 MED ORDER — DILTIAZEM HCL ER COATED BEADS 180 MG PO CP24: 180.0000 mg | ORAL_CAPSULE | Freq: Every day | ORAL | 3 refills | Status: DC

## 2021-09-08 NOTE — Telephone Encounter (Signed)
Patient requesting refill for diltiazem. Says there's an issue with pharmacy and pharmacy won't refill. Please call him back once able to refill it.

## 2021-09-08 NOTE — Telephone Encounter (Signed)
Called pt, pt voiced understanding.

## 2021-10-15 ENCOUNTER — Other Ambulatory Visit: Payer: Self-pay

## 2021-10-15 ENCOUNTER — Ambulatory Visit (INDEPENDENT_AMBULATORY_CARE_PROVIDER_SITE_OTHER): Payer: Medicare PPO | Admitting: Physician Assistant

## 2021-10-15 ENCOUNTER — Encounter: Payer: Self-pay | Admitting: Physician Assistant

## 2021-10-15 DIAGNOSIS — L570 Actinic keratosis: Secondary | ICD-10-CM

## 2021-10-15 DIAGNOSIS — Z85828 Personal history of other malignant neoplasm of skin: Secondary | ICD-10-CM

## 2021-10-15 MED ORDER — TRIAMCINOLONE ACETONIDE 0.1 % EX CREA: TOPICAL_CREAM | CUTANEOUS | 0 refills | Status: DC

## 2021-10-15 MED ORDER — TOLAK 4 % EX CREA: TOPICAL_CREAM | CUTANEOUS | 0 refills | Status: DC

## 2021-10-15 NOTE — Patient Instructions (Addendum)
[place] @ [phone]

## 2021-10-17 ENCOUNTER — Encounter: Payer: Self-pay | Admitting: Physician Assistant

## 2021-10-17 NOTE — Progress Notes (Signed)
° °  Follow-Up Visit   Subjective  Jeffery Underwood is a 66 y.o. male who presents for the following: Annual Exam (Left temple tender crusty lesion x   months. History of non mole skin cancer. ).   The following portions of the chart were reviewed this encounter and updated as appropriate:  Tobacco   Allergies   Meds   Problems   Med Hx   Surg Hx   Fam Hx       Objective  Well appearing patient in no apparent distress; mood and affect are within normal limits.  A focused examination was performed including face. Relevant physical exam findings are noted in the Assessment and Plan.  Head - Anterior (Face) Large erythematous patches with gritty scale.          Assessment & Plan  AK (actinic keratosis) Head - Anterior (Face)  triamcinolone cream (KENALOG) 0.1 % - Head - Anterior (Face) APPLY TO AFFECTED AREA IN THE MORNING x 2 weeks.  Fluorouracil (TOLAK) 4 % CREA - Head - Anterior (Face) APPLY TO AFFECTED AREA NIGHTLY x 2 weeks    I, Jkai Arwood, PA-C, have reviewed all documentation's for this visit.  The documentation on 10/17/21 for the exam, diagnosis, procedures and orders are all accurate and complete.

## 2021-10-17 NOTE — Progress Notes (Deleted)
° °  Follow-Up Visit   Subjective  Jeffery Underwood is a 66 y.o. male who presents for the following: Annual Exam (Left temple tender crusty lesion x   months. History of non mole skin cancer. ).   The following portions of the chart were reviewed this encounter and updated as appropriate:  Tobacco   Allergies   Meds   Problems   Med Hx   Surg Hx   Fam Hx       Objective  Well appearing patient in no apparent distress; mood and affect are within normal limits.  A focused examination was performed including face. Relevant physical exam findings are noted in the Assessment and Plan.  Head - Anterior (Face) Large erythematous patches with gritty scale.          Assessment & Plan  AK (actinic keratosis) Head - Anterior (Face)  triamcinolone cream (KENALOG) 0.1 % - Head - Anterior (Face) APPLY TO AFFECTED AREA IN THE MORNING x 2 weeks.  Fluorouracil (TOLAK) 4 % CREA - Head - Anterior (Face) APPLY TO AFFECTED AREA NIGHTLY x 2 weeks    I, Tenlee Wollin, PA-C, have reviewed all documentation's for this visit.  The documentation on 10/17/21 for the exam, diagnosis, procedures and orders are all accurate and complete.

## 2021-11-17 DIAGNOSIS — H52223 Regular astigmatism, bilateral: Secondary | ICD-10-CM | POA: Diagnosis not present

## 2021-11-17 DIAGNOSIS — H43813 Vitreous degeneration, bilateral: Secondary | ICD-10-CM | POA: Diagnosis not present

## 2021-11-17 DIAGNOSIS — H35033 Hypertensive retinopathy, bilateral: Secondary | ICD-10-CM | POA: Diagnosis not present

## 2021-11-17 DIAGNOSIS — H43393 Other vitreous opacities, bilateral: Secondary | ICD-10-CM | POA: Diagnosis not present

## 2021-11-17 DIAGNOSIS — H524 Presbyopia: Secondary | ICD-10-CM | POA: Diagnosis not present

## 2021-11-17 DIAGNOSIS — H43822 Vitreomacular adhesion, left eye: Secondary | ICD-10-CM | POA: Diagnosis not present

## 2021-11-17 DIAGNOSIS — H43811 Vitreous degeneration, right eye: Secondary | ICD-10-CM | POA: Diagnosis not present

## 2021-11-17 DIAGNOSIS — H5203 Hypermetropia, bilateral: Secondary | ICD-10-CM | POA: Diagnosis not present

## 2021-11-17 DIAGNOSIS — H4311 Vitreous hemorrhage, right eye: Secondary | ICD-10-CM | POA: Diagnosis not present

## 2021-11-21 DIAGNOSIS — U071 COVID-19: Secondary | ICD-10-CM | POA: Diagnosis not present

## 2021-12-02 DIAGNOSIS — H43811 Vitreous degeneration, right eye: Secondary | ICD-10-CM | POA: Diagnosis not present

## 2021-12-02 DIAGNOSIS — H35033 Hypertensive retinopathy, bilateral: Secondary | ICD-10-CM | POA: Diagnosis not present

## 2021-12-02 DIAGNOSIS — H43822 Vitreomacular adhesion, left eye: Secondary | ICD-10-CM | POA: Diagnosis not present

## 2021-12-02 DIAGNOSIS — H4311 Vitreous hemorrhage, right eye: Secondary | ICD-10-CM | POA: Diagnosis not present

## 2021-12-22 DIAGNOSIS — H4311 Vitreous hemorrhage, right eye: Secondary | ICD-10-CM | POA: Diagnosis not present

## 2021-12-22 DIAGNOSIS — H35032 Hypertensive retinopathy, left eye: Secondary | ICD-10-CM | POA: Diagnosis not present

## 2021-12-22 DIAGNOSIS — H43811 Vitreous degeneration, right eye: Secondary | ICD-10-CM | POA: Diagnosis not present

## 2022-01-07 ENCOUNTER — Ambulatory Visit (INDEPENDENT_AMBULATORY_CARE_PROVIDER_SITE_OTHER): Payer: Medicare PPO | Admitting: Physician Assistant

## 2022-01-07 DIAGNOSIS — D492 Neoplasm of unspecified behavior of bone, soft tissue, and skin: Secondary | ICD-10-CM

## 2022-01-07 DIAGNOSIS — L570 Actinic keratosis: Secondary | ICD-10-CM | POA: Diagnosis not present

## 2022-01-08 ENCOUNTER — Encounter: Payer: Self-pay | Admitting: Physician Assistant

## 2022-01-08 NOTE — Progress Notes (Signed)
? ?  Follow-Up Visit ?  ?Subjective  ?Jeffery Underwood is a 66 y.o. male who presents for the following: Follow-up (Here for to[place]ak follow up on face. Patient had a good reaction. No concerns. ). ? ? ?The following portions of the chart were reviewed this encounter and updated as appropriate:  Tobacco  Allergies  Meds  Problems  Med Hx  Surg Hx  Fam Hx   ?  ? ?Objective  ?Well appearing patient in no apparent distress; mood and affect are within normal limits. ? ?A focused examination was performed including face. Relevant physical exam findings are noted in the Assessment and Plan. ? ?Head - Anterior (Face) (3) ?Erythematous patches with gritty scale. ? ?Right top of scalp ? ? ? ? ?left side of nose ? ? ? ? ?Left Temporal Scalp ? ? ? ? ?Left cheek/outer eye ? ? ? ? ?Left Scaphoid Fossa ? ? ? ? ? ?Assessment & Plan  ?AK (actinic keratosis) (3) ?Head - Anterior (Face) ? ?Destruction of lesion - Head - Anterior (Face) ?Complexity: simple   ?Destruction method: cryotherapy   ?Informed consent: discussed and consent obtained   ?Timeout:  patient name, date of birth, surgical site, and procedure verified ?Lesion destroyed using liquid nitrogen: Yes   ?Cryotherapy cycles:  1 ?Outcome: patient tolerated procedure well with no complications   ?Post-procedure details: wound care instructions given   ? ?Related Medications ?triamcinolone cream (KENALOG) 0.1 % ?APPLY TO AFFECTED AREA IN THE MORNING x 2 weeks. ? ?Fluorouracil (TOLAK) 4 % CREA ?APPLY TO AFFECTED AREA NIGHTLY x 2 weeks ? ?Neoplasm of skin (5) ?Right top of scalp ? ?left side of nose ? ?Left Temporal Scalp ? ?Left cheek/outer eye ? ?Left Scaphoid Fossa ? ?Defer biopsy's for today. Patient will reschedule OV for Bx.  ? ? ? ?I, Selim Durden, PA-C, have reviewed all documentation's for this visit.  The documentation on 01/08/22 for the exam, diagnosis, procedures and orders are all accurate and complete. ?

## 2022-01-19 ENCOUNTER — Other Ambulatory Visit: Payer: Self-pay | Admitting: Student

## 2022-01-19 DIAGNOSIS — I484 Atypical atrial flutter: Secondary | ICD-10-CM

## 2022-02-04 NOTE — Telephone Encounter (Signed)
From pt

## 2022-02-09 DIAGNOSIS — H4311 Vitreous hemorrhage, right eye: Secondary | ICD-10-CM | POA: Diagnosis not present

## 2022-02-09 DIAGNOSIS — H35033 Hypertensive retinopathy, bilateral: Secondary | ICD-10-CM | POA: Diagnosis not present

## 2022-02-09 DIAGNOSIS — H43811 Vitreous degeneration, right eye: Secondary | ICD-10-CM | POA: Diagnosis not present

## 2022-02-18 NOTE — Progress Notes (Unsigned)
Primary Physician/Referring:  Jonathon Jordan, MD  Patient ID: Jeffery Underwood, male    DOB: [birth year], 66 y.o.   MRN: [id number]  No chief complaint on file.  HPI:    Jeffery Underwood  is a 66 y.o. Caucasian male patient with hypertension, OSA on CPAP and compliant.  Patient was referred to our office for evaluation of atypical atrial flutter/atrial fibrillation.    Patient was last seen in our office 08/29/2021 at which time given soft blood pressure Lopressor was reduced from 25 mg to 12.5 mg BID. Patient now presents for 6 month follow up. ***  ***  Patient presents for 6-week follow-up.  Last office visit patient remained in atrial flutter and therefore shared decision was to proceed with cardioversion.  Patient underwent successful direct-current cardioversion 08/05/2021.  Patient has been feeling well since his procedure.  He is currently walking 3 miles 4 times per week without issue.  He is tolerating anticoagulation without bleeding diathesis.  Denies chest pain, palpitations, dyspnea, syncope, near syncope.  Denies orthopnea, PND, leg edema.  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   Right Anterior Neck   OSA (obstructive sleep apnea)    Prediabetes 09/21/2004   resolved 10/2011   Psoriasis    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   Right Sideburn (well diff)   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   Left Sideburn (in situ)   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   Left Ala Nasi (well diff)   Seasonal allergies    Past Surgical History:  Procedure Laterality Date   CARDIOVERSION N/A 08/05/2021   Procedure: CARDIOVERSION;  Surgeon: Adrian Prows, MD;  Location: [facility] ENDOSCOPY;  Service: Cardiovascular;  Laterality: N/A;   INGUINAL HERNIA REPAIR     right side (4-5 years ago)   Family  History  Problem Relation Age of Onset   Depression Mother    Tuberculosis Mother    Osteoporosis Mother    Diabetes type II Father    Atrial fibrillation Father     Social History   Tobacco Use   Smoking status: Never   Smokeless tobacco: Never  Substance Use Topics   Alcohol use: Yes    Alcohol/week: 14.0 standard drinks    Types: 14 Cans of beer per week    Comment: 3-5 per week   Marital Status: Married  ROS  Review of Systems  Constitutional: Negative for malaise/fatigue (resolved), weight gain and weight loss.  Cardiovascular:  Negative for chest pain, claudication, dyspnea on exertion, leg swelling, near-syncope, orthopnea, palpitations, paroxysmal nocturnal dyspnea and syncope.  Respiratory:  Negative for shortness of breath.   Gastrointestinal:  Negative for melena.  Neurological:  Negative for dizziness.   Objective  There were no vitals taken for this visit. There is no height or weight on file to calculate BMI.     08/29/2021    9:37 AM 08/05/2021   12:50 PM 08/05/2021   12:43 PM  Vitals with BMI  Height _0     Weight 203 lbs 13 oz    BMI 01.74    Systolic 944  98  Diastolic 73  65  Pulse 61 57 61    Physical Exam Vitals reviewed.  Neck:     Vascular: No carotid bruit or JVD.  Cardiovascular:  Rate and Rhythm: Normal rate and regular rhythm.     Pulses: Intact distal pulses.     Heart sounds: Normal heart sounds, S1 normal and S2 normal. No murmur heard.   No gallop.  Pulmonary:     Effort: Pulmonary effort is normal. No respiratory distress.     Breath sounds: Normal breath sounds. No wheezing, rhonchi or rales.  Musculoskeletal:     Right lower leg: No edema.     Left lower leg: No edema.     Laboratory examination:   Recent Labs    07/28/21 1118  NA 137  K 4.7  CL 102  CO2 22  GLUCOSE 128*  BUN 15  CREATININE 1.14  CALCIUM 9.3    CrCl cannot be calculated (Patient's most recent lab result is older than the maximum 21 days  allowed.).     Latest Ref Rng & Units 07/28/2021   11:18 AM 11/29/2017   10:36 PM 03/27/2012    7:30 AM  CMP  Glucose 70 - 99 mg/dL 128   95   126    BUN 8 - 27 mg/dL _0 Creatinine 0.76 - 1.27 mg/dL 1.14   1.07   1.34    Sodium 134 - 144 mmol/L 137   139   140    Potassium 3.5 - 5.2 mmol/L 4.7   3.6   3.8    Chloride 96 - 106 mmol/L 102   103   105    CO2 20 - 29 mmol/L _1 Calcium 8.6 - 10.2 mg/dL 9.3   8.9   9.2        Latest Ref Rng & Units 07/28/2021   11:18 AM 11/29/2017   10:36 PM 03/27/2012    7:30 AM  CBC  WBC 3.4 - 10.8 x10E3/uL 6.1   5.7   11.5    Hemoglobin 13.0 - 17.7 g/dL 14.4   14.2   14.0    Hematocrit 37.5 - 51.0 % 40.0   39.6   39.5    Platelets 150 - 450 x10E3/uL 253   230   166      Lipid Panel No results for input(s): CHOL, TRIG, LDLCALC, VLDL, HDL, CHOLHDL, LDLDIRECT in the last 8760 hours. Lipid Panel  No results found for: CHOL, TRIG, HDL, CHOLHDL, VLDL, LDLCALC, LDLDIRECT, LABVLDL   HEMOGLOBIN A1C No results found for: HGBA1C, MPG TSH No results for input(s): TSH in the last 8760 hours.  External labs:   06/17/2021: A1c 5.4%.  TSH normal at 1.17.  Total cholesterol 158, triglycerides 194, HDL 41, LDL 85.  Non-HDL cholesterol 118.  Hb 14.5/HCT 41.0, platelets 221.  Serum glucose 110 mg, BUN 14, creatinine 1.11, EGFR >60 mL.  Potassium 4.4.  CMP otherwise normal.  Medications and allergies  No Known Allergies   Medication prior to this encounter:   Outpatient Medications Prior to Visit  Medication Sig Dispense Refill   acetaminophen (TYLENOL) 325 MG tablet Take 650 mg by mouth every 6 (six) hours as needed.     albuterol (PROVENTIL HFA;VENTOLIN HFA) 108 (90 BASE) MCG/ACT inhaler Inhale 2 puffs into the lungs as needed for wheezing or shortness of breath.     apixaban (ELIQUIS) 5 MG TABS tablet Take 1 tablet (5 mg total) by mouth 2 (two) times daily. 60 tablet 0   azelastine (OPTIVAR) 0.05 % ophthalmic solution Apply 1  drop to eye  in the morning and at bedtime.     b complex vitamins capsule Take 1 capsule by mouth daily.     budesonide-formoterol (SYMBICORT) 160-4.5 MCG/ACT inhaler Inhale 2 puffs into the lungs in the morning and at bedtime.     buPROPion (WELLBUTRIN XL) 300 MG 24 hr tablet Take 300 mg by mouth in the morning.     cetirizine (ZYRTEC) 10 MG tablet Take 10 mg by mouth every evening.     diltiazem (CARDIZEM CD) 180 MG 24 hr capsule TAKE 1 CAPSULE EVERY DAY 90 capsule 0   Fluorouracil (TOLAK) 4 % CREA APPLY TO AFFECTED AREA NIGHTLY x 2 weeks 40 g 0   FLUoxetine (PROZAC) 20 MG tablet Take 20 mg by mouth daily.     fluticasone (FLONASE) 50 MCG/ACT nasal spray Place 1 spray into both nostrils 2 (two) times daily.  1   lisinopril (ZESTRIL) 5 MG tablet Take 5 mg by mouth every evening.     metoprolol tartrate (LOPRESSOR) 25 MG tablet Take 0.5 tablets (12.5 mg total) by mouth 2 (two) times daily. 90 tablet 3   montelukast (SINGULAIR) 10 MG tablet Take 10 mg by mouth at bedtime.     Multiple Vitamins-Minerals (MULTIVITAMIN PO) Take 1 tablet by mouth at bedtime.     triamcinolone cream (KENALOG) 0.1 % APPLY TO AFFECTED AREA IN THE MORNING x 2 weeks. 80 g 0   vitamin C (ASCORBIC ACID) 500 MG tablet Take 500 mg by mouth daily as needed (immune health support).     vitamin E 1000 UNIT capsule Take 1,000 Units by mouth at bedtime.     No facility-administered medications prior to visit.    Medication list after today's encounter   Current Outpatient Medications  Medication Instructions   acetaminophen (TYLENOL) 650 mg, Oral, Every 6 hours PRN   albuterol (PROVENTIL HFA;VENTOLIN HFA) 108 (90 BASE) MCG/ACT inhaler 2 puffs, Inhalation, As needed   apixaban (ELIQUIS) 5 mg, Oral, 2 times daily   azelastine (OPTIVAR) 0.05 % ophthalmic solution 1 drop, Ophthalmic, 2 times daily   b complex vitamins capsule 1 capsule, Oral, Daily   budesonide-formoterol (SYMBICORT) 160-4.5 MCG/ACT inhaler 2 puffs, Inhalation,  2 times daily   buPROPion (WELLBUTRIN XL) 300 mg, Oral, Every morning   cetirizine (ZYRTEC) 10 mg, Oral, Every evening   diltiazem (CARDIZEM CD) 180 MG 24 hr capsule TAKE 1 CAPSULE EVERY DAY   Fluorouracil (TOLAK) 4 % CREA APPLY TO AFFECTED AREA NIGHTLY x 2 weeks   FLUoxetine (PROZAC) 20 mg, Oral, Daily   fluticasone (FLONASE) 50 MCG/ACT nasal spray 1 spray, Each Nare, 2 times daily   lisinopril (ZESTRIL) 5 mg, Oral, Every evening   metoprolol tartrate (LOPRESSOR) 12.5 mg, Oral, 2 times daily   montelukast (SINGULAIR) 10 mg, Oral, Daily at bedtime   Multiple Vitamins-Minerals (MULTIVITAMIN PO) 1 tablet, Oral, Daily at bedtime   triamcinolone cream (KENALOG) 0.1 % APPLY TO AFFECTED AREA IN THE MORNING x 2 weeks.   vitamin C (ASCORBIC ACID) 500 mg, Oral, Daily PRN   vitamin E 1,000 Units, Oral, Daily at bedtime   Radiology:   No results found.  Cardiac Studies:  Direct current cardioversion 08/05/2021 12:38 PM Indication symptomatic Atypical atrial flutter. Procedure: Using 100 mg of IV Propofol and 30 IV Lidocaine (for reducing venous pain) for achieving deep sedation, synchronized direct current cardioversion performed. Patient was delivered with 120 Joules of electricity X 1 with success to NSR. Patient tolerated the procedure well. No immediate complication noted  PCV ECHOCARDIOGRAM COMPLETE 07/09/2021 Left ventricle cavity is normal in size. Mild concentric hypertrophy of the left ventricle. Normal global wall motion. Normal LV systolic function with visual EF 50-55%. Indeterminate diastolic filling pattern. Left atrial cavity is moderately dilated. Mild (Grade I) mitral regurgitation. Estimated right atrial pressure 8 mmHg.   PCV MYOCARDIAL PERFUSION WO LEXISCAN 07/07/2021 Exercise nuclear stress test was performed using Bruce protocol. Patient reached 6 METS, and 97% of age predicted maximum heart rate. Exercise capacity was fair. No chest pain reported, dyspnea reported. Heart  rate and hemodynamic response were normal. Rest and stress EKG showed Afib w/RVR, no significant ST-T changes. Normal myocardial perfusion. Stress LVEF 66%.  EKG:  ***   08/29/2021: Sinus rhythm at a rate of 59 bpm.  Normal axis.  Incomplete right bundle branch block.  No evidence of ischemia or underlying injury pattern.  07/18/21: Atypical atrial flutter with controlled ventricular response at a rate of 71 bpm.  EKG 06/20/2021: Atypical atrial flutter with rapid ventricular response at rate of 104 bpm, normal axis, incomplete right bundle branch block.  Nonspecific T abnormality.  EKG 06/05/2020: Sinus rhythm with first-degree block at rate of 65 bpm, otherwise normal EKG.  Assessment   No diagnosis found.  CHA2DS2-VASc Score is 2.  Yearly risk of stroke: 2.3% (A, HTN).  Score of 1=0.6; 2=2.2; 3=3.2; 4=4.8; 5=7.2; 6=9.8; 7=>9.8) -(CHF; HTN; vasc disease DM,  Male = 1; Age <65 =0; 65-74 = 1,  >75 =2; stroke/embolism= 2).    There are no discontinued medications.   No orders of the defined types were placed in this encounter.   No orders of the defined types were placed in this encounter.  Recommendations:   Jimie Kuwahara is a 66 y.o. Caucasian male patient with hypertension, OSA on CPAP and compliant.  Patient was referred to our office for evaluation of atypical atrial flutter/atrial fibrillation. Subsequent  echocardiogram noted normal LVEF with left atrial cavity moderately dilated and mild MR.  Stress test revealed normal myocardial perfusion and was in the low risk.   Patient was last seen in our office 08/29/2021 at which time given soft blood pressure Lopressor was reduced from 25 mg to 12.5 mg BID. Patient now presents for 6 month follow up. ***  ***  Patient presents for 6-week follow-up after cardioversion.  EKG today reveals patient is maintaining normal sinus rhythm.  Patient is feeling well overall with significant improvement of exercise capacity.  Encourage patient to  continue to maintain physical activity.  Patient's blood pressure is soft at today's office visit We will therefore reduce metoprolol to tartrate from 25 mg to 12.5 mg p.o. twice daily.  She is tolerating anticoagulation without bleeding diathesis, will continue Eliquis.  Patient is otherwise stable from a cardiovascular standpoint.  Follow-up in 6 months, sooner if needed, for atrial flutter and hypertension.   Alethia Berthold, PA-C 02/18/2022, 12:40 PM Office: [phone]

## 2022-02-19 ENCOUNTER — Ambulatory Visit: Payer: Medicare PPO | Admitting: Student

## 2022-02-19 ENCOUNTER — Encounter: Payer: Self-pay | Admitting: Student

## 2022-02-19 VITALS — BP 119/71 | HR 76 | Temp 98.3°F | Resp 16 | Ht 70.0 in | Wt 202.0 lb

## 2022-02-19 DIAGNOSIS — Z9989 Dependence on other enabling machines and devices: Secondary | ICD-10-CM | POA: Diagnosis not present

## 2022-02-19 DIAGNOSIS — I10 Essential (primary) hypertension: Secondary | ICD-10-CM

## 2022-02-19 DIAGNOSIS — I484 Atypical atrial flutter: Secondary | ICD-10-CM

## 2022-02-19 DIAGNOSIS — G4733 Obstructive sleep apnea (adult) (pediatric): Secondary | ICD-10-CM | POA: Diagnosis not present

## 2022-02-27 ENCOUNTER — Ambulatory Visit: Payer: Medicare PPO | Admitting: Student

## 2022-03-03 ENCOUNTER — Encounter: Payer: Self-pay | Admitting: Physician Assistant

## 2022-03-03 ENCOUNTER — Ambulatory Visit (INDEPENDENT_AMBULATORY_CARE_PROVIDER_SITE_OTHER): Payer: Medicare PPO | Admitting: Physician Assistant

## 2022-03-03 DIAGNOSIS — L570 Actinic keratosis: Secondary | ICD-10-CM

## 2022-03-03 DIAGNOSIS — D044 Carcinoma in situ of skin of scalp and neck: Secondary | ICD-10-CM

## 2022-03-03 DIAGNOSIS — D0430 Carcinoma in situ of skin of unspecified part of face: Secondary | ICD-10-CM

## 2022-03-03 DIAGNOSIS — C44219 Basal cell carcinoma of skin of left ear and external auricular canal: Secondary | ICD-10-CM | POA: Diagnosis not present

## 2022-03-03 DIAGNOSIS — D0439 Carcinoma in situ of skin of other parts of face: Secondary | ICD-10-CM | POA: Diagnosis not present

## 2022-03-03 DIAGNOSIS — D485 Neoplasm of uncertain behavior of skin: Secondary | ICD-10-CM | POA: Diagnosis not present

## 2022-03-03 NOTE — Patient Instructions (Signed)

## 2022-03-04 ENCOUNTER — Encounter: Payer: Self-pay | Admitting: Physician Assistant

## 2022-03-04 NOTE — Progress Notes (Signed)
Follow-Up Visit   Subjective  Jeffery Underwood is a 66 y.o. male who presents for the following: Follow-up (Patient here today for 8 week follow up after using Tolak, per patient he had a good reaction. Per patient he has a few lesions her would like checked. Lesion on his nose x 2-3 months, left cheek x unsure it comes and goes, left ear x 2-3 months and forehead x just noticed. Per patient there was bleeding from his left cheek and left ear. Per patient he wears a CPAP and he believes that's what causes the lesion on the cheek. ).   The following portions of the chart were reviewed this encounter and updated as appropriate:  Tobacco  Allergies  Meds  Problems  Med Hx  Surg Hx  Fam Hx      Objective  Well appearing patient in no apparent distress; mood and affect are within normal limits.  All skin waist up examined.  left outer eye Hyperkeratotic scale with pink base        Left Zygomatic Area Erythematous patches with gritty scale.  Mid Frontal Scalp Hyperkeratotic scale with pink base        Left Mid Helix Hyperkeratotic scale with pink base        left sideburn superior Hyperkeratotic scale with pink base        left ala nasi Hyperkeratotic scale with pink base - history of mohs surgery        Assessment & Plan  Neoplasm of uncertain behavior of skin left outer eye  Skin / nail biopsy Type of biopsy: tangential   Informed consent: discussed and consent obtained   Timeout: patient name, date of birth, surgical site, and procedure verified   Anesthesia: the lesion was anesthetized in a standard fashion   Anesthetic:  1% lidocaine w/ epinephrine 1-100,000 local infiltration Instrument used: flexible razor blade   Hemostasis achieved with: aluminum chloride and electrodesiccation   Outcome: patient tolerated procedure well   Post-procedure details: wound care instructions given    Destruction of lesion Complexity: simple   Destruction  method: electrodesiccation and curettage   Informed consent: discussed and consent obtained   Timeout:  patient name, date of birth, surgical site, and procedure verified Anesthesia: the lesion was anesthetized in a standard fashion   Anesthetic:  1% lidocaine w/ epinephrine 1-100,000 local infiltration Curettage performed in three different directions: Yes   Curettage cycles:  3 Margin per side (cm):  0.1 Final wound size (cm):  0.6 Hemostasis achieved with:  aluminum chloride Outcome: patient tolerated procedure well with no complications   Post-procedure details: wound care instructions given    Specimen 1 - Surgical pathology Differential Diagnosis: bcc vs scc-txpbx  Check Margins: yes-  AK (actinic keratosis) Left Zygomatic Area  Destruction of lesion - Left Zygomatic Area Complexity: simple   Destruction method: cryotherapy   Informed consent: discussed and consent obtained   Timeout:  patient name, date of birth, surgical site, and procedure verified Lesion destroyed using liquid nitrogen: Yes   Cryotherapy cycles:  3 Outcome: patient tolerated procedure well with no complications   Post-procedure details: wound care instructions given    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  Skin / nail biopsy Type of biopsy: tangential   Informed consent: discussed and consent obtained   Timeout: patient name, date of birth, surgical site, and procedure verified   Procedure prep:  Patient was prepped and draped in usual sterile fashion (Non  sterile) Prep type:  Chlorhexidine Anesthesia: the lesion was anesthetized in a standard fashion   Anesthetic:  1% lidocaine w/ epinephrine 1-100,000 local infiltration Instrument used: flexible razor blade   Outcome: patient tolerated procedure well   Post-procedure details: wound care instructions given    Destruction of lesion Complexity: simple   Destruction method: electrodesiccation and curettage   Informed consent:  discussed and consent obtained   Timeout:  patient name, date of birth, surgical site, and procedure verified Anesthesia: the lesion was anesthetized in a standard fashion   Anesthetic:  1% lidocaine w/ epinephrine 1-100,000 local infiltration Curettage performed in three different directions: Yes   Electrodesiccation performed over the curetted area: Yes   Curettage cycles:  3 Margin per side (cm):  0.1 Final wound size (cm):  1.2 Hemostasis achieved with:  aluminum chloride Outcome: patient tolerated procedure well with no complications   Post-procedure details: wound care instructions given    Specimen 5 - Surgical pathology Differential Diagnosis: bcc vs scc-txpbx  Check Margins:yes  Basal cell carcinoma (BCC) of skin of left ear Left Mid Helix  Skin / nail biopsy Type of biopsy: tangential   Informed consent: discussed and consent obtained   Timeout: patient name, date of birth, surgical site, and procedure verified   Procedure prep:  Patient was prepped and draped in usual sterile fashion (Non sterile) Prep type:  Chlorhexidine Anesthesia: the lesion was anesthetized in a standard fashion   Anesthetic:  1% lidocaine w/ epinephrine 1-100,000 local infiltration Instrument used: flexible razor blade   Outcome: patient tolerated procedure well   Post-procedure details: wound care instructions given    Specimen 3 - Surgical pathology Differential Diagnosis: bcc vs scc- no TREATMENT  Check Margins: yes  Carcinoma in situ of skin of face, unspecified location (2) left sideburn superior  Skin / nail biopsy Type of biopsy: tangential   Informed consent: discussed and consent obtained   Timeout: patient name, date of birth, surgical site, and procedure verified   Procedure prep:  Patient was prepped and draped in usual sterile fashion (Non sterile) Prep type:  Chlorhexidine Anesthesia: the lesion was anesthetized in a standard fashion   Anesthetic:  1% lidocaine w/  epinephrine 1-100,000 local infiltration Instrument used: flexible razor blade   Outcome: patient tolerated procedure well   Post-procedure details: wound care instructions given    Destruction of lesion Complexity: simple   Destruction method: electrodesiccation and curettage   Informed consent: discussed and consent obtained   Timeout:  patient name, date of birth, surgical site, and procedure verified Anesthesia: the lesion was anesthetized in a standard fashion   Anesthetic:  1% lidocaine w/ epinephrine 1-100,000 local infiltration Curettage performed in three different directions: Yes   Electrodesiccation performed over the curetted area: Yes   Curettage cycles:  3 Margin per side (cm):  0.1 Final wound size (cm):  1.4 Hemostasis achieved with:  aluminum chloride Outcome: patient tolerated procedure well with no complications   Post-procedure details: wound care instructions given    Specimen 2 - Surgical pathology Differential Diagnosis: bcc vs scc-txpbx  Check Margins: yes  left ala nasi  Skin / nail biopsy Type of biopsy: tangential   Informed consent: discussed and consent obtained   Timeout: patient name, date of birth, surgical site, and procedure verified   Procedure prep:  Patient was prepped and draped in usual sterile fashion (Non sterile) Prep type:  Chlorhexidine Anesthesia: the lesion was anesthetized in a standard fashion   Anesthetic:  1%  lidocaine w/ epinephrine 1-100,000 local infiltration Instrument used: flexible razor blade   Outcome: patient tolerated procedure well   Post-procedure details: wound care instructions given    Specimen 4 - Surgical pathology Differential Diagnosis: bcc vs scc- NO treatment [id number] Check Margins: yes    I, Orissa Arreaga, PA-C, have reviewed all documentation's for this visit.  The documentation on 03/09/22 for the exam, diagnosis, procedures and orders are all accurate and complete.

## 2022-03-09 ENCOUNTER — Telehealth: Payer: Self-pay | Admitting: *Deleted

## 2022-03-09 NOTE — Telephone Encounter (Signed)
-----   Message from Warren Danes, [place] sent at 03/09/2022  2:08 PM EDT ----- 3. Mohs 4. mohs

## 2022-03-09 NOTE — Telephone Encounter (Signed)
Pathology to patient- referral sent to skin surgery center for MOHS 

## 2022-04-13 DIAGNOSIS — J45901 Unspecified asthma with (acute) exacerbation: Secondary | ICD-10-CM | POA: Diagnosis not present

## 2022-04-13 DIAGNOSIS — J40 Bronchitis, not specified as acute or chronic: Secondary | ICD-10-CM | POA: Diagnosis not present

## 2022-04-16 DIAGNOSIS — C44219 Basal cell carcinoma of skin of left ear and external auricular canal: Secondary | ICD-10-CM | POA: Diagnosis not present

## 2022-04-16 DIAGNOSIS — D0439 Carcinoma in situ of skin of other parts of face: Secondary | ICD-10-CM | POA: Diagnosis not present

## 2022-04-17 ENCOUNTER — Encounter: Payer: Self-pay | Admitting: Physician Assistant

## 2022-04-23 DIAGNOSIS — R052 Subacute cough: Secondary | ICD-10-CM | POA: Diagnosis not present

## 2022-04-24 ENCOUNTER — Other Ambulatory Visit: Payer: Self-pay | Admitting: Family Medicine

## 2022-04-24 ENCOUNTER — Ambulatory Visit
Admission: RE | Admit: 2022-04-24 | Discharge: 2022-04-24 | Disposition: A | Discharge status: Home / Self Care | Payer: Medicare PPO | Source: Ambulatory Visit | Attending: Family Medicine | Admitting: Family Medicine

## 2022-04-24 DIAGNOSIS — R059 Cough, unspecified: Secondary | ICD-10-CM | POA: Diagnosis not present

## 2022-04-24 DIAGNOSIS — R052 Subacute cough: Secondary | ICD-10-CM

## 2022-06-05 ENCOUNTER — Ambulatory Visit
Admission: RE | Admit: 2022-06-05 | Discharge: 2022-06-05 | Disposition: A | Discharge status: Home / Self Care | Payer: Medicare PPO | Source: Ambulatory Visit | Attending: Family Medicine | Admitting: Family Medicine

## 2022-06-05 ENCOUNTER — Other Ambulatory Visit: Payer: Self-pay | Admitting: Family Medicine

## 2022-06-05 DIAGNOSIS — Z20822 Contact with and (suspected) exposure to covid-19: Secondary | ICD-10-CM | POA: Diagnosis not present

## 2022-06-05 DIAGNOSIS — R051 Acute cough: Secondary | ICD-10-CM

## 2022-06-05 DIAGNOSIS — R059 Cough, unspecified: Secondary | ICD-10-CM | POA: Diagnosis not present

## 2022-06-05 DIAGNOSIS — R6883 Chills (without fever): Secondary | ICD-10-CM | POA: Diagnosis not present

## 2022-06-09 ENCOUNTER — Ambulatory Visit: Payer: Medicare PPO | Admitting: Physician Assistant

## 2022-06-21 ENCOUNTER — Other Ambulatory Visit: Payer: Self-pay

## 2022-06-21 ENCOUNTER — Ambulatory Visit: Admission: EM | Admit: 2022-06-21 | Discharge: 2022-06-21 | Payer: Medicare PPO

## 2022-06-21 ENCOUNTER — Emergency Department (HOSPITAL_COMMUNITY): Payer: Medicare PPO

## 2022-06-21 ENCOUNTER — Encounter: Payer: Self-pay | Admitting: Emergency Medicine

## 2022-06-21 ENCOUNTER — Emergency Department (HOSPITAL_COMMUNITY)
Admission: EM | Admit: 2022-06-21 | Discharge: 2022-06-21 | Disposition: A | Discharge status: Home / Self Care | Payer: Medicare PPO | Attending: Emergency Medicine | Admitting: Emergency Medicine

## 2022-06-21 DIAGNOSIS — R0902 Hypoxemia: Secondary | ICD-10-CM

## 2022-06-21 DIAGNOSIS — R61 Generalized hyperhidrosis: Secondary | ICD-10-CM | POA: Diagnosis not present

## 2022-06-21 DIAGNOSIS — Z7901 Long term (current) use of anticoagulants: Secondary | ICD-10-CM | POA: Insufficient documentation

## 2022-06-21 DIAGNOSIS — J45909 Unspecified asthma, uncomplicated: Secondary | ICD-10-CM | POA: Diagnosis not present

## 2022-06-21 DIAGNOSIS — D72829 Elevated white blood cell count, unspecified: Secondary | ICD-10-CM | POA: Diagnosis not present

## 2022-06-21 DIAGNOSIS — R531 Weakness: Secondary | ICD-10-CM | POA: Diagnosis not present

## 2022-06-21 DIAGNOSIS — J189 Pneumonia, unspecified organism: Secondary | ICD-10-CM

## 2022-06-21 DIAGNOSIS — Z20822 Contact with and (suspected) exposure to covid-19: Secondary | ICD-10-CM | POA: Diagnosis not present

## 2022-06-21 DIAGNOSIS — R55 Syncope and collapse: Secondary | ICD-10-CM

## 2022-06-21 DIAGNOSIS — I959 Hypotension, unspecified: Secondary | ICD-10-CM | POA: Diagnosis not present

## 2022-06-21 DIAGNOSIS — R110 Nausea: Secondary | ICD-10-CM | POA: Diagnosis not present

## 2022-06-21 DIAGNOSIS — R053 Chronic cough: Secondary | ICD-10-CM | POA: Diagnosis not present

## 2022-06-21 DIAGNOSIS — R42 Dizziness and giddiness: Secondary | ICD-10-CM | POA: Diagnosis not present

## 2022-06-21 DIAGNOSIS — Z79899 Other long term (current) drug therapy: Secondary | ICD-10-CM | POA: Diagnosis not present

## 2022-06-21 DIAGNOSIS — R059 Cough, unspecified: Secondary | ICD-10-CM | POA: Diagnosis not present

## 2022-06-21 LAB — CBC WITH DIFFERENTIAL/PLATELET
Abs Immature Granulocytes: 0.05 10*3/uL (ref 0.00–0.07)
Basophils Absolute: 0 10*3/uL (ref 0.0–0.1)
Basophils Relative: 0 %
Eosinophils Absolute: 0 10*3/uL (ref 0.0–0.5)
Eosinophils Relative: 0 %
HCT: 38.7 % — ABNORMAL LOW (ref 39.0–52.0)
Hemoglobin: 13.4 g/dL (ref 13.0–17.0)
Immature Granulocytes: 0 %
Lymphocytes Relative: 5 %
Lymphs Abs: 0.7 10*3/uL (ref 0.7–4.0)
MCH: 31.8 pg (ref 26.0–34.0)
MCHC: 34.6 g/dL (ref 30.0–36.0)
MCV: 91.9 fL (ref 80.0–100.0)
Monocytes Absolute: 1.1 10*3/uL — ABNORMAL HIGH (ref 0.1–1.0)
Monocytes Relative: 9 %
Neutro Abs: 11.4 10*3/uL — ABNORMAL HIGH (ref 1.7–7.7)
Neutrophils Relative %: 86 %
Platelets: 206 10*3/uL (ref 150–400)
RBC: 4.21 MIL/uL — ABNORMAL LOW (ref 4.22–5.81)
RDW: 12.9 % (ref 11.5–15.5)
WBC: 13.3 10*3/uL — ABNORMAL HIGH (ref 4.0–10.5)
nRBC: 0 % (ref 0.0–0.2)

## 2022-06-21 LAB — TROPONIN I (HIGH SENSITIVITY)
Troponin I (High Sensitivity): 4 ng/L (ref <18)
Troponin I (High Sensitivity): 4 ng/L (ref <18)

## 2022-06-21 LAB — COMPREHENSIVE METABOLIC PANEL
ALT: 36 U/L (ref 0–44)
AST: 27 U/L (ref 15–41)
Albumin: 3.8 g/dL (ref 3.5–5.0)
Alkaline Phosphatase: 50 U/L (ref 38–126)
Anion gap: 7 (ref 5–15)
BUN: 17 mg/dL (ref 8–23)
CO2: 26 mmol/L (ref 22–32)
Calcium: 9.2 mg/dL (ref 8.9–10.3)
Chloride: 107 mmol/L (ref 98–111)
Creatinine, Ser: 1.18 mg/dL (ref 0.61–1.24)
GFR, Estimated: >60 mL/min (ref >60)
Glucose, Bld: 128 mg/dL — ABNORMAL HIGH (ref 70–99)
Potassium: 4.2 mmol/L (ref 3.5–5.1)
Sodium: 140 mmol/L (ref 135–145)
Total Bilirubin: 0.8 mg/dL (ref 0.3–1.2)
Total Protein: 6.8 g/dL (ref 6.5–8.1)

## 2022-06-21 LAB — LACTIC ACID, PLASMA
Lactic Acid, Venous: 1.4 mmol/L (ref 0.5–1.9)
Lactic Acid, Venous: 1.3 mmol/L (ref 0.5–1.9)

## 2022-06-21 LAB — SARS CORONAVIRUS 2 BY RT PCR: SARS Coronavirus 2 by RT PCR: NEGATIVE

## 2022-06-21 LAB — D-DIMER, QUANTITATIVE: D-Dimer, Quant: 0.38 ug/mL-FEU (ref 0.00–0.50)

## 2022-06-21 MED ORDER — DOXYCYCLINE HYCLATE 100 MG PO CAPS: 100.0000 mg | ORAL_CAPSULE | Freq: Two times a day (BID) | ORAL | 0 refills | Status: DC

## 2022-06-21 MED ORDER — SODIUM CHLORIDE 0.9 % IV SOLN
500.0000 mg | Freq: Once | INTRAVENOUS | Status: AC
  Administered 2022-06-21: 500 mg via INTRAVENOUS
  Filled 2022-06-21: qty 5

## 2022-06-21 MED ORDER — SODIUM CHLORIDE 0.9 % IV SOLN
1.0000 g | Freq: Once | INTRAVENOUS | Status: AC
  Administered 2022-06-21: 1 g via INTRAVENOUS
  Filled 2022-06-21: qty 10

## 2022-06-21 MED ORDER — SODIUM CHLORIDE 0.9 % IV BOLUS
1000.0000 mL | Freq: Once | INTRAVENOUS | Status: AC
  Administered 2022-06-21: 1000 mL via INTRAVENOUS

## 2022-06-21 MED ORDER — AMOXICILLIN 500 MG PO CAPS: 500.0000 mg | ORAL_CAPSULE | Freq: Three times a day (TID) | ORAL | 0 refills | Status: DC

## 2022-06-21 NOTE — ED Notes (Signed)
Ambulated with pulse ox, maintained sats around 94%

## 2022-06-21 NOTE — ED Provider Notes (Signed)
[facility]  Note:  This document was prepared using Systems analyst and may include unintentional dictation errors.  MRN: [id number] DOB: 1956-09-01  Subjective:   Jeffery Underwood is a 66 y.o. male presenting for 1-2 month history of persistent progressively worsening fatigue, coughing, shortness of breath.  Patient has been treated with multiple rounds of antibiotic for a respiratory infection.  Has had x-rays done that were negative.  Upon presentation to our clinic as he was being registered, patient started to feel woozy, nauseous and weak.  Patient stepped outside and ended up setting himself to the ground.  He is not really sure if he lost consciousness but did scrape his elbow on the way down.  Patient has a history of atrial fibrillation and was last cardioverted in the November 2022 as per chart review.  He also has a history of asthma but no other respiratory disorders.  No current facility-administered medications for this encounter.  Current Outpatient Medications:    acetaminophen (TYLENOL) 325 MG tablet, Take 650 mg by mouth every 6 (six) hours as needed., Disp: , Rfl:    albuterol (PROVENTIL HFA;VENTOLIN HFA) 108 (90 BASE) MCG/ACT inhaler, Inhale 2 puffs into the lungs as needed for wheezing or shortness of breath., Disp: , Rfl:    apixaban (ELIQUIS) 5 MG TABS tablet, Take 1 tablet (5 mg total) by mouth 2 (two) times daily., Disp: 60 tablet, Rfl: 0   azelastine (OPTIVAR) 0.05 % ophthalmic solution, Apply 1 drop to eye in the morning and at bedtime., Disp: , Rfl:    b complex vitamins capsule, Take 1 capsule by mouth daily., Disp: , Rfl:    budesonide-formoterol (SYMBICORT) 160-4.5 MCG/ACT inhaler, Inhale 2 puffs into the lungs in the morning and at bedtime., Disp: , Rfl:    buPROPion (WELLBUTRIN XL) 300 MG 24 hr tablet, Take 300 mg by mouth in the morning., Disp: , Rfl:    cetirizine (ZYRTEC) 10 MG tablet, Take 10 mg by mouth every evening.,  Disp: , Rfl:    diltiazem (CARDIZEM CD) 180 MG 24 hr capsule, TAKE 1 CAPSULE EVERY DAY, Disp: 90 capsule, Rfl: 0   fluticasone (FLONASE) 50 MCG/ACT nasal spray, Place 1 spray into both nostrils 2 (two) times daily., Disp: , Rfl: 1   lisinopril (ZESTRIL) 5 MG tablet, Take 5 mg by mouth every evening., Disp: , Rfl:    metoprolol tartrate (LOPRESSOR) 25 MG tablet, Take 0.5 tablets (12.5 mg total) by mouth 2 (two) times daily., Disp: 90 tablet, Rfl: 3   Multiple Vitamins-Minerals (MULTIVITAMIN PO), Take 1 tablet by mouth at bedtime., Disp: , Rfl:    Respiratory Therapy Supplies (CARETOUCH 2 CPAP HOSE HANGER) MISC, See admin instructions., Disp: , Rfl:    vitamin C (ASCORBIC ACID) 500 MG tablet, Take 500 mg by mouth daily as needed (immune health support)., Disp: , Rfl:    vitamin E 1000 UNIT capsule, Take 1,000 Units by mouth at bedtime., Disp: , Rfl:    No Known Allergies  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   Right Anterior Neck   OSA (obstructive sleep apnea)    Prediabetes 09/21/2004   resolved 10/2011   Psoriasis    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   Right Sideburn (well diff)   SCCA (squamous  cell carcinoma) of skin 03/19/2021   Left Sideburn (in situ)   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   Left [place] (well diff)   Seasonal allergies      Past Surgical History:  Procedure Laterality Date   CARDIOVERSION N/A 08/05/2021   Procedure: CARDIOVERSION;  Surgeon: Adrian Prows, MD;  Location: [facility] ENDOSCOPY;  Service: Cardiovascular;  Laterality: N/A;   INGUINAL HERNIA REPAIR     right side (4-5 years ago)    Family History  Problem Relation Age of Onset   Depression Mother    Tuberculosis Mother    Osteoporosis Mother    Diabetes type II Father    Atrial fibrillation Father      Social History   Tobacco Use   Smoking status: Never   Smokeless tobacco: Never  Vaping Use   Vaping Use: Never used  Substance Use Topics   Alcohol use: Yes    Alcohol/week: 14.0 standard drinks of alcohol    Types: 14 Cans of beer per week    Comment: 3-5 per week   Drug use: No    ROS   Objective:   Vitals: BP 111/71 (BP Location: Left Arm)   Pulse 73   Temp 99.4 F (37.4 C) (Oral)   Resp 18   SpO2 (!) 89%   Physical Exam Constitutional:      General: He is not in acute distress.    Appearance: Normal appearance. He is well-developed. He is ill-appearing and diaphoretic. He is not toxic-appearing.  HENT:     Head: Normocephalic and atraumatic.     Right Ear: External ear normal.     Left Ear: External ear normal.     Nose: Nose normal.     Mouth/Throat:     Mouth: Mucous membranes are moist.  Eyes:     General: No scleral icterus.       Right eye: No discharge.        Left eye: No discharge.     Extraocular Movements: Extraocular movements intact.  Cardiovascular:     Rate and Rhythm: Normal rate and regular rhythm.     Heart sounds: Normal heart sounds. No murmur heard.    No friction rub. No gallop.  Pulmonary:     Effort: Pulmonary effort is normal. No respiratory distress.     Breath sounds: Normal breath sounds. No stridor. No wheezing, rhonchi or rales.  Skin:    Coloration: Skin is pale.  Neurological:     Mental Status: He is alert and oriented to person, place, and time.  Psychiatric:        Mood and Affect: Mood normal.        Behavior: Behavior normal.        Thought Content: Thought content normal.     ED ECG REPORT   Date: 06/21/2022  EKG Time: 2:20 PM  Rate: 74bpm  Rhythm: normal sinus rhythm,  normal EKG, normal sinus rhythm  Axis: normal  Intervals:first-degree A-V block   ST&T Change: non-specific t-wave flattening aV  Narrative Interpretation: Sinus rhythm at 74 bpm with first-degree AV block and nonspecific T wave  flattening in aVL.  Comparable to previous EKGs.   Assessment and Plan :   PDMP not reviewed this encounter.  1. Near syncope   2. Diaphoresis   3. Persistent cough for 3 weeks or longer   4. Hypoxia     Patient suffered near syncope and collapse outside of our clinic.  He was brought  in, EKG performed to rule out recurrent atrial fibrillation, acute cardiac event.  On exam he was very diaphoretic, nauseous and dry heaving.  His HPI is concerning for an ongoing infectious process, hypoxia, sepsis, respiratory failure, developing acute cardiopulmonary event.  This requires a higher level of care than we can provide in the urgent care setting.  EMS was called, case reported out, transfer of care completed.    Jaynee Eagles, PA-C 06/21/22 1428

## 2022-06-21 NOTE — Discharge Instructions (Addendum)
Keep yourself hydrated.  Take the antibiotics as prescribed for pneumonia.  Follow-up with your doctor as scheduled this week.  You declined admission to the hospital today for further evaluation of your syncope.  You declined CT scan of your head.  You should return to the ED if you develop a severe headache or mental status change confusion or vomiting.  There is no evidence of heart attack or blood clot in the lung. COVID test is pending you may check your results online later today or tomorrow. Return to the ED sooner with difficulty breathing, chest pain, recurrent episodes of passing out, not able to eat or drink or any other concerns.

## 2022-06-21 NOTE — ED Provider Notes (Signed)
[facility] DEPT Provider Note   CSN: [id number] Arrival date & time: 06/21/22  1457     History  Chief Complaint  Patient presents with   Near Syncope    Jeffery Underwood is a 66 y.o. male.  Patient with a history of asthma, sleep apnea, atrial fibrillation on Eliquis presenting with cough, congestion, near syncope from urgent care.  States has been feeling ill for the past 2 months with respiratory illnesses including cough, congestion and sore throat.  Has been treated with antibiotics at least twice most recently 3 weeks ago.  Did not receive a clear diagnosis.  He was going to urgent care this morning because he was feeling poorly with cough, congestion, chills and fever over 100 last night.  While his wife was checking him in patient had a near syncopal episode in the parking lot falling and injuring his left elbow.  Does not think he hit his head.  He remembers feeling dizzy, lightheaded, sweaty and nauseous.  He fell to his knees but did not hit his head but did scrape his left elbow.  Does not think he lost consciousness completely.  Still complaining of feeling dizzy and lightheaded and nauseous.  No recent vomiting or nausea.  No abdominal pain.  No diarrhea.  No leg pain or leg swelling.  At urgent care he was hypoxic in the 80s but is saturating well now on room air.  No recent wheezing.  The history is provided by the patient and the EMS personnel.  Near Syncope Associated symptoms include headaches and shortness of breath. Pertinent negatives include no chest pain.       Home Medications Prior to Admission medications   Medication Sig Start Date End Date Taking? Authorizing Provider  acetaminophen (TYLENOL) 325 MG tablet Take 650 mg by mouth every 6 (six) hours as needed.    [provider]  albuterol (PROVENTIL HFA;VENTOLIN HFA) 108 (90 BASE) MCG/ACT inhaler Inhale 2 puffs into the lungs as needed for wheezing or shortness of breath.     [provider]  apixaban (ELIQUIS) 5 MG TABS tablet Take 1 tablet (5 mg total) by mouth 2 (two) times daily. 07/18/21   Cantwell, Celeste C, PA-C  azelastine (OPTIVAR) 0.05 % ophthalmic solution Apply 1 drop to eye in the morning and at bedtime. 06/19/21   [provider]  b complex vitamins capsule Take 1 capsule by mouth daily.    [provider]  budesonide-formoterol (SYMBICORT) 160-4.5 MCG/ACT inhaler Inhale 2 puffs into the lungs in the morning and at bedtime.    [provider]  buPROPion (WELLBUTRIN XL) 300 MG 24 hr tablet Take 300 mg by mouth in the morning. 07/29/21   [provider]  cetirizine (ZYRTEC) 10 MG tablet Take 10 mg by mouth every evening.    [provider]  diltiazem (CARDIZEM CD) 180 MG 24 hr capsule TAKE 1 CAPSULE EVERY DAY 01/19/22   Cantwell, Celeste C, PA-C  fluticasone (FLONASE) 50 MCG/ACT nasal spray Place 1 spray into both nostrils 2 (two) times daily. 11/18/17   [provider]  lisinopril (ZESTRIL) 5 MG tablet Take 5 mg by mouth every evening.    [provider]  metoprolol tartrate (LOPRESSOR) 25 MG tablet Take 0.5 tablets (12.5 mg total) by mouth 2 (two) times daily. 08/29/21   Cantwell, Celeste C, PA-C  Multiple Vitamins-Minerals (MULTIVITAMIN PO) Take 1 tablet by mouth at bedtime.    [provider]  Respiratory Therapy Supplies (  CARETOUCH 2 CPAP HOSE HANGER) MISC See admin instructions.    [provider]  vitamin C (ASCORBIC ACID) 500 MG tablet Take 500 mg by mouth daily as needed (immune health support).    [provider]  vitamin E 1000 UNIT capsule Take 1,000 Units by mouth at bedtime.    [provider]      Allergies    Patient has no known allergies.    Review of Systems   Review of Systems  Constitutional:  Negative for activity change, appetite change and fever.  Respiratory:  Positive for cough and shortness of breath.   Cardiovascular:   Positive for near-syncope. Negative for chest pain.  Gastrointestinal:  Negative for nausea and vomiting.  Genitourinary:  Negative for dysuria and hematuria.  Musculoskeletal:  Positive for arthralgias and myalgias.  Skin:  Negative for rash.  Neurological:  Positive for dizziness, syncope, weakness, light-headedness and headaches.   all other systems are negative except as noted in the HPI and PMH.    Physical Exam Updated Vital Signs BP 136/72   Pulse 80   Temp 98 F (36.7 C) (Oral)   Resp (!) 23   SpO2 95%  Physical Exam Vitals and nursing note reviewed.  Constitutional:      General: He is not in acute distress.    Appearance: He is well-developed.     Comments: Fatigued appearing  HENT:     Head: Normocephalic and atraumatic.     Mouth/Throat:     Pharynx: No oropharyngeal exudate.  Eyes:     Conjunctiva/sclera: Conjunctivae normal.     Pupils: Pupils are equal, round, and reactive to light.  Neck:     Comments: No meningismus. Cardiovascular:     Rate and Rhythm: Normal rate and regular rhythm.     Heart sounds: Normal heart sounds. No murmur heard. Pulmonary:     Effort: Pulmonary effort is normal. No respiratory distress.     Breath sounds: No rales.     Comments: Crackles right base Abdominal:     Palpations: Abdomen is soft.     Tenderness: There is no abdominal tenderness. There is no guarding or rebound.  Musculoskeletal:        General: No tenderness. Normal range of motion.     Cervical back: Normal range of motion and neck supple.     Comments: Skin tear left elbow without bony tenderness  Skin:    General: Skin is warm.  Neurological:     Mental Status: He is alert and oriented to person, place, and time.     Cranial Nerves: No cranial nerve deficit.     Motor: No abnormal muscle tone.     Coordination: Coordination normal.     Comments:  5/5 strength throughout. CN 2-12 intact.Equal grip strength.   Psychiatric:        Behavior: Behavior  normal.     ED Results / Procedures / Treatments   Labs (all labs ordered are listed, but only abnormal results are displayed) Labs Reviewed  CBC WITH DIFFERENTIAL/PLATELET - Abnormal; Notable for the following components:      Result Value   WBC 13.3 (*)    RBC 4.21 (*)    HCT 38.7 (*)    Neutro Abs 11.4 (*)    Monocytes Absolute 1.1 (*)    All other components within normal limits  COMPREHENSIVE METABOLIC PANEL - Abnormal; Notable for the following components:   Glucose, Bld 128 (*)    All  other components within normal limits  SARS CORONAVIRUS 2 BY RT PCR  CULTURE, BLOOD (ROUTINE X 2)  CULTURE, BLOOD (ROUTINE X 2)  D-DIMER, QUANTITATIVE  LACTIC ACID, PLASMA  LACTIC ACID, PLASMA  BRAIN NATRIURETIC PEPTIDE  BRAIN NATRIURETIC PEPTIDE  TROPONIN I (HIGH SENSITIVITY)  TROPONIN I (HIGH SENSITIVITY)    EKG EKG Interpretation  Date/Time:  Sunday June 21 2022 16:02:13 EDT Ventricular Rate:  74 PR Interval:  251 QRS Duration: 98 QT Interval:  381 QTC Calculation: 423 R Axis:   -12 Text Interpretation: Sinus rhythm Prolonged PR interval Abnormal R-wave progression, early transition No significant change was found Confirmed by Ezequiel Essex [phone]) on 06/21/2022 4:16:42 PM  Radiology DG Chest 2 View  Result Date: 06/21/2022 CLINICAL DATA:  cough, syncope EXAM: CHEST - 2 VIEW COMPARISON:  June 05, 2022, March 27, 2012 FINDINGS: The cardiomediastinal silhouette is unchanged in contour. No pleural effusion. No pneumothorax. Increased RIGHT lower lobe opacity. Visualized abdomen is unremarkable. Minimal degenerative changes of the thoracic spine. IMPRESSION: RIGHT lower lobe opacity.  This likely reflects infection. Followup PA and lateral chest X-ray is recommended in 3-4 weeks following trial of antibiotic therapy to ensure resolution and exclude underlying malignancy. Electronically Signed   By: Valentino Saxon M.D.   On: 06/21/2022 16:05    Procedures Procedures     Medications Ordered in ED Medications  sodium chloride 0.9 % bolus 1,000 mL (has no administration in time range)    ED Course/ Medical Decision Making/ A&P                           Medical Decision Making Amount and/or Complexity of Data Reviewed Independent Historian: EMS Labs: ordered. Decision-making details documented in ED Course. Radiology: ordered and independent interpretation performed. Decision-making details documented in ED Course. ECG/medicine tests: ordered and independent interpretation performed. Decision-making details documented in ED Course.  Risk Prescription drug management.   Respiratory illness for several months now with syncopal episode at urgent care.  Vitals are stable on arrival.  No hypoxia or increased work of breathing.  Lungs are clear.  We will hydrate, check orthostatics, chest x-ray, EKG and labs.  EKG is sinus rhythm.  No prolonged QT, no Brugada.  No atrial fibrillation.  Chest x-ray concerning for right basilar pneumonia again. Orthostatics are negative.   Labs show leukocytosis of 13.  Lactate is normal.  D-dimer is negative with low suspicion for pulmonary embolism.  Patient able to ambulate without desaturation.  He feels improved.  Denies dizziness or lightheadedness or shortness of breath.  Troponin negative x2 with low suspicion for ACS.  Admission versus discharge discussed with the patient and wife.  He would like to go home.  This seems reasonable.  He had an echocardiogram 1 year ago that showed normal ejection fraction without any valve abnormalities.  Low suspicion for cardiogenic cause of his syncope.  Will initiate antibiotic course again for recurrent pneumonia.  Blood cultures are pending.  Considered CT head given patient's near syncope with anticoagulation use.  He denies headache and he denies any head trauma.  He declined CT head.  He is anxious to go home.  He is tolerating p.o. and ambulatory.  He is able to  ambulate without desaturation.  Suspect likely vasovagal syncope in setting of respiratory illness.  No evidence of ACS or PE.  Continue hydration at home, antibiotics, PCP follow-up.  Return to the ED sooner with new or worsening symptoms.  Final Clinical Impression(s) / ED Diagnoses Final diagnoses:  Community acquired pneumonia of right lower lobe of lung  Near syncope    Rx / DC Orders ED Discharge Orders     None         Amaani Guilbault, Annie Main, MD 06/22/22 0001

## 2022-06-21 NOTE — ED Triage Notes (Addendum)
Experiencing symptoms x 2 months. Productive cough, fever. Felt worse today. While checking in, began to feel weak, nauseous, went outside and slid to the ground per patient, scraping his elbow. He's not sure whether or not he lost consciousness, wife says he got very pale. On assessment, patient does appear pale and diaphoretic. Able to stand and get self into wheelchair. Hx of asthma, no other lung issues to his knowledge. Hx of a cardioversion last fall for a. Fib.

## 2022-06-21 NOTE — ED Triage Notes (Signed)
Pt BIBA from UC. Pt has had respiratory infection for 2+ months. Multiple rounds of antibiotics. While at [facility] pt suddenly felt diaphoretic and hot. Pt unsure if they lost consciousness. No fall.  Aox4  Hx afib  BP: 108/58 HR: 86 SPO2: 95 RA CBG: 150

## 2022-06-21 NOTE — ED Notes (Signed)
Provider notified that pt would like to be discharged.

## 2022-06-26 DIAGNOSIS — I10 Essential (primary) hypertension: Secondary | ICD-10-CM | POA: Diagnosis not present

## 2022-06-26 DIAGNOSIS — R053 Chronic cough: Secondary | ICD-10-CM | POA: Diagnosis not present

## 2022-06-26 DIAGNOSIS — G4733 Obstructive sleep apnea (adult) (pediatric): Secondary | ICD-10-CM | POA: Diagnosis not present

## 2022-06-26 DIAGNOSIS — Z23 Encounter for immunization: Secondary | ICD-10-CM | POA: Diagnosis not present

## 2022-06-26 DIAGNOSIS — Z79899 Other long term (current) drug therapy: Secondary | ICD-10-CM | POA: Diagnosis not present

## 2022-06-26 DIAGNOSIS — F339 Major depressive disorder, recurrent, unspecified: Secondary | ICD-10-CM | POA: Diagnosis not present

## 2022-06-26 DIAGNOSIS — Z0000 Encounter for general adult medical examination without abnormal findings: Secondary | ICD-10-CM | POA: Diagnosis not present

## 2022-06-26 DIAGNOSIS — G4750 Parasomnia, unspecified: Secondary | ICD-10-CM | POA: Diagnosis not present

## 2022-06-26 DIAGNOSIS — R7303 Prediabetes: Secondary | ICD-10-CM | POA: Diagnosis not present

## 2022-06-26 LAB — CULTURE, BLOOD (ROUTINE X 2): Culture: NO GROWTH

## 2022-07-15 DIAGNOSIS — Z09 Encounter for follow-up examination after completed treatment for conditions other than malignant neoplasm: Secondary | ICD-10-CM | POA: Diagnosis not present

## 2022-07-15 DIAGNOSIS — L570 Actinic keratosis: Secondary | ICD-10-CM | POA: Diagnosis not present

## 2022-07-15 DIAGNOSIS — Z872 Personal history of diseases of the skin and subcutaneous tissue: Secondary | ICD-10-CM | POA: Diagnosis not present

## 2022-07-15 DIAGNOSIS — L578 Other skin changes due to chronic exposure to nonionizing radiation: Secondary | ICD-10-CM | POA: Diagnosis not present

## 2022-07-16 DIAGNOSIS — G4733 Obstructive sleep apnea (adult) (pediatric): Secondary | ICD-10-CM | POA: Diagnosis not present

## 2022-07-23 ENCOUNTER — Ambulatory Visit (INDEPENDENT_AMBULATORY_CARE_PROVIDER_SITE_OTHER): Payer: Medicare PPO | Admitting: Internal Medicine

## 2022-07-23 ENCOUNTER — Encounter: Payer: Self-pay | Admitting: Internal Medicine

## 2022-07-23 ENCOUNTER — Ambulatory Visit (INDEPENDENT_AMBULATORY_CARE_PROVIDER_SITE_OTHER): Payer: Medicare PPO

## 2022-07-23 VITALS — BP 116/64 | HR 66 | Temp 98.2°F | Ht 70.0 in | Wt 203.4 lb

## 2022-07-23 DIAGNOSIS — R0602 Shortness of breath: Secondary | ICD-10-CM | POA: Diagnosis not present

## 2022-07-23 DIAGNOSIS — J4540 Moderate persistent asthma, uncomplicated: Secondary | ICD-10-CM | POA: Diagnosis not present

## 2022-07-23 DIAGNOSIS — J189 Pneumonia, unspecified organism: Secondary | ICD-10-CM | POA: Diagnosis not present

## 2022-07-23 DIAGNOSIS — J309 Allergic rhinitis, unspecified: Secondary | ICD-10-CM

## 2022-07-23 LAB — POCT EXHALED NITRIC OXIDE: FeNO level (ppb): 23

## 2022-07-23 MED ORDER — ALBUTEROL SULFATE HFA 108 (90 BASE) MCG/ACT IN AERS: 2.0000 | INHALATION_SPRAY | RESPIRATORY_TRACT | 5 refills | Status: DC | PRN

## 2022-07-23 MED ORDER — FLUTICASONE-SALMETEROL 230-21 MCG/ACT IN AERO: 2.0000 | INHALATION_SPRAY | Freq: Two times a day (BID) | RESPIRATORY_TRACT | 12 refills | Status: DC

## 2022-07-23 NOTE — Patient Instructions (Addendum)
Please schedule follow up scheduled with APP in 2 months.  If my schedule is not open yet, we will contact you with a reminder closer to that time. Please call [phone] if you haven't heard from [place] a month before.   Stop symbicort inhaler. Switch to advair 2 puffs twice a day.  Continue albuterol inhaler as needed.  I am referring you to allergy for your persistent allergy symptoms which may be contributing to your asthma.  For now continue flonase, zyrtec and astelin eye drops.    By learning about asthma and how it can be controlled, you take an important step toward managing this disease. Work closely with your asthma care team to learn all you can about your asthma, how to avoid triggers, what your medications do, and how to take them correctly. With proper care, you can live free of asthma symptoms and maintain a normal, healthy lifestyle.   What is asthma? Asthma is a chronic disease that affects the airways of the lungs. During normal breathing, the bands of muscle that surround the airways are relaxed and air moves freely. During an asthma episode or "attack," there are three main changes that stop air from moving easily through the airways: The bands of muscle that surround the airways tighten and make the airways narrow. This tightening is called bronchospasm.  The lining of the airways becomes swollen or inflamed.  The cells that line the airways produce more mucus, which is thicker than normal and clogs the airways.  These three factors - bronchospasm, inflammation, and mucus production - cause symptoms such as difficulty breathing, wheezing, and coughing.  What are the most common symptoms of asthma? Asthma symptoms are not the same for everyone. They can even change from episode to episode in the same person. Also, you may have only one symptom of asthma, such as cough, but another person may have all the symptoms of asthma. It is important to know all the symptoms of asthma  and to be aware that your asthma can present in any of these ways at any time. The most common symptoms include: Coughing, especially at night  Shortness of breath  Wheezing  Chest tightness, pain, or pressure   Who is affected by asthma? Asthma affects 22 million Americans; about 6 million of these are children under age 22. People who have a family history of asthma have an increased risk of developing the disease. Asthma is also more common in people who have allergies or who are exposed to tobacco smoke. However, anyone can develop asthma at any time. Some people may have asthma all of their lives, while others may develop it as adults.  What causes asthma? The airways in a person with asthma are very sensitive and react to many things, or "triggers." Contact with these triggers causes asthma symptoms. One of the most important parts of asthma control is to identify your triggers and then avoid them when possible. The only trigger you do not want to avoid is exercise. Pre-treatment with medicines before exercise can allow you to stay active yet avoid asthma symptoms. Common asthma triggers include: Infections (colds, viruses, flu, sinus infections)  Exercise  Weather (changes in temperature and/or humidity, cold air)  Tobacco smoke  Allergens (dust mites, pollens, pets, mold spores, cockroaches, and sometimes foods)  Irritants (strong odors from cleaning products, perfume, wood smoke, air pollution)  Strong emotions such as crying or laughing hard  Some medications   How is asthma diagnosed? To diagnose  asthma, your doctor will first review your medical history, family history, and symptoms. Your doctor will want to know any past history of breathing problems you may have had, as well as a family history of asthma, allergies, eczema (a bumpy, itchy skin rash caused by allergies), or other lung disease. It is important that you describe your symptoms in detail (cough, wheeze, shortness of  breath, chest tightness), including when and how often they occur. The doctor will perform a physical examination and listen to your heart and lungs. He or she may also order breathing tests, allergy tests, blood tests, and chest and sinus X-rays. The tests will find out if you do have asthma and if there are any other conditions that are contributing factors.  How is asthma treated? Asthma can be controlled, but not cured. It is not normal to have frequent symptoms, trouble sleeping, or trouble completing tasks. Appropriate asthma care will prevent symptoms and visits to the emergency room and hospital. Asthma medicines are one of the mainstays of asthma treatment. The drugs used to treat asthma are explained below.  Anti-inflammatories: These are the most important drugs for most people with asthma. Anti-inflammatory drugs reduce swelling and mucus production in the airways. As a result, airways are less sensitive and less likely to react to triggers. These medications need to be taken daily and may need to be taken for several weeks before they begin to control asthma. Anti-inflammatory medicines lead to fewer symptoms, better airflow, less sensitive airways, less airway damage, and fewer asthma attacks. If taken every day, they CONTROL or prevent asthma symptoms.   Bronchodilators: These drugs relax the muscle bands that tighten around the airways. This action opens the airways, letting more air in and out of the lungs and improving breathing. Bronchodilators also help clear mucus from the lungs. As the airways open, the mucus moves more freely and can be coughed out more easily. In short-acting forms, bronchodilators RELIEVE or stop asthma symptoms by quickly opening the airways and are very helpful during an asthma episode. In long-acting forms, bronchodilators provide CONTROL of asthma symptoms and prevent asthma episodes.  Asthma drugs can be taken in a variety of ways. Inhaling the medications by  using a metered dose inhaler, dry powder inhaler, or nebulizer is one way of taking asthma medicines. Oral medicines (pills or liquids you swallow) may also be prescribed.  Asthma severity Asthma is classified as either "intermittent" (comes and goes) or "persistent" (lasting). Persistent asthma is further described as being mild, moderate, or severe. The severity of asthma is based on how often you have symptoms both during the day and night, as well as by the results of lung function tests and by how well you can perform activities. The "severity" of asthma refers to how "intense" or "strong" your asthma is.  Asthma control Asthma control is the goal of asthma treatment. Regardless of your asthma severity, it may or may not be controlled. Asthma control means: You are able to do everything you want to do at work and home  You have no (or minimal) asthma symptoms  You do not wake up from your sleep or earlier than usual in the morning due to asthma  You rarely need to use your reliever medicine (inhaler)  Another major part of your treatment is that you are happy with your asthma care and believe your asthma is controlled.  Monitoring symptoms A key part of treatment is keeping track of how well your lungs are working.  Monitoring your symptoms, what they are, how and when they happen, and how severe they are, is an important part of being able to control your asthma.  Sometimes asthma is monitored using a peak flow meter. A peak flow (PF) meter measures how fast the air comes out of your lungs. It can help you know when your asthma is getting worse, sometimes even before you have symptoms. By taking daily peak flow readings, you can learn when to adjust medications to keep asthma under good control. It is also used to create your asthma action plan (see below). Your doctor can use your peak flow readings to adjust your treatment plan in some cases.  Asthma Action Plan Based on your history and  asthma severity, you and your doctor will develop a care plan called an "asthma action plan." The asthma action plan describes when and how to use your medicines, actions to take when asthma worsens, and when to seek emergency care. Make sure you understand this plan. If you do not, ask your asthma care provider any questions you may have. Your asthma action plan is one of the keys to controlling asthma. Keep it readily available to remind you of what you need to do every day to control asthma and what you need to do when symptoms occur.  Goals of asthma therapy These are the goals of asthma treatment: Live an active, normal life  Prevent chronic and troublesome symptoms  Attend work or school every day  Perform daily activities without difficulty  Stop urgent visits to the doctor, emergency department, or hospital  Use and adjust medications to control asthma with few or no side effects
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
## 2022-07-24 ENCOUNTER — Other Ambulatory Visit: Payer: Self-pay

## 2022-07-24 MED ORDER — METOPROLOL TARTRATE 25 MG PO TABS: 12.5000 mg | ORAL_TABLET | Freq: Two times a day (BID) | ORAL | 1 refills | Status: DC

## 2022-08-10 DIAGNOSIS — H35033 Hypertensive retinopathy, bilateral: Secondary | ICD-10-CM | POA: Diagnosis not present

## 2022-08-10 DIAGNOSIS — H43811 Vitreous degeneration, right eye: Secondary | ICD-10-CM | POA: Diagnosis not present

## 2022-08-10 DIAGNOSIS — H4311 Vitreous hemorrhage, right eye: Secondary | ICD-10-CM | POA: Diagnosis not present

## 2022-08-17 ENCOUNTER — Encounter: Payer: Self-pay | Admitting: Internal Medicine

## 2022-08-18 DIAGNOSIS — D485 Neoplasm of uncertain behavior of skin: Secondary | ICD-10-CM | POA: Diagnosis not present

## 2022-08-18 DIAGNOSIS — L570 Actinic keratosis: Secondary | ICD-10-CM | POA: Diagnosis not present

## 2022-08-18 DIAGNOSIS — D0339 Melanoma in situ of other parts of face: Secondary | ICD-10-CM | POA: Diagnosis not present

## 2022-08-18 DIAGNOSIS — L0100 Impetigo, unspecified: Secondary | ICD-10-CM | POA: Diagnosis not present

## 2022-08-18 DIAGNOSIS — C44329 Squamous cell carcinoma of skin of other parts of face: Secondary | ICD-10-CM | POA: Diagnosis not present

## 2022-08-20 ENCOUNTER — Other Ambulatory Visit: Payer: Self-pay | Admitting: *Deleted

## 2022-08-20 MED ORDER — FLUTICASONE-SALMETEROL 230-21 MCG/ACT IN AERO: 2.0000 | INHALATION_SPRAY | Freq: Two times a day (BID) | RESPIRATORY_TRACT | 3 refills | Status: DC

## 2022-08-21 DIAGNOSIS — D0339 Melanoma in situ of other parts of face: Secondary | ICD-10-CM | POA: Diagnosis not present

## 2022-08-21 DIAGNOSIS — D485 Neoplasm of uncertain behavior of skin: Secondary | ICD-10-CM | POA: Diagnosis not present

## 2022-08-26 ENCOUNTER — Encounter: Payer: Self-pay | Admitting: Internal Medicine

## 2022-08-27 ENCOUNTER — Other Ambulatory Visit: Payer: Self-pay

## 2022-08-27 DIAGNOSIS — I484 Atypical atrial flutter: Secondary | ICD-10-CM

## 2022-08-27 MED ORDER — APIXABAN 5 MG PO TABS: 5.0000 mg | ORAL_TABLET | Freq: Two times a day (BID) | ORAL | 1 refills | Status: DC

## 2022-08-27 MED ORDER — DILTIAZEM HCL ER COATED BEADS 180 MG PO CP24: 180.0000 mg | ORAL_CAPSULE | Freq: Every day | ORAL | 1 refills | Status: DC

## 2022-09-01 ENCOUNTER — Other Ambulatory Visit: Payer: Self-pay

## 2022-09-01 ENCOUNTER — Encounter: Payer: Self-pay | Admitting: Internal Medicine

## 2022-09-01 ENCOUNTER — Ambulatory Visit (INDEPENDENT_AMBULATORY_CARE_PROVIDER_SITE_OTHER): Payer: Medicare PPO | Admitting: Internal Medicine

## 2022-09-01 VITALS — BP 120/70 | HR 58 | Temp 98.0°F | Resp 16 | Ht 70.0 in | Wt 207.3 lb

## 2022-09-01 DIAGNOSIS — D0330 Melanoma in situ of unspecified part of face: Secondary | ICD-10-CM | POA: Diagnosis not present

## 2022-09-01 DIAGNOSIS — R053 Chronic cough: Secondary | ICD-10-CM | POA: Diagnosis not present

## 2022-09-01 DIAGNOSIS — J3089 Other allergic rhinitis: Secondary | ICD-10-CM

## 2022-09-01 DIAGNOSIS — D0339 Melanoma in situ of other parts of face: Secondary | ICD-10-CM | POA: Diagnosis not present

## 2022-09-01 DIAGNOSIS — H1013 Acute atopic conjunctivitis, bilateral: Secondary | ICD-10-CM | POA: Diagnosis not present

## 2022-09-01 MED ORDER — AZELASTINE HCL 0.05 % OP SOLN: 1.0000 [drp] | Freq: Two times a day (BID) | OPHTHALMIC | 5 refills | Status: DC | PRN

## 2022-09-01 MED ORDER — AZELASTINE HCL 0.1 % NA SOLN: 1.0000 | Freq: Two times a day (BID) | NASAL | 5 refills | Status: DC

## 2022-09-01 MED ORDER — CETIRIZINE HCL 10 MG PO TABS: 10.0000 mg | ORAL_TABLET | Freq: Every day | ORAL | 5 refills | Status: DC

## 2022-09-01 MED ORDER — FLUTICASONE PROPIONATE 50 MCG/ACT NA SUSP: 2.0000 | Freq: Every day | NASAL | 5 refills | Status: DC

## 2022-09-01 NOTE — Patient Instructions (Addendum)
Rhinitis: - Positive skin test 08/2022: dust mite, mold, cat - Avoidance measures discussed. - Use nasal saline rinses before nose sprays such as with Neilmed Sinus Rinse.  Use distilled water.   - Use Flonase 2 sprays each nostril daily. Aim upward and outward. - Use Azelastine 1 sprays each nostril twice daily as needed. Aim upward and outward. - Use Zyrtec 10 mg daily.  Okay to also take Allegra '180mg'$  in the evening when symptoms are uncontrolled.  - For eyes, use Azelastine 0.05% 1 eye drop daily as needed for itchy, watery eyes.  Available over the counter, if not covered by insurance.  - Consider allergy shots as long term control of your symptoms by teaching your immune system to be more tolerant of your allergy triggers   Asthma: - Continue follow up with Pulmonology. - Continue Advair HFA 230-21 2 puffs twice daily. Brush and gargle after.  - Rescue inhaler: Albuterol 2 puffs via spacer or 1 vial via nebulizer every 4-6 hours as needed for respiratory symptoms of cough, shortness of breath, or wheezing Asthma control goals:  Full participation in all desired activities (may need albuterol before activity) Albuterol use two times or less a week on average (not counting use with activity) Cough interfering with sleep two times or less a month Oral steroids no more than once a year No hospitalizations   ALLERGEN AVOIDANCE MEASURES   Dust Mites Use central air conditioning and heat; and change the filter monthly.  Pleated filters work better than mesh filters.  Electrostatic filters may also be used; wash the filter monthly.  Window air conditioners may be used, but do not clean the air as well as a central air conditioner.  Change or wash the filter monthly. Keep windows closed.  Do not use attic fans.   Encase the mattress, box springs and pillows with zippered, dust proof covers. Wash the bed linens in hot water weekly.   Remove carpet, especially from the bedroom. Remove  stuffed animals, throw pillows, dust ruffles, heavy drapes and other items that collect dust from the bedroom. Do not use a humidifier.   Use wood, vinyl or leather furniture instead of cloth furniture in the bedroom. Keep the indoor humidity at 30 - 40%.  Monitor with a humidity gauge.  Molds - Indoor avoidance Use air conditioning to reduce indoor humidity.  Do not use a humidifier. Keep indoor humidity at 30 - 40%.  Use a dehumidifier if needed. In the bathroom use an exhaust fan or open a window after showering.  Wipe down damp surfaces after showering.  Clean bathrooms with a mold-killing solution (diluted bleach, or products like Tilex, etc) at least once a month. In the kitchen use an exhaust fan to remove steam from cooking.  Throw away spoiled foods immediately, and empty garbage daily.  Empty water pans below self-defrosting refrigerators frequently. Vent the clothes dryer to the outside. Limit indoor houseplants; mold grows in the dirt.  No houseplants in the bedroom. Remove carpet from the bedroom. Encase the mattress and box springs with a zippered encasing.  Molds - Outdoor avoidance Avoid being outside when the grass is being mowed, or the ground is tilled. Avoid playing in leaves, pine straw, hay, etc.  Dead plant materials contain mold. Avoid going into barns or grain storage areas. Remove leaves, clippings and compost from around the home.  Pet Dander Keep the pet out of your bedroom and restrict it to only a few rooms. Be advised that keeping  the pet in only one room will not limit the allergens to that room. Don't pet, hug or kiss the pet; if you do, wash your hands with soap and water. High-efficiency particulate air (HEPA) cleaners run continuously in a bedroom or living room can reduce allergen levels over time. Regular use of a high-efficiency vacuum cleaner or a central vacuum can reduce allergen levels. Giving your pet a bath at least once a week can reduce  airborne allergen.
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
## 2022-09-02 DIAGNOSIS — D0330 Melanoma in situ of unspecified part of face: Secondary | ICD-10-CM | POA: Diagnosis not present

## 2022-09-20 DIAGNOSIS — J40 Bronchitis, not specified as acute or chronic: Secondary | ICD-10-CM | POA: Diagnosis not present

## 2022-09-24 ENCOUNTER — Ambulatory Visit (INDEPENDENT_AMBULATORY_CARE_PROVIDER_SITE_OTHER): Payer: Medicare PPO | Admitting: Nurse Practitioner

## 2022-09-24 ENCOUNTER — Encounter: Payer: Self-pay | Admitting: Nurse Practitioner

## 2022-09-24 VITALS — BP 136/72 | HR 58 | Ht 70.0 in | Wt 205.0 lb

## 2022-09-24 DIAGNOSIS — J209 Acute bronchitis, unspecified: Secondary | ICD-10-CM

## 2022-09-24 DIAGNOSIS — J4540 Moderate persistent asthma, uncomplicated: Secondary | ICD-10-CM

## 2022-09-24 MED ORDER — PROMETHAZINE-DM 6.25-15 MG/5ML PO SYRP: 5.0000 mL | ORAL_SOLUTION | Freq: Four times a day (QID) | ORAL | 0 refills | Status: DC | PRN

## 2022-09-24 MED ORDER — BENZONATATE 200 MG PO CAPS: 200.0000 mg | ORAL_CAPSULE | Freq: Three times a day (TID) | ORAL | 1 refills | Status: DC | PRN

## 2022-09-24 NOTE — Assessment & Plan Note (Signed)
Resolving asthmatic bronchitis; clinically improving. Likely secondary to viral illness given constellation of symptoms. Unable to complete FeNO today. He will complete doxycycline and prednisone courses. Advised on cough control measures to limit further inflammation/irritation. He will continue ICS/LABA therapy. Asthma action plan in place.  Patient Instructions  Complete prednisone as previously prescribed Complete doxycycline as previously prescribed Continue Albuterol inhaler 2 puffs every 6 hours as needed for shortness of breath or wheezing. Notify if symptoms persist despite rescue inhaler/neb use.  Continue Advair 2 puffs Twice daily. Brush tongue and rinse mouth afterwards Continue flonase nasal spray 2 sprays each nostril daily Continue cetirizine 1 tab daily for allergies Continue phenergan DM cough syrup 5 mL every 6 hours as needed for cough. Refill sent. May cause drowsiness. Do not drive after taking  Benzonatate 1 capsule Three times a day for cough. Use consistently over the next 3-4 days to help suppress your cough Guaifenesin [phone] mg Twice daily as needed for chest congestion  If your cough worsens after  you complete antibiotics and steroids, or you develop new fevers, chills, fatigue, shortness of breath, please call for sooner follow up or seek emergency care, if appropriate  Follow up in 3 months with Dr. Shearon Stalls. If symptoms do not improve or worsen, please contact office for sooner follow up or seek emergency care.
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
## 2022-09-24 NOTE — Patient Instructions (Signed)
Complete prednisone as previously prescribed Complete doxycycline as previously prescribed Continue Albuterol inhaler 2 puffs every 6 hours as needed for shortness of breath or wheezing. Notify if symptoms persist despite rescue inhaler/neb use.  Continue Advair 2 puffs Twice daily. Brush tongue and rinse mouth afterwards Continue flonase nasal spray 2 sprays each nostril daily Continue cetirizine 1 tab daily for allergies Continue phenergan DM cough syrup 5 mL every 6 hours as needed for cough. Refill sent. May cause drowsiness. Do not drive after taking  Benzonatate 1 capsule Three times a day for cough. Use consistently over the next 3-4 days to help suppress your cough Guaifenesin [phone] mg Twice daily as needed for chest congestion  If your cough worsens after  you complete antibiotics and steroids, or you develop new fevers, chills, fatigue, shortness of breath, please call for sooner follow up or seek emergency care, if appropriate  Follow up in 3 months with Dr. Shearon Stalls. If symptoms do not improve or worsen, please contact office for sooner follow up or seek emergency care.

## 2022-09-30 DIAGNOSIS — Z85820 Personal history of malignant melanoma of skin: Secondary | ICD-10-CM | POA: Diagnosis not present

## 2022-09-30 DIAGNOSIS — Z5189 Encounter for other specified aftercare: Secondary | ICD-10-CM | POA: Diagnosis not present

## 2022-10-02 ENCOUNTER — Other Ambulatory Visit: Payer: Self-pay | Admitting: Cardiology

## 2022-10-13 ENCOUNTER — Encounter: Payer: Self-pay | Admitting: Internal Medicine

## 2022-10-13 ENCOUNTER — Other Ambulatory Visit: Payer: Self-pay

## 2022-10-13 ENCOUNTER — Ambulatory Visit (INDEPENDENT_AMBULATORY_CARE_PROVIDER_SITE_OTHER): Payer: Medicare PPO | Admitting: Internal Medicine

## 2022-10-13 VITALS — BP 124/78 | HR 63 | Temp 98.2°F | Resp 16

## 2022-10-13 DIAGNOSIS — J3089 Other allergic rhinitis: Secondary | ICD-10-CM

## 2022-10-13 DIAGNOSIS — H1013 Acute atopic conjunctivitis, bilateral: Secondary | ICD-10-CM

## 2022-10-13 MED ORDER — CETIRIZINE HCL 10 MG PO TABS: 10.0000 mg | ORAL_TABLET | Freq: Every day | ORAL | 11 refills | Status: AC

## 2022-10-13 MED ORDER — AZELASTINE HCL 0.1 % NA SOLN: 1.0000 | Freq: Two times a day (BID) | NASAL | 11 refills | Status: AC

## 2022-10-13 MED ORDER — AZELASTINE HCL 0.05 % OP SOLN: 1.0000 [drp] | Freq: Two times a day (BID) | OPHTHALMIC | 5 refills | Status: AC | PRN

## 2022-10-13 MED ORDER — FLUTICASONE PROPIONATE 50 MCG/ACT NA SUSP: 2.0000 | Freq: Every day | NASAL | 11 refills | Status: DC

## 2022-10-13 NOTE — Patient Instructions (Addendum)
Allergic Rhinitis: - Positive skin test 08/2022: dust mite, mold, cat - Avoidance measures discussed. - Use nasal saline rinses before nose sprays such as with Neilmed Sinus Rinse.  Use distilled water.   - Use Flonase 2 sprays each nostril daily. Aim upward and outward. - Use Azelastine 1 sprays each nostril twice daily as needed. Aim upward and outward. - Use Zyrtec 10 mg daily.  Okay to also take Allegra '180mg'$  in the evening when symptoms are uncontrolled.  - For eyes, use Azelastine 0.05% 1 eye drop daily as needed for itchy, watery eyes.  Available over the counter, if not covered by insurance.  - Consider allergy shots as long term control of your symptoms by teaching your immune system to be more tolerant of your allergy triggers   Asthma: - Followed by Dr. Shearon Stalls.  Continue follow up with Pulmonology. - Continue Advair HFA 230-21 2 puffs twice daily. Brush and gargle after.  - Rescue inhaler: Albuterol 2 puffs via spacer or 1 vial via nebulizer every 4-6 hours as needed for respiratory symptoms of cough, shortness of breath, or wheezing

## 2022-10-13 NOTE — Progress Notes (Signed)
FOLLOW UP Date of Service/Encounter:  10/13/22   Subjective:  Jeffery Underwood (DOB: 02-21-1956) is a 67 y.o. male who returns to the [facility] on 10/13/2022 for follow up for allergic rhino conjunctivitis.  patient History obtained from: chart review and patient.  Last seen by me on 09/01/2022 and had positive SPT for dust mite, mold and cat.  Started on Flonase/Zyrtec/Azelastine eye drops.    He is followed by [facility] for asthma and generally sees Dr. Shearon Stalls. He was seen 09/24/2022 by them for acute bronchitis after an urgent care visit where he was treated with doxycycline and prednisone.  This was thought to be likely a viral illness.  Informed to continue Advair.   Since last visit, he reports his rhinitis symptoms are doing a lot better.  He has not had much congestion, runny nose or post nasal drainage. No ocular symptoms.   He also moved plants out of his room and thinks it has helped a lot.  Using Flonase 2 SEN daily and Zyrtec '10mg'$  daily.  Has not required Azelastine nose spray or eye drops.   The past week, he also had a mild irritation in his throat, slightly sore. No fevers or loss of energy.    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 Physical Exam: GEN: alert, well developed HEENT: clear conjunctiva, TM grey and translucent, nose with moderate inferior turbinate hypertrophy, pink nasal mucosa, slight clear rhinorrhea, + cobblestoning HEART: regular rate and rhythm, no murmur LUNGS: clear to auscultation bilaterally, no coughing, unlabored respiration SKIN: no rashes or lesions  Assessment/Plan   Allergic Rhinitis: - Controlled with medical management as discussed below.  - Positive skin test 08/2022: dust mite, mold, cat - Avoidance measures discussed. - Use nasal saline rinses before nose sprays such as with Neilmed Sinus Rinse.  Use distilled water.   - Use Flonase 2 sprays each nostril daily. Aim upward and outward. - Use Azelastine 1 sprays each nostril twice daily as needed. Aim upward and outward. - Use Zyrtec 10 mg daily.  Okay to also take Allegra '180mg'$  in the evening when symptoms are uncontrolled.  - For eyes, use Azelastine 0.05% 1 eye drop daily as needed for itchy, watery eyes.  Available over the counter, if not covered by insurance.  - Consider allergy shots as long term control of your symptoms by teaching your immune system to be more tolerant of your allergy triggers  Asthma: - Followed by Dr. Shearon Stalls.  Continue follow up with Pulmonology. -  Continue Advair HFA 230-21 2 puffs twice daily. Brush and gargle after.  - Rescue inhaler: Albuterol 2 puffs via spacer or 1 vial via nebulizer every 4-6 hours as needed for respiratory symptoms of cough, shortness of breath, or wheezing    Return in about 1 year (around 10/14/2023). Harlon Flor, MD  [facility]

## 2022-11-18 DIAGNOSIS — L570 Actinic keratosis: Secondary | ICD-10-CM | POA: Diagnosis not present

## 2022-11-18 DIAGNOSIS — C44229 Squamous cell carcinoma of skin of left ear and external auricular canal: Secondary | ICD-10-CM | POA: Diagnosis not present

## 2022-11-18 DIAGNOSIS — L0100 Impetigo, unspecified: Secondary | ICD-10-CM | POA: Diagnosis not present

## 2022-11-18 DIAGNOSIS — D485 Neoplasm of uncertain behavior of skin: Secondary | ICD-10-CM | POA: Diagnosis not present

## 2022-11-18 DIAGNOSIS — D0461 Carcinoma in situ of skin of right upper limb, including shoulder: Secondary | ICD-10-CM | POA: Diagnosis not present

## 2022-11-18 DIAGNOSIS — D0422 Carcinoma in situ of skin of left ear and external auricular canal: Secondary | ICD-10-CM | POA: Diagnosis not present

## 2022-12-24 DIAGNOSIS — C44329 Squamous cell carcinoma of skin of other parts of face: Secondary | ICD-10-CM | POA: Diagnosis not present

## 2023-01-07 DIAGNOSIS — I700 Atherosclerosis of aorta: Secondary | ICD-10-CM | POA: Diagnosis not present

## 2023-01-07 DIAGNOSIS — Z9989 Dependence on other enabling machines and devices: Secondary | ICD-10-CM | POA: Diagnosis not present

## 2023-01-07 DIAGNOSIS — Z8639 Personal history of other endocrine, nutritional and metabolic disease: Secondary | ICD-10-CM | POA: Diagnosis not present

## 2023-01-07 DIAGNOSIS — I484 Atypical atrial flutter: Secondary | ICD-10-CM | POA: Diagnosis not present

## 2023-01-07 DIAGNOSIS — F339 Major depressive disorder, recurrent, unspecified: Secondary | ICD-10-CM | POA: Diagnosis not present

## 2023-01-07 DIAGNOSIS — R5383 Other fatigue: Secondary | ICD-10-CM | POA: Diagnosis not present

## 2023-01-07 DIAGNOSIS — Z87898 Personal history of other specified conditions: Secondary | ICD-10-CM | POA: Diagnosis not present

## 2023-01-12 ENCOUNTER — Ambulatory Visit: Payer: Medicare PPO | Admitting: Cardiology

## 2023-01-12 ENCOUNTER — Encounter: Payer: Self-pay | Admitting: Cardiology

## 2023-01-12 VITALS — BP 117/68 | HR 63 | Resp 15 | Ht 70.0 in | Wt 205.0 lb

## 2023-01-12 DIAGNOSIS — I4819 Other persistent atrial fibrillation: Secondary | ICD-10-CM

## 2023-01-12 DIAGNOSIS — I484 Atypical atrial flutter: Secondary | ICD-10-CM

## 2023-01-12 DIAGNOSIS — R6889 Other general symptoms and signs: Secondary | ICD-10-CM | POA: Diagnosis not present

## 2023-01-12 DIAGNOSIS — R0609 Other forms of dyspnea: Secondary | ICD-10-CM | POA: Diagnosis not present

## 2023-01-12 NOTE — Progress Notes (Signed)
Follow up visit  Subjective:   Jeffery Underwood, male    DOB: Sep 15, 1956, 67 y.o.   MRN: [id number]   HPI  Chief Complaint  Patient presents with   Atrial Flutter   Fatigue   Follow-up    67 y.o. Caucasian male with hypertension,OSA on CPAP, atypical atrial flutter/fibrillation.  In the past few weeks, patient has had 3 distinct episodes of feeling extreme exhaustion during physical work (such as cutting grass). While he denies any specific chest pain or dyspnea, he states that he could not take one more step due to the exhaustion. He denies any palpitations symptoms, although he has not got a smart watch to monitor.   Current Outpatient Medications:    acetaminophen (TYLENOL) 325 MG tablet, Take 650 mg by mouth every 6 (six) hours as needed., Disp: , Rfl:    albuterol (VENTOLIN HFA) 108 (90 Base) MCG/ACT inhaler, Inhale 2 puffs into the lungs as needed for wheezing or shortness of breath., Disp: 1 each, Rfl: 5   apixaban (ELIQUIS) 5 MG TABS tablet, Take 1 tablet (5 mg total) by mouth 2 (two) times daily., Disp: 180 tablet, Rfl: 1   azelastine (ASTELIN) 0.1 % nasal spray, Place 1 spray into both nostrils 2 (two) times daily. Use in each nostril as directed, Disp: 30 mL, Rfl: 11   azelastine (OPTIVAR) 0.05 % ophthalmic solution, Apply 1 drop to eye 2 (two) times daily as needed (itchy watery eyes)., Disp: 6 mL, Rfl: 5   b complex vitamins capsule, Take 1 capsule by mouth daily., Disp: , Rfl:    benzonatate (TESSALON) 200 MG capsule, Take 1 capsule (200 mg total) by mouth 3 (three) times daily as needed for cough., Disp: 30 capsule, Rfl: 1   buPROPion (WELLBUTRIN XL) 300 MG 24 hr tablet, Take 300 mg by mouth in the morning., Disp: , Rfl:    cetirizine (ZYRTEC) 10 MG tablet, Take 1 tablet (10 mg total) by mouth daily., Disp: 30 tablet, Rfl: 11   diltiazem (CARDIZEM CD) 180 MG 24 hr capsule, Take 1 capsule (180 mg total) by mouth daily., Disp: 90 capsule, Rfl: 1   fluticasone (FLONASE) 50  MCG/ACT nasal spray, Place 2 sprays into both nostrils daily., Disp: 16 g, Rfl: 11   fluticasone-salmeterol (ADVAIR HFA) 230-21 MCG/ACT inhaler, Inhale 2 puffs into the lungs 2 (two) times daily., Disp: 36 g, Rfl: 3   lisinopril (ZESTRIL) 5 MG tablet, Take 5 mg by mouth every evening., Disp: , Rfl:    metoprolol tartrate (LOPRESSOR) 25 MG tablet, TAKE 1/2 TABLET TWICE DAILY, Disp: 90 tablet, Rfl: 3   Multiple Vitamins-Minerals (MULTIVITAMIN PO), Take 1 tablet by mouth at bedtime., Disp: , Rfl:    promethazine-dextromethorphan (PROMETHAZINE-DM) 6.25-15 MG/5ML syrup, Take 5 mLs by mouth 4 (four) times daily as needed for cough., Disp: 180 mL, Rfl: 0   Respiratory Therapy Supplies (CARETOUCH 2 CPAP HOSE HANGER) MISC, See admin instructions., Disp: , Rfl:    vitamin E 1000 UNIT capsule, Take 1,000 Units by mouth at bedtime., Disp: , Rfl:    Cardiovascular & other pertient studies:  Reviewed external labs and tests, independently interpreted  EKG 01/12/2023: Sinus rhythm 61 bpm First degree A-V block  No signifciant change compared to previous EKG on 02/19/2022   Cardioversion 07/2021  EKG 06/21/2022: Sinus rhythm Prolonged PR interval Abnormal R-wave progression, early transition  Echocardiogram 07/07/2021: Left ventricle cavity is normal in size. Mild concentric hypertrophy of the left ventricle. Normal global wall motion. Normal LV  systolic function with visual EF 50-55%. Indeterminate diastolic filling pattern. Left atrial cavity is moderately dilated. Mild (Grade I) mitral regurgitation. Estimated right atrial pressure 8 mmHg.  Exercise Tetrofosmin stress test 07/08/2021: Exercise nuclear stress test was performed using Bruce protocol. Patient reached 6 METS, and 97% of age predicted maximum heart rate. Exercise capacity was fair. No chest pain reported, dyspnea reported. Heart rate and hemodynamic response were normal.  Rest and stress EKG showed Afib w/RVR, no significant ST-T  changes.  Normal myocardial perfusion. Stress LVEF 66%.   Recent labs: 06/21/2022: Glucose 128, BUN/Cr 17/1.18. EGFR >60. Na/K 140/4.2. Rest of the CMP normal H/H 13/38. MCV 91. Platelets 206   Review of Systems  Cardiovascular:  Negative for chest pain, dyspnea on exertion, leg swelling, palpitations and syncope.       Decreased exercise tolerance        Vitals:   01/12/23 1240  BP: 117/68  Pulse: 63  Resp: 15  SpO2: 93%    Body mass index is 29.41 kg/m. Filed Weights   01/12/23 1240  Weight: 205 lb (93 kg)    Objective:   Physical Exam Vitals and nursing note reviewed.  Constitutional:      General: He is not in acute distress. Neck:     Vascular: No JVD.  Cardiovascular:     Rate and Rhythm: Normal rate and regular rhythm.     Heart sounds: Normal heart sounds. No murmur heard. Pulmonary:     Effort: Pulmonary effort is normal.     Breath sounds: Normal breath sounds. No wheezing or rales.  Musculoskeletal:     Right lower leg: No edema.     Left lower leg: No edema.             Visit diagnoses:   ICD-10-CM   1. Dyspnea on exertion  R06.09 EKG 12-Lead    2. Decreased exercise tolerance  R68.89 PCV ECHOCARDIOGRAM COMPLETE    PCV MYOCARDIAL PERFUSION WO LEXISCAN    CT CARDIAC SCORING (SELF PAY ONLY)    3. Atypical atrial flutter  I48.4     4. Persistent atrial fibrillation  I48.19        Orders Placed This Encounter  Procedures   CT CARDIAC SCORING (SELF PAY ONLY)   PCV MYOCARDIAL PERFUSION WO LEXISCAN   EKG 12-Lead   PCV ECHOCARDIOGRAM COMPLETE     Medication changes this visit: Medications Discontinued During This Encounter  Medication Reason   benzonatate (TESSALON) 200 MG capsule    promethazine-dextromethorphan (PROMETHAZINE-DM) 6.25-15 MG/5ML syrup     No orders of the defined types were placed in this encounter.    Assessment & Recommendations:   67 y.o.  Caucasian male with hypertension,OSA on CPAP, h/o persistent  atypical atrial flutter/fibrillation.  Decreased exercise tolerance: Marked decrease in exercise tolerance in the last few weeks. Symptoms are concerning for angina equivalent. Although negative in 06/2021, recommend repeating echocardiogram, exercise nuclear stress test, along with CT cardiac scoring.  H/o persistent Afib/flutter: In sinus rhythm today. Encouraged to use smart watch regularly. CHA2DS2VASc score 2, annual stroke risk 2,3% Continue Eliquis 5 mg bid.   F/u after tests    Elder Negus, MD Pager: [phone] Office: [phone]

## 2023-01-18 ENCOUNTER — Other Ambulatory Visit: Payer: Self-pay | Admitting: Cardiology

## 2023-01-18 ENCOUNTER — Other Ambulatory Visit: Payer: Self-pay | Admitting: Internal Medicine

## 2023-01-18 DIAGNOSIS — I484 Atypical atrial flutter: Secondary | ICD-10-CM

## 2023-01-19 DIAGNOSIS — D485 Neoplasm of uncertain behavior of skin: Secondary | ICD-10-CM | POA: Diagnosis not present

## 2023-01-19 DIAGNOSIS — L570 Actinic keratosis: Secondary | ICD-10-CM | POA: Diagnosis not present

## 2023-01-19 DIAGNOSIS — D0439 Carcinoma in situ of skin of other parts of face: Secondary | ICD-10-CM | POA: Diagnosis not present

## 2023-01-20 DIAGNOSIS — C44329 Squamous cell carcinoma of skin of other parts of face: Secondary | ICD-10-CM | POA: Diagnosis not present

## 2023-01-27 ENCOUNTER — Ambulatory Visit (HOSPITAL_COMMUNITY)
Admission: RE | Admit: 2023-01-27 | Discharge: 2023-01-27 | Disposition: A | Discharge status: Home / Self Care | Payer: Medicare PPO | Source: Ambulatory Visit | Attending: Cardiology | Admitting: Cardiology

## 2023-01-27 DIAGNOSIS — R6889 Other general symptoms and signs: Secondary | ICD-10-CM | POA: Insufficient documentation

## 2023-01-28 DIAGNOSIS — B999 Unspecified infectious disease: Secondary | ICD-10-CM | POA: Diagnosis not present

## 2023-01-29 ENCOUNTER — Ambulatory Visit: Payer: Medicare PPO

## 2023-01-29 DIAGNOSIS — R6889 Other general symptoms and signs: Secondary | ICD-10-CM

## 2023-01-29 DIAGNOSIS — I10 Essential (primary) hypertension: Secondary | ICD-10-CM | POA: Diagnosis not present

## 2023-01-29 DIAGNOSIS — R0609 Other forms of dyspnea: Secondary | ICD-10-CM | POA: Diagnosis not present

## 2023-02-03 DIAGNOSIS — G4733 Obstructive sleep apnea (adult) (pediatric): Secondary | ICD-10-CM | POA: Diagnosis not present

## 2023-02-03 NOTE — Progress Notes (Signed)
Follow up visit  Subjective:   Jeffery Underwood, male    DOB: 1956/02/28, 67 y.o.   MRN: [id number]   HPI  Chief Complaint  Patient presents with   Hypertension   Atrial Flutter   Atrial Fibrillation   Follow-up    1 year    67 y.o. Caucasian male with hypertension,OSA on CPAP, atypical atrial flutter/fibrillation.  Patient has not had any more episodes of extreme exhaustion.  He has been walking without any significant chest pain or shortness of breath.  Heart rate stays in 90s after exercise, but comes down to 60s at rest.. Reviewed recent test results with the patient, details below.   Initial consultation visit 12/2022: In the past few weeks, patient has had 3 distinct episodes of feeling extreme exhaustion during physical work (such as cutting grass). While he denies any specific chest pain or dyspnea, he states that he could not take one more step due to the exhaustion. He denies any palpitations symptoms, although he has not got a smart watch to monitor.   Current Outpatient Medications:    acetaminophen (TYLENOL) 325 MG tablet, Take 650 mg by mouth every 6 (six) hours as needed., Disp: , Rfl:    albuterol (VENTOLIN HFA) 108 (90 Base) MCG/ACT inhaler, Inhale 2 puffs into the lungs as needed for wheezing or shortness of breath., Disp: 1 each, Rfl: 5   azelastine (ASTELIN) 0.1 % nasal spray, Place 1 spray into both nostrils 2 (two) times daily. Use in each nostril as directed, Disp: 30 mL, Rfl: 11   azelastine (OPTIVAR) 0.05 % ophthalmic solution, Apply 1 drop to eye 2 (two) times daily as needed (itchy watery eyes)., Disp: 6 mL, Rfl: 5   b complex vitamins capsule, Take 1 capsule by mouth daily., Disp: , Rfl:    buPROPion (WELLBUTRIN XL) 300 MG 24 hr tablet, Take 300 mg by mouth in the morning., Disp: , Rfl:    cetirizine (ZYRTEC) 10 MG tablet, Take 1 tablet (10 mg total) by mouth daily., Disp: 30 tablet, Rfl: 11   diltiazem (CARDIZEM CD) 180 MG 24 hr capsule, TAKE 1 CAPSULE EVERY  DAY, Disp: 90 capsule, Rfl: 3   ELIQUIS 5 MG TABS tablet, TAKE 1 TABLET TWICE DAILY, Disp: 180 tablet, Rfl: 3   fluticasone (FLONASE) 50 MCG/ACT nasal spray, Place 2 sprays into both nostrils daily., Disp: 16 g, Rfl: 11   fluticasone-salmeterol (ADVAIR HFA) 230-21 MCG/ACT inhaler, Inhale 2 puffs into the lungs 2 (two) times daily., Disp: 36 g, Rfl: 3   lisinopril (ZESTRIL) 5 MG tablet, Take 5 mg by mouth every evening., Disp: , Rfl:    metoprolol tartrate (LOPRESSOR) 25 MG tablet, TAKE 1/2 TABLET TWICE DAILY, Disp: 90 tablet, Rfl: 3   Multiple Vitamins-Minerals (MULTIVITAMIN PO), Take 1 tablet by mouth at bedtime., Disp: , Rfl:    Respiratory Therapy Supplies (CARETOUCH 2 CPAP HOSE HANGER) MISC, See admin instructions., Disp: , Rfl:    vitamin E 1000 UNIT capsule, Take 1,000 Units by mouth at bedtime., Disp: , Rfl:    Cardiovascular & other pertient studies:  Reviewed external labs and tests, independently interpreted  EKG 02/12/2023: Sinus rhythm 60 bpm First degree A-V block   EKG 01/12/2023: Sinus rhythm 61 bpm First degree A-V block  No signifciant change compared to previous EKG on 02/19/2022  CT cardiac scoring 01/27/2023: LM: 0 LAD: 91.7 Lcx: 18.9 RCA: 3  Total: 114  Percentile: 52nd   Pericardium: Normal. Ascending Aorta: Normal caliber. Ascending aorta measures  approximately 34mm at the mid ascending aorta measured in a non-contrast axial plane.   Non-cardiac: See separate report from [facility].   IMPRESSION: Coronary calcium score of 114. This was 52nd percentile for age-, race-, and sex-matched controls.  Echocardiogram 01/29/2023: Normal LV systolic function with visual EF 55-60%. Left ventricle cavity is normal in size. Normal left ventricular wall thickness. Normal global wall motion. Normal diastolic filling pattern, normal LAP. Calculated EF 53%. Left atrial cavity is mildly dilated. Structurally normal tricuspid valve with no regurgitation. No  evidence of pulmonary hypertension. No significant change compared to 06/2021.   Exercise nuclear stress test 02/04/2023: Myocardial perfusion is normal. Overall LV systolic function is normal without regional wall motion abnormalities. Stress LV EF: 68%. Low risk study. Normal ECG stress. Peak EKG/ECG demonstrated sinus tachycardia. The patient exercised for 7 minutes and 59 seconds of a Bruce protocol, achieving approximately 10.13 METs and 87% MPHR.  The heart rate response was normal. The blood pressure response was normal. No previous exam available for comparison.  Cardioversion 07/2021  EKG 06/21/2022: Sinus rhythm Prolonged PR interval Abnormal R-wave progression, early transition  Recent labs: 01/07/2023: Glucose 111, BUN/Cr 15/1.1. EGFR 69. K 4.5. Hb 14 HbA1C 5.5% Chol 172, TG 129, HDL 42, LDL 107 TSH 1.2 normal  06/21/2022: Glucose 128, BUN/Cr 17/1.18. EGFR >60. Na/K 140/4.2. Rest of the CMP normal H/H 13/38. MCV 91. Platelets 206   Review of Systems  Cardiovascular:  Negative for chest pain, dyspnea on exertion, leg swelling, palpitations and syncope.       Decreased exercise tolerance        Vitals:   02/12/23 0942  BP: 127/78  Pulse: 64  SpO2: 96%     Body mass index is 29.41 kg/m. Filed Weights   02/12/23 0942  Weight: 205 lb (93 kg)    Objective:   Physical Exam Vitals and nursing note reviewed.  Constitutional:      General: He is not in acute distress. Neck:     Vascular: No JVD.  Cardiovascular:     Rate and Rhythm: Normal rate and regular rhythm.     Heart sounds: Normal heart sounds. No murmur heard. Pulmonary:     Effort: Pulmonary effort is normal.     Breath sounds: Normal breath sounds. No wheezing or rales.  Musculoskeletal:     Right lower leg: No edema.     Left lower leg: No edema.             Visit diagnoses: No diagnosis found.    No orders of the defined types were placed in this  encounter.    Medication changes this visit: There are no discontinued medications.   No orders of the defined types were placed in this encounter.    Assessment & Recommendations:   67 y.o.  Caucasian male with hypertension,OSA on CPAP, h/o persistent atypical atrial flutter/fibrillation.  Decreased exercise tolerance: Episodes of exertion without any significant abnormalities on echocardiogram on stress testing.  Good exercise capacity.  I suspect he may have had A-fib when he had those episodes of exertion.  He started wearing a smart watch only after those episodes.  Recommend monitoring at home, and let me know if he sees any A-fib.  Elevated calcium score: Total: 114  Percentile: 52nd, none in left main Chol 172, TG 129, HDL 42, LDL 107 (12/2022) In addition to heart healthy diet and lifestyle, recommend Crestor 10 mg daily.  Repeat lipid panel and lipoprotein a in 3  months.   H/o persistent Afib/flutter: In sinus rhythm today. Encouraged to use smart watch regularly. CHA2DS2VASc score 2, annual stroke risk 2,3% Continue Eliquis 5 mg bid.   F/u in 6 months    Elder Negus, MD Pager: [phone] Office: [phone]

## 2023-02-04 ENCOUNTER — Ambulatory Visit: Payer: Medicare PPO

## 2023-02-04 DIAGNOSIS — I4819 Other persistent atrial fibrillation: Secondary | ICD-10-CM | POA: Diagnosis not present

## 2023-02-04 DIAGNOSIS — R6889 Other general symptoms and signs: Secondary | ICD-10-CM

## 2023-02-04 DIAGNOSIS — I484 Atypical atrial flutter: Secondary | ICD-10-CM | POA: Diagnosis not present

## 2023-02-08 DIAGNOSIS — Z961 Presence of intraocular lens: Secondary | ICD-10-CM | POA: Diagnosis not present

## 2023-02-08 DIAGNOSIS — H35033 Hypertensive retinopathy, bilateral: Secondary | ICD-10-CM | POA: Diagnosis not present

## 2023-02-08 DIAGNOSIS — H43822 Vitreomacular adhesion, left eye: Secondary | ICD-10-CM | POA: Diagnosis not present

## 2023-02-08 DIAGNOSIS — H43811 Vitreous degeneration, right eye: Secondary | ICD-10-CM | POA: Diagnosis not present

## 2023-02-09 DIAGNOSIS — Z5189 Encounter for other specified aftercare: Secondary | ICD-10-CM | POA: Diagnosis not present

## 2023-02-11 DIAGNOSIS — D0430 Carcinoma in situ of skin of unspecified part of face: Secondary | ICD-10-CM | POA: Diagnosis not present

## 2023-02-11 DIAGNOSIS — D485 Neoplasm of uncertain behavior of skin: Secondary | ICD-10-CM | POA: Diagnosis not present

## 2023-02-11 DIAGNOSIS — L821 Other seborrheic keratosis: Secondary | ICD-10-CM | POA: Diagnosis not present

## 2023-02-11 DIAGNOSIS — L820 Inflamed seborrheic keratosis: Secondary | ICD-10-CM | POA: Diagnosis not present

## 2023-02-11 DIAGNOSIS — L570 Actinic keratosis: Secondary | ICD-10-CM | POA: Diagnosis not present

## 2023-02-12 ENCOUNTER — Ambulatory Visit: Payer: Medicare PPO | Admitting: Cardiology

## 2023-02-12 ENCOUNTER — Encounter: Payer: Self-pay | Admitting: Cardiology

## 2023-02-12 VITALS — BP 127/78 | HR 64 | Ht 70.0 in | Wt 205.0 lb

## 2023-02-12 DIAGNOSIS — E782 Mixed hyperlipidemia: Secondary | ICD-10-CM | POA: Diagnosis not present

## 2023-02-12 DIAGNOSIS — I4819 Other persistent atrial fibrillation: Secondary | ICD-10-CM

## 2023-02-12 DIAGNOSIS — R931 Abnormal findings on diagnostic imaging of heart and coronary circulation: Secondary | ICD-10-CM | POA: Diagnosis not present

## 2023-02-12 DIAGNOSIS — I484 Atypical atrial flutter: Secondary | ICD-10-CM

## 2023-02-12 DIAGNOSIS — I10 Essential (primary) hypertension: Secondary | ICD-10-CM | POA: Diagnosis not present

## 2023-02-12 MED ORDER — ROSUVASTATIN CALCIUM 10 MG PO TABS: 10.0000 mg | ORAL_TABLET | Freq: Every day | ORAL | 3 refills | Status: DC

## 2023-02-19 ENCOUNTER — Ambulatory Visit: Payer: Medicare PPO | Admitting: Cardiology

## 2023-02-19 ENCOUNTER — Ambulatory Visit: Payer: Medicare PPO | Admitting: Student

## 2023-02-24 DIAGNOSIS — C44329 Squamous cell carcinoma of skin of other parts of face: Secondary | ICD-10-CM | POA: Diagnosis not present

## 2023-03-01 ENCOUNTER — Encounter: Payer: Self-pay | Admitting: Cardiology

## 2023-03-01 NOTE — Telephone Encounter (Signed)
From patient.

## 2023-03-24 DIAGNOSIS — C44329 Squamous cell carcinoma of skin of other parts of face: Secondary | ICD-10-CM | POA: Diagnosis not present

## 2023-03-26 ENCOUNTER — Encounter: Payer: Self-pay | Admitting: Internal Medicine

## 2023-03-26 DIAGNOSIS — J189 Pneumonia, unspecified organism: Secondary | ICD-10-CM | POA: Diagnosis not present

## 2023-03-26 DIAGNOSIS — R059 Cough, unspecified: Secondary | ICD-10-CM | POA: Diagnosis not present

## 2023-03-30 NOTE — Telephone Encounter (Signed)
Called and spoke with patient. I was able to get him scheduled with Dr. Celine Mans for 05/03/23 at 11am. Patient verbalized understanding of appt and office location.   Nothing further needed at time of call.

## 2023-04-01 DIAGNOSIS — C44329 Squamous cell carcinoma of skin of other parts of face: Secondary | ICD-10-CM | POA: Diagnosis not present

## 2023-05-03 ENCOUNTER — Encounter: Payer: Self-pay | Admitting: Internal Medicine

## 2023-05-03 ENCOUNTER — Ambulatory Visit (INDEPENDENT_AMBULATORY_CARE_PROVIDER_SITE_OTHER): Payer: Medicare PPO | Admitting: Internal Medicine

## 2023-05-03 ENCOUNTER — Telehealth: Payer: Self-pay | Admitting: *Deleted

## 2023-05-03 VITALS — BP 126/70 | HR 66 | Temp 97.9°F | Ht 70.0 in | Wt 205.2 lb

## 2023-05-03 DIAGNOSIS — J4540 Moderate persistent asthma, uncomplicated: Secondary | ICD-10-CM

## 2023-05-03 MED ORDER — AIRSUPRA 90-80 MCG/ACT IN AERO: 2.0000 | INHALATION_SPRAY | Freq: Four times a day (QID) | RESPIRATORY_TRACT | 1 refills | Status: DC | PRN

## 2023-05-03 MED ORDER — MONTELUKAST SODIUM 10 MG PO TABS
10.0000 mg | ORAL_TABLET | Freq: Every day | ORAL | 1 refills | Status: DC
Start: 2023-05-03 — End: 2023-08-04

## 2023-05-03 MED ORDER — MONTELUKAST SODIUM 10 MG PO TABS
10.0000 mg | ORAL_TABLET | Freq: Every day | ORAL | 11 refills | Status: DC
Start: 2023-05-03 — End: 2024-03-13

## 2023-05-03 MED ORDER — AIRSUPRA 90-80 MCG/ACT IN AERO: 2.0000 | INHALATION_SPRAY | Freq: Four times a day (QID) | RESPIRATORY_TRACT | 5 refills | Status: DC | PRN

## 2023-05-03 NOTE — Telephone Encounter (Signed)
What medications has patient failed for diagnosis?

## 2023-05-03 NOTE — Patient Instructions (Addendum)
Please schedule follow up scheduled with myself in 3 months.  If my schedule is not open yet, we will contact you with a reminder closer to that time. Please call [phone] if you haven't heard from [place] a month before.   Continue the advair 230 2 puffs twice daily, gargle after use. We are changing your rescue inhaler from albuterol to [place].  This may require prior authorization to get approved form insurance. Continue the nasal sprays, zyrtec, allegra. We are adding montelukast allergy/asthma pill to help   The next step if you're not improved at follow up will be discussion of biologic therapy such as dupixent, tespire, fasenra, nucala.   Have a great trip!   Call me sooner if you need me.

## 2023-05-03 NOTE — Progress Notes (Signed)
Jeffery Underwood    [id number]    Underwood 06, 1957  Primary Care Physician:Wolters, Jasmine December, MD Date of Appointment: 05/03/2023 Established Patient Visit  Chief complaint:   Chief Complaint  Patient presents with   Follow-up    Had PNA 7/5, he is now better.  Has been walking and now back in the [place].     HPI: Jeffery Underwood is a 67 y.o. man with moderate persistent asthma.   Interval Updates: Here for asthma follow up.  In the interm saw urgent care and our office [facility]) for sick visit in [place]. Treated with doxycycline, steroid taper, switched to advair and feels this is better.   In July(last month) he had pneumonia and was treated with abx and steroids. Seen by PCP Jeffery Underwood. Got two courses of antibiotics as well as a round of steroids.   Has been undergoing work up for fatigue with cardiology. Says he is sleep is ok. Wake up in the middle of the night. Naps frequently. He does snore. He does have a CPAP and follows with [facility] - due in September for follow up. Denies excessive stress.    Current Regimen: advair 230 2 puffs BID Asthma Triggers:URIs, seasonal allergies Exacerbations in the last year: 3 courses History of hospitalization or intubation: Allergy Testing: sees Jeffery Underwood in allergy -  GERD: denies Allergic Rhinitis: yes on dymista, zyrtec.  ACT:  Asthma Control Test ACT Total Score  05/03/2023 11:07 AM 25  09/01/2022  9:00 AM 24  07/23/2022  9:07 AM 23   FeNO:  I have reviewed the patient's family social and past medical history and updated as appropriate.   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   Left Ala Nasi (well diff)   Seasonal allergies     Past Surgical History:  Procedure Laterality Date   CARDIOVERSION N/A 08/05/2021   Procedure: CARDIOVERSION;  Surgeon: Jeffery Decamp, MD;  Location: [facility] ENDOSCOPY;  Service: Cardiovascular;  Laterality: N/A;   INGUINAL HERNIA REPAIR     right side (4-5 years ago)   mohs Right     Family History  Problem Relation Age of Onset   Asthma Mother    Depression Mother    Tuberculosis Mother    Osteoporosis Mother    Diabetes type II Father    Atrial fibrillation Father     Social History   Occupational History   Not on file  Tobacco Use   Smoking status: Never   Smokeless tobacco: Never  Vaping Use   Vaping status: Never Used  Substance and Sexual Activity   Alcohol use: Yes    Alcohol/week: 14.0 standard drinks of alcohol    Types: 14 Cans of beer per week    Comment: 3-5 per week   Drug use: No   Sexual activity: Yes     Physical Exam: Blood pressure 126/70, pulse  66, temperature 97.9 F (36.6 C), temperature source Oral, height 5\' 10"  (1.778 m), weight 205 lb 3.2 oz (93.1 kg), SpO2 96%.  Gen:      No acute distress Lungs:    No increased respiratory effort, symmetric chest wall excursion, clear to auscultation bilaterally, no wheezes or crackles CV:         Regular rate and rhythm; no murmurs, rubs, or gallops.  No pedal edema   Data Reviewed: Imaging: I have personally reviewed the CT Cardiac scoring - visualized lung windows are clear  PFTs:      No data to display         I have personally reviewed the patient's PFTs and spirometry Nov 2023 -  moderate airflow limitation  Labs: No peripheral eosinophilia SPT - dust mites, cat Immunization status: Immunization History  Administered Date(s) Administered    Influenza-Unspecified 06/29/2022   Moderna Covid-19 Vaccine Bivalent Booster 34yrs & up 07/27/2021   PFIZER(Purple Top)SARS-COV-2 Vaccination 11/25/2019, 12/16/2019, 06/26/2020, 12/23/2020    External Records Personally Reviewed: allergy, pulmonary  Assessment:  Moderate persistent asthma, not well controlled Chronic allergic rhinitis  Plan/Recommendations: Continue the advair 230 2 puffs twice daily, gargle after use. We are changing your rescue inhaler from albuterol to [place].  This may require prior authorization to get approved form insurance. Continue the nasal sprays, zyrtec, allegra. We are adding montelukast allergy/asthma pill to help   The next step if you're not improved at follow up will be discussion of biologic therapy such as dupixent, tespire, fasenra, nucala.    Return to Care: Return in about 3 months (around 08/03/2023).   Jeffery Salts, MD Pulmonary and Critical Care Medicine [facility] Office:[phone]

## 2023-05-03 NOTE — Telephone Encounter (Signed)
Pharmacy team, please start prior auth for AirSupra.

## 2023-05-07 NOTE — Telephone Encounter (Signed)
Mychart message sent to patient to determine what medications he has tried.

## 2023-05-10 ENCOUNTER — Telehealth: Payer: Self-pay

## 2023-05-10 NOTE — Telephone Encounter (Signed)
*  Pulm  Pharmacy Patient Advocate Encounter   Received notification from CoverMyMeds that prior authorization for Airsupra 90-80MCG/ACT aerosol  is required/requested.   Insurance verification completed.   The patient is insured through [place] .   Per test claim: PA required; PA submitted to [facility] via CoverMyMeds Key/confirmation #/EOC [id number] Status is pending

## 2023-05-10 NOTE — Telephone Encounter (Signed)
Received patient response regarding inhalers he has tried in the past:  I have used numerous versions of albuterol for rescue (for years it was the only thing I used along with benedril), an Advair discus, Symbicort, Advair in the two strengths you have advised. Hope this helps.

## 2023-05-10 NOTE — Telephone Encounter (Signed)
PA request has been Submitted. New Encounter created for follow up. For additional info see Pharmacy Prior Auth telephone encounter from 05/10/2023.

## 2023-05-11 ENCOUNTER — Other Ambulatory Visit (HOSPITAL_COMMUNITY): Payer: Self-pay

## 2023-05-11 NOTE — Telephone Encounter (Signed)
Pharmacy Patient Advocate Encounter  Received notification from [facility] that Prior Authorization for Airsupra 90-80MCG/ACT aerosol has been APPROVED from 05-10-2023 to 09-21-2023. Ran test claim, Copay is $50.00. Insurance shows patient is entering into coverage gap/donut hole. This test claim was processed through [facility]- copay amounts may vary at other pharmacies due to pharmacy/plan contracts, or as the patient moves through the different stages of their insurance plan.   PA #/Case ID/Reference #: [id number]

## 2023-06-10 ENCOUNTER — Encounter: Payer: Self-pay | Admitting: Internal Medicine

## 2023-06-15 ENCOUNTER — Other Ambulatory Visit: Payer: Self-pay

## 2023-06-15 MED ORDER — FLUTICASONE-SALMETEROL 230-21 MCG/ACT IN AERO: 2.0000 | INHALATION_SPRAY | Freq: Two times a day (BID) | RESPIRATORY_TRACT | 3 refills | Status: DC

## 2023-06-18 ENCOUNTER — Other Ambulatory Visit (HOSPITAL_COMMUNITY): Payer: Self-pay

## 2023-06-29 DIAGNOSIS — G4733 Obstructive sleep apnea (adult) (pediatric): Secondary | ICD-10-CM | POA: Diagnosis not present

## 2023-06-30 DIAGNOSIS — D044 Carcinoma in situ of skin of scalp and neck: Secondary | ICD-10-CM | POA: Diagnosis not present

## 2023-06-30 DIAGNOSIS — L578 Other skin changes due to chronic exposure to nonionizing radiation: Secondary | ICD-10-CM | POA: Diagnosis not present

## 2023-06-30 DIAGNOSIS — L570 Actinic keratosis: Secondary | ICD-10-CM | POA: Diagnosis not present

## 2023-06-30 DIAGNOSIS — L820 Inflamed seborrheic keratosis: Secondary | ICD-10-CM | POA: Diagnosis not present

## 2023-06-30 DIAGNOSIS — L821 Other seborrheic keratosis: Secondary | ICD-10-CM | POA: Diagnosis not present

## 2023-06-30 DIAGNOSIS — Z85820 Personal history of malignant melanoma of skin: Secondary | ICD-10-CM | POA: Diagnosis not present

## 2023-06-30 DIAGNOSIS — D0439 Carcinoma in situ of skin of other parts of face: Secondary | ICD-10-CM | POA: Diagnosis not present

## 2023-06-30 DIAGNOSIS — D485 Neoplasm of uncertain behavior of skin: Secondary | ICD-10-CM | POA: Diagnosis not present

## 2023-06-30 DIAGNOSIS — Z85828 Personal history of other malignant neoplasm of skin: Secondary | ICD-10-CM | POA: Diagnosis not present

## 2023-06-30 DIAGNOSIS — L814 Other melanin hyperpigmentation: Secondary | ICD-10-CM | POA: Diagnosis not present

## 2023-06-30 DIAGNOSIS — D1801 Hemangioma of skin and subcutaneous tissue: Secondary | ICD-10-CM | POA: Diagnosis not present

## 2023-07-12 DIAGNOSIS — Z125 Encounter for screening for malignant neoplasm of prostate: Secondary | ICD-10-CM | POA: Diagnosis not present

## 2023-07-12 DIAGNOSIS — R053 Chronic cough: Secondary | ICD-10-CM | POA: Diagnosis not present

## 2023-07-12 DIAGNOSIS — J45901 Unspecified asthma with (acute) exacerbation: Secondary | ICD-10-CM | POA: Diagnosis not present

## 2023-07-12 DIAGNOSIS — Z0000 Encounter for general adult medical examination without abnormal findings: Secondary | ICD-10-CM | POA: Diagnosis not present

## 2023-07-12 DIAGNOSIS — I10 Essential (primary) hypertension: Secondary | ICD-10-CM | POA: Diagnosis not present

## 2023-07-12 DIAGNOSIS — Z87898 Personal history of other specified conditions: Secondary | ICD-10-CM | POA: Diagnosis not present

## 2023-07-12 DIAGNOSIS — R5383 Other fatigue: Secondary | ICD-10-CM | POA: Diagnosis not present

## 2023-07-12 DIAGNOSIS — E781 Pure hyperglyceridemia: Secondary | ICD-10-CM | POA: Diagnosis not present

## 2023-07-12 DIAGNOSIS — Z8639 Personal history of other endocrine, nutritional and metabolic disease: Secondary | ICD-10-CM | POA: Diagnosis not present

## 2023-07-12 DIAGNOSIS — Z79899 Other long term (current) drug therapy: Secondary | ICD-10-CM | POA: Diagnosis not present

## 2023-07-12 LAB — LAB REPORT - SCANNED
A1c: 5.4
EGFR: 71

## 2023-07-21 DIAGNOSIS — G4733 Obstructive sleep apnea (adult) (pediatric): Secondary | ICD-10-CM | POA: Diagnosis not present

## 2023-08-03 DIAGNOSIS — D485 Neoplasm of uncertain behavior of skin: Secondary | ICD-10-CM | POA: Diagnosis not present

## 2023-08-03 DIAGNOSIS — L570 Actinic keratosis: Secondary | ICD-10-CM | POA: Diagnosis not present

## 2023-08-03 DIAGNOSIS — C44619 Basal cell carcinoma of skin of left upper limb, including shoulder: Secondary | ICD-10-CM | POA: Diagnosis not present

## 2023-08-03 DIAGNOSIS — G4733 Obstructive sleep apnea (adult) (pediatric): Secondary | ICD-10-CM | POA: Diagnosis not present

## 2023-08-04 ENCOUNTER — Ambulatory Visit (INDEPENDENT_AMBULATORY_CARE_PROVIDER_SITE_OTHER): Payer: Medicare PPO | Admitting: Internal Medicine

## 2023-08-04 ENCOUNTER — Telehealth: Payer: Self-pay | Admitting: Pharmacist

## 2023-08-04 ENCOUNTER — Encounter: Payer: Self-pay | Admitting: Internal Medicine

## 2023-08-04 VITALS — BP 116/64 | HR 66 | Ht 70.0 in | Wt 205.2 lb

## 2023-08-04 DIAGNOSIS — J4550 Severe persistent asthma, uncomplicated: Secondary | ICD-10-CM | POA: Diagnosis not present

## 2023-08-04 DIAGNOSIS — R053 Chronic cough: Secondary | ICD-10-CM

## 2023-08-04 DIAGNOSIS — J309 Allergic rhinitis, unspecified: Secondary | ICD-10-CM | POA: Diagnosis not present

## 2023-08-04 DIAGNOSIS — Z7952 Long term (current) use of systemic steroids: Secondary | ICD-10-CM

## 2023-08-04 LAB — POCT EXHALED NITRIC OXIDE: FeNO level (ppb): 20

## 2023-08-04 MED ORDER — ALBUTEROL SULFATE HFA 108 (90 BASE) MCG/ACT IN AERS: 2.0000 | INHALATION_SPRAY | RESPIRATORY_TRACT | 5 refills | Status: AC | PRN

## 2023-08-04 NOTE — Progress Notes (Addendum)
Jeffery Underwood    [id number]    12-10-1955  Primary Care Physician:Wolters, Jasmine December, MD Date of Appointment: 08/04/2023 Established Patient Visit  Chief complaint:   Chief Complaint  Patient presents with   Follow-up    Asthma F/U visit. Pt c/o Productive Cough with white sputum since August 2024.      HPI: Jeffery Underwood is a 67 y.o. man with moderate persistent asthma.   Interval Updates: Here for asthma follow up. Started airsupra but didn't feel improvement. He has been taking this as needed without improving.   Prednisone and antibiotics always help his breathing. Albuterol does help. Taking it no more than twice a day. The predominant asthma symptom is cough. He is able to exercise without dyspnea.   Denies reflux. He thinks vinegar is making tightens his throat and makes him cough more.   Has been undergoing work up for fatigue with cardiology. Says he is sleep is ok. Wake up in the middle of the night. Naps frequently. He does snore. He does have a CPAP and follows with [facility] - due in September for follow up. Denies excessive stress.    Current Regimen: advair 230 2 puffs BID Asthma Triggers:URIs, seasonal allergies Exacerbations in the last year:5-6 times since January 2024.  History of hospitalization or intubation: Allergy Testing: sees Dr. Allena Katz in allergy -  GERD: denies Allergic Rhinitis: flonase, zyrtec, singulair ACT:  Asthma Control Test ACT Total Score  08/04/2023  8:36 AM 23  05/03/2023 11:07 AM 25  09/01/2022  9:00 AM 24   FeNO:  I have reviewed the patient's family social and past medical history and updated as appropriate.   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   Left [place] N[place] (well diff)   Seasonal allergies     Past Surgical History:  Procedure Laterality Date   CARDIOVERSION N/A 08/05/2021   Procedure: CARDIOVERSION;  Surgeon: Yates Decamp, MD;  Location: [facility] ENDOSCOPY;  Service: Cardiovascular;  Laterality: N/A;   INGUINAL HERNIA REPAIR     right side (4-5 years ago)   mohs Right     Family History  Problem Relation Age of Onset   Asthma Mother    Depression Mother    Tuberculosis Mother    Osteoporosis Mother    Diabetes type II Father    Atrial fibrillation Father     Social History   Occupational History   Not on file  Tobacco Use   Smoking status: Never   Smokeless tobacco: Never  Vaping Use   Vaping status: Never Used  Substance and Sexual Activity   Alcohol use: Yes    Alcohol/week: 14.0 standard drinks of alcohol    Types: 14 Cans of beer per week    Comment: 3-5 per week   Drug use: No   Sexual activity: Yes     Physical Exam: Blood pressure  116/64, pulse 66, height 5\' 10"  (1.778 m), weight 205 lb 3.2 oz (93.1 kg), SpO2 96%.  Gen:      No acute distress ENT: mild nasal debris, +cobblestoning, no nasal polyps Lungs:    No increased respiratory effort, symmetric chest wall excursion, clear to auscultation bilaterally, no wheezes or crackles CV:         Regular rate and rhythm; no murmurs, rubs, or gallops.  No pedal edema   Data Reviewed: Imaging: I have personally reviewed the CT Cardiac scoring - visualized lung windows are clear  PFTs:      No data to display         I have personally reviewed the patient's PFTs and spirometry Nov 2023 -  moderate airflow limitation  Labs: No peripheral eosinophilia SPT - dust mites, cat Immunization  status: Immunization History  Administered Date(s) Administered   Influenza-Unspecified 06/29/2022   Moderna Covid-19 Vaccine Bivalent Booster 85yrs & up 07/27/2021   PFIZER(Purple Top)SARS-COV-2 Vaccination 11/25/2019, 12/16/2019, 06/26/2020, 12/23/2020    External Records Personally Reviewed: allergy, pulmonary  Assessment:  Moderate persistent asthma, not well controlled dependent on systemic steroids Chronic allergic rhinitis Chronic cough not well controlled (likely multifactorial from above, denies reflux)  Plan/Recommendations: Continue the advair 230 2 puffs twice daily, gargle after use. Continue albuterol as needed.  Continue the flonase, zyrtec, allegra, montelukast  The next step is starting dupixent - our pharmacy team will be in touch you.   Please remember to get your flu shot.   I will refer you to the ENT for your coughing as well.   Feno obtained today 20 ppb  Return to Care: Return in about 3 months (around 11/04/2023).   Durel Salts, MD Pulmonary and Critical Care Medicine [facility] Office:[phone]

## 2023-08-04 NOTE — Patient Instructions (Addendum)
It was a pleasure to see you today!  Please schedule follow up scheduled with myself in 3 months.  If my schedule is not open yet, we will contact you with a reminder closer to that time. Please call [phone] if you haven't heard from [place] a month before, and always call us sooner if issues or concerns arise. You can also send [place] a message through MyChart, but but aware that this is not to be used for urgent issues and it may take up to 5-7 days to receive a reply. Please be aware that you will likely be able to view your results before I have a chance to respond to them. Please give [place] 5 business days to respond to any non-urgent results.    Continue the advair 230 2 puffs twice daily, gargle after use. Continue albuterol as needed.  Continue the flonase, zyrtec, allegra, montelukast  The next step is starting dupixent - our pharmacy team will be in touch you.   Please remember to get your flu shot.  I will refer you to the ENT for your coughing as well.

## 2023-08-04 NOTE — Telephone Encounter (Addendum)
Submitted a Prior Authorization request to [facility] for DUPIXENT via CoverMyMeds. Will update once we receive a response.  Key: [id number]  ----- Message ----- From: Charlott Holler, MD Sent: 08/04/2023  10:00 AM EST To: Murrell Redden, RPH-CPP  New start dupixent

## 2023-08-05 DIAGNOSIS — C44329 Squamous cell carcinoma of skin of other parts of face: Secondary | ICD-10-CM | POA: Diagnosis not present

## 2023-08-09 ENCOUNTER — Ambulatory Visit: Payer: Medicare PPO | Attending: Cardiology | Admitting: Cardiology

## 2023-08-09 ENCOUNTER — Other Ambulatory Visit: Payer: Self-pay

## 2023-08-09 ENCOUNTER — Encounter: Payer: Self-pay | Admitting: Cardiology

## 2023-08-09 VITALS — BP 110/70 | HR 55 | Ht 70.0 in | Wt 202.0 lb

## 2023-08-09 DIAGNOSIS — E782 Mixed hyperlipidemia: Secondary | ICD-10-CM | POA: Diagnosis not present

## 2023-08-09 DIAGNOSIS — R931 Abnormal findings on diagnostic imaging of heart and coronary circulation: Secondary | ICD-10-CM | POA: Diagnosis not present

## 2023-08-09 DIAGNOSIS — I4819 Other persistent atrial fibrillation: Secondary | ICD-10-CM | POA: Diagnosis not present

## 2023-08-09 DIAGNOSIS — I10 Essential (primary) hypertension: Secondary | ICD-10-CM

## 2023-08-09 DIAGNOSIS — I484 Atypical atrial flutter: Secondary | ICD-10-CM | POA: Diagnosis not present

## 2023-08-09 MED ORDER — ROSUVASTATIN CALCIUM 10 MG PO TABS: 10.0000 mg | ORAL_TABLET | Freq: Every day | ORAL | 3 refills | Status: DC

## 2023-08-09 MED ORDER — APIXABAN 5 MG PO TABS: 5.0000 mg | ORAL_TABLET | Freq: Two times a day (BID) | ORAL | 1 refills | Status: DC

## 2023-08-09 NOTE — Telephone Encounter (Signed)
Prescription refill request for Eliquis received. Indication: Afib  Last office visit: 02/12/23 (Patwardhan)  Scr: 1.18 (06/21/22)  Age: 67  Weight: 91.6kg  Labs overdue. Per PCP, labs were drawn within the past year. Requested labs to be faxed to anticoagulation clinic.

## 2023-08-09 NOTE — Telephone Encounter (Signed)
Received labs via faz from PCP.  Scr 1.13 on 07/12/23.   Appropriate dose. Refill sent.

## 2023-08-09 NOTE — Telephone Encounter (Signed)
Received fax from [employer] stating that "insurance information" is missing. Presumably the new start paperwork was faxed directly to [employer] rather than being put into the pharmacy's mailbox so that it could be submitted properly with all accompanying documents and information. Will need to submit once BIV has been completed.

## 2023-08-09 NOTE — Patient Instructions (Signed)
Medication Instructions:   STOP TAKING METOPROLOL NOW  STOP TAKING LISINOPRIL NOW  *If you need a refill on your cardiac medications before your next appointment, please call your pharmacy*    Follow-Up:  1.)  6 MONTHS WITH AN EXTENDER IN THE OFFICE  2.)  ONE YEAR WITH DR. PATWARDHAN

## 2023-08-09 NOTE — Progress Notes (Signed)
Cardiology Office Note:  .   Date:  08/09/2023  ID:  Jeffery Underwood, DOB 06-20-1956, MRN [id number] PCP: Jeffery Palmer, MD  [facility] Providers Cardiologist:  Jeffery Mainland, MD PCP: Jeffery Palmer, MD  Chief Complaint  Patient presents with   Atrial Fibrillation      History of Present Illness: .    Jeffery Underwood is a 67 y.o. male with hypertension,OSA on CPAP, h/o persistent atypical atrial flutter/fibrillation.  Patient denies any significant chest pain or shortness of breath symptoms.  He has noticed productive cough, due to lisinopril.  Seeing pulmonologist Jeffery Underwood for the same.  Separate note, has had multiple skin surgeries done recently.    Vitals:   08/09/23 0903  BP: 110/70  Pulse: (!) 55     ROS:  Review of Systems  Cardiovascular:  Negative for chest pain, dyspnea on exertion, leg swelling, palpitations and syncope.     Studies Reviewed: Marland Kitchen       EKG 08/10/2023: Sinus rhythm with 1st degree A-V block When compared with ECG of 21-Jun-2022 16:02, PREVIOUS ECG IS PRESENT  Labs 06/2023: Chol 129, TG 112, HDL 42, LDL 59 (06/2023)  Exercise nuclear stress test 02/04/2023: Myocardial perfusion is normal. Overall LV systolic function is normal without regional wall motion abnormalities. Stress LV EF: 68%. Low risk study. Normal ECG stress. Peak EKG/ECG demonstrated sinus tachycardia. The patient exercised for 7 minutes and 59 seconds of a Bruce protocol, achieving approximately 10.13 METs and 87% MPHR.  The heart rate response was normal. The blood pressure response was normal. No previous exam available for comparison.   Echocardiogram 01/29/2023:  Normal LV systolic function with visual EF 55-60%. Left ventricle cavity  is normal in size. Normal left ventricular wall thickness. Normal global  wall motion. Normal diastolic filling pattern, normal LAP. Calculated EF  53%.  Left atrial cavity is mildly dilated.  Structurally normal  tricuspid valve with no regurgitation. No evidence of  pulmonary hypertension.  No significant change compared to 06/2021.    Risk Assessment/Calculations:    CHA2DS2-VASc Score = 2   This indicates a 2.2 % annual risk of stroke. The patient's score is based upon:        Physical Exam:   Physical Exam Vitals and nursing note reviewed.  Constitutional:      General: He is not in acute distress. Neck:     Vascular: No JVD.  Cardiovascular:     Rate and Rhythm: Normal rate and regular rhythm.     Heart sounds: Normal heart sounds. No murmur heard. Pulmonary:     Effort: Pulmonary effort is normal.     Breath sounds: Normal breath sounds. No wheezing or rales.  Musculoskeletal:     Right lower leg: No edema.     Left lower leg: No edema.      VISIT DIAGNOSES:   ICD-10-CM   1. Elevated coronary artery calcium score  R93.1 EKG 12-Lead    2. Atypical atrial flutter (HCC)  I48.4     3. Persistent atrial fibrillation (HCC)  I48.19     4. Primary hypertension  I10     5. Mixed hyperlipidemia  E78.2        ASSESSMENT AND PLAN: .    Jeffery Underwood is a 67 y.o. male with hypertension,OSA on CPAP, h/o persistent atypical atrial flutter/fibrillation.   Decreased exercise tolerance: Reassuring echocardiogram and stress test.  No recent A-fib recurrence.  My suspicion for cardiac etiology is low.  Cough:  In addition to multiple other factors, lisinopril could be a possible contributor.  Stop lisinopril 5 mg.  Elevated calcium score: Total: 114  Percentile: 52nd, none in left main Chol 172, TG 129, HDL 42, LDL 107 (12/2022) Chol 129, TG 112, HDL 42, LDL 59 (06/2023) In addition to heart healthy diet and lifestyle, continue Crestor 10 mg daily.     H/o persistent Afib/flutter: In sinus rhythm today. Encouraged to use smart watch regularly. CHA2DS2VASc score 2, annual stroke risk 2,3% Continue Eliquis 5 mg bid.   Hypertension: Stopped lisinopril 5 mg daily, as well as  metoprolol tartrate 12.5 mg twice daily, which is a very low dose.  Anticipate his blood pressure to remain under 130/80 mmHg.  If he notices blood pressure consistently >130/80 mmHg, or heart rate >80 bpm, we could consider adding losartan 25 mg and/or increase diltiazem to 240 mg daily.      Meds ordered this encounter  Medications   rosuvastatin (CRESTOR) 10 MG tablet    Sig: Take 1 tablet (10 mg total) by mouth daily.    Dispense:  90 tablet    Refill:  3     F/u in 6 months w/APP to assess HR and BP off lisinopril and metoprolol. F/u in 1 year w/me after that  Signed, Jeffery Negus, MD

## 2023-08-11 ENCOUNTER — Other Ambulatory Visit (HOSPITAL_COMMUNITY): Payer: Self-pay

## 2023-08-11 NOTE — Telephone Encounter (Signed)
Received notification from [facility] regarding a prior authorization for DUPIXENT. Authorization has been APPROVED from 08/04/23 to 09/20/24. Approval letter sent to scan center.  Per test claim, copay for 28 days supply is $50  Patient can fill through [facility]: [phone]   Phone # [phone]  ATC patient to determine if rx is affordable for patient. Unable to reach. Left VM with my direct callback number  Chesley Mires, PharmD, MPH, BCPS, CPP Clinical Pharmacist (Rheumatology and Pulmonology)

## 2023-08-12 ENCOUNTER — Encounter (INDEPENDENT_AMBULATORY_CARE_PROVIDER_SITE_OTHER): Payer: Self-pay | Admitting: Otolaryngology

## 2023-08-12 NOTE — Telephone Encounter (Signed)
Returned call to patient regarding Dupixent.  He states that he can afford the $50 copay  Patient scheduled for Dupixent new start on 08/16/23 @ 1:20pm. Will use sample  Chesley Mires, PharmD, MPH, BCPS, CPP Clinical Pharmacist (Rheumatology and Pulmonology)

## 2023-08-13 NOTE — Progress Notes (Unsigned)
HPI Patient presents today to [facility] to see pharmacy team for Dupixent new start.  Past medical history includes ***  Number of hospitalizations in past year: Number of ***COPD/asthma exacerbations in past year:   Respiratory Medications Current regimen: *** Tried in past: *** Patient reports {Adherence challenges yes no:3044014::"adherence challenges","no known adherence challenges"}  OBJECTIVE No Known Allergies  Outpatient Encounter Medications as of 08/16/2023  Medication Sig   albuterol (VENTOLIN HFA) 108 (90 Base) MCG/ACT inhaler Inhale 2 puffs into the lungs as needed for wheezing or shortness of breath.   Albuterol-Budesonide (AIRSUPRA) 90-80 MCG/ACT AERO Inhale 2 puffs into the lungs every 6 (six) hours as needed.   apixaban (ELIQUIS) 5 MG TABS tablet Take 1 tablet (5 mg total) by mouth 2 (two) times daily.   azelastine (ASTELIN) 0.1 % nasal spray Place 1 spray into both nostrils 2 (two) times daily. Use in each nostril as directed   azelastine (OPTIVAR) 0.05 % ophthalmic solution Apply 1 drop to eye 2 (two) times daily as needed (itchy watery eyes).   b complex vitamins capsule Take 1 capsule by mouth daily.   buPROPion (WELLBUTRIN XL) 300 MG 24 hr tablet Take 300 mg by mouth in the morning.   cetirizine (ZYRTEC) 10 MG tablet Take 1 tablet (10 mg total) by mouth daily.   diltiazem (CARDIZEM CD) 180 MG 24 hr capsule TAKE 1 CAPSULE EVERY DAY   FLUoxetine (PROZAC) 20 MG capsule Take 20 mg by mouth daily.   fluticasone (FLONASE) 50 MCG/ACT nasal spray Place 2 sprays into both nostrils daily.   fluticasone-salmeterol (ADVAIR HFA) 230-21 MCG/ACT inhaler Inhale 2 puffs into the lungs 2 (two) times daily.   montelukast (SINGULAIR) 10 MG tablet Take 1 tablet (10 mg total) by mouth at bedtime.   Multiple Vitamins-Minerals (MULTIVITAMIN PO) Take 1 tablet by mouth at bedtime.   Respiratory Therapy Supplies (CARETOUCH 2 CPAP HOSE HANGER) MISC See admin instructions.    rosuvastatin (CRESTOR) 10 MG tablet Take 1 tablet (10 mg total) by mouth daily.   vitamin E 1000 UNIT capsule Take 1,000 Units by mouth at bedtime.   No facility-administered encounter medications on file as of 08/16/2023.     Immunization History  Administered Date(s) Administered   Influenza-Unspecified 06/29/2022   Moderna Covid-19 Vaccine Bivalent Booster 51yrs & up 07/27/2021   PFIZER(Purple Top)SARS-COV-2 Vaccination 11/25/2019, 12/16/2019, 06/26/2020, 12/23/2020     PFTs     No data to display           Eosinophils Most recent blood eosinophil count was *** cells/microL taken on ***.   IgE: *** on ***   Assessment   Biologics training for dupilumab (Dupixent)  Goals of therapy: Mechanism: human monoclonal IgG4 antibody that inhibits interleukin-4 and interleukin-13 cytokine-induced responses, including release of proinflammatory cytokines, chemokines, and IgE Reviewed that Dupixent is add-on medication and patient must continue maintenance inhaler regimen. Response to therapy: may take 4 months to determine efficacy. Discussed that patients generally feel improvement sooner than 4 months.  Side effects: injection site reaction (6-18%), antibody development (5-16%), ophthalmic conjunctivitis (2-16%), transient blood eosinophilia (1-2%)  Dose: {dupixentdose:25708}  Administration/Storage:  Reviewed administration sites of thigh or abdomen (at least 2-3 inches away from abdomen). Reviewed the upper arm is only appropriate if caregiver is administering injection  Do not shake pen/syringe as this could lead to product foaming or precipitation. Do not use if solution is discolored or contains particulate matter or if window on prefilled pen is yellow (indicates pen  has been used).  Reviewed storage of medication in refrigerator. Reviewed that Dupixent can be stored at room temperature in unopened carton for up to 14 days.  Access: Approval of Dupixent through:  insurance  Patient self-administered Dupixent 300mg /58ml x 2 (total dose 600mg ) in {injsitedsg:28167} and {injsitedsg:28167} using sample Dupixent 300mg /2mL autoinjector pen NDC: *** Lot: *** Expiration: ***  Patient monitored for 30 minutes for adverse reaction.  Patient tolerated ***.  Injection site noted. {injectionreaction:30756}  Medication Reconciliation  A drug regimen assessment was performed, including review of allergies, interactions, disease-state management, dosing and immunization history. Medications were reviewed with the patient, including name, instructions, indication, goals of therapy, potential side effects, importance of adherence, and safe use.  Drug interaction(s): ***  Immunizations  Patient is indicated for the influenzae, pneumonia, and shingles vaccinations. Patient has received *** COVID19 vaccines.   PLAN Continue Dupixent 300mg  every 14 days.  Next dose is due 08/30/2023 and every 14 days thereafter. Rx sent to: [facility]: [phone] .  Patient provided with pharmacy phone number and advised to call later this week to schedule shipment to home.  Continue maintenance asthma regimen of: ***  All questions encouraged and answered.  Instructed patient to reach out with any further questions or concerns.  Thank you for allowing pharmacy to participate in this patient's care.  This appointment required *** minutes of patient care (this includes precharting, chart review, review of results, face-to-face care, etc.).

## 2023-08-13 NOTE — Patient Instructions (Incomplete)
Your next Dupixent dose is due on 08/30/23, 09/13/23, and every 14 days thereafter  CONTINUE Advair 2 puffs twice daily and montelukast 10mg  nightly  Your prescription will be shipped from [facility]. Their phone number is [phone]. Mills Koller will call to schedule the first shipment and confirm address. They will mail your medication to your home.  You will need to be seen by your provider in 3 to 4 months to assess how Dupixent is working for you. Please ensure you keep your follow-up appointment scheduled in February 2024. Call our clinic if you need to make this appointment.  Stay up to date on all routine vaccines: influenza, pneumonia, COVID19, Shingles  How to manage an injection site reaction: Remember the 5 C's: COUNTER - leave on the counter at least 30 minutes but up to overnight to bring medication to room temperature. This may help prevent stinging COLD - place something cold (like an ice gel pack or cold water bottle) on the injection site just before cleansing with alcohol. This may help reduce pain CLARITIN - use Claritin (generic name is loratadine) for the first two weeks of treatment or the day of, the day before, and the day after injecting. This will help to minimize injection site reactions CORTISONE CREAM - apply if injection site is irritated and itching CALL ME - if injection site reaction is bigger than the size of your fist, looks infected, blisters, or if you develop hives

## 2023-08-16 ENCOUNTER — Ambulatory Visit: Payer: Medicare PPO | Admitting: Pharmacist

## 2023-08-16 ENCOUNTER — Other Ambulatory Visit: Payer: Self-pay | Admitting: Pharmacist

## 2023-08-16 DIAGNOSIS — J4550 Severe persistent asthma, uncomplicated: Secondary | ICD-10-CM

## 2023-08-16 DIAGNOSIS — Z7189 Other specified counseling: Secondary | ICD-10-CM

## 2023-08-16 MED ORDER — DUPILUMAB 300 MG/2ML ~~LOC~~ SOAJ
300.0000 mg | SUBCUTANEOUS | 1 refills | Status: DC
  Filled 2023-08-25: qty 4, 28d supply, fill #0
  Filled 2023-09-10: qty 4, 28d supply, fill #1
  Filled 2023-10-14: qty 4, 28d supply, fill #2
  Filled 2023-11-19: qty 4, 28d supply, fill #3
  Filled 2023-12-14: qty 4, 28d supply, fill #4
  Filled 2024-01-06 (×2): qty 4, 28d supply, fill #5

## 2023-08-16 NOTE — Progress Notes (Signed)
Patient completed Dupixent loading dose in clinic with sample. Tolerated well despite being slightly reluctant to inject (has had previous bad experience with heparin injections during childhood). No reaction.  Will continue Dupixent 300mg  SQ every 14 days for steroid-dependent asthma.  Has f/u with Dr. Celine Mans scheduled in February 2025  Chesley Mires, PharmD, MPH, BCPS, CPP Clinical Pharmacist (Rheumatology and Pulmonology)

## 2023-08-24 DIAGNOSIS — D0439 Carcinoma in situ of skin of other parts of face: Secondary | ICD-10-CM | POA: Diagnosis not present

## 2023-08-25 ENCOUNTER — Other Ambulatory Visit: Payer: Self-pay

## 2023-08-25 ENCOUNTER — Other Ambulatory Visit (HOSPITAL_COMMUNITY): Payer: Self-pay

## 2023-08-25 NOTE — Progress Notes (Signed)
Specialty Pharmacy Initial Fill Coordination Note  Qaadir Quam is a 67 y.o. male contacted today regarding initial fill of specialty medication(s) Dupilumab   Patient requested Delivery   Delivery date: 08/27/23   Verified address: [address]   [place] [address]   Medication will be filled on 08/26/23.   Patient is aware of $50 copayment.

## 2023-08-26 ENCOUNTER — Other Ambulatory Visit: Payer: Self-pay

## 2023-08-26 ENCOUNTER — Telehealth: Payer: Self-pay | Admitting: Pharmacist

## 2023-08-26 NOTE — Telephone Encounter (Signed)
Received message from [place] that patient reports possible joint pain after first Dupixent dose. He reports this started about 24-36 hours after dose but was not debiilitating. Took acetaminophen sparingly to help manage.  He will still plan to take second dose of Dupixent but is aware to notify our clinic if significant pain with doses.  Chesley Mires, PharmD, MPH, BCPS, CPP Clinical Pharmacist (Rheumatology and Pulmonology)

## 2023-09-09 ENCOUNTER — Other Ambulatory Visit: Payer: Self-pay

## 2023-09-09 ENCOUNTER — Other Ambulatory Visit (HOSPITAL_COMMUNITY): Payer: Self-pay

## 2023-09-10 ENCOUNTER — Other Ambulatory Visit: Payer: Self-pay

## 2023-09-10 ENCOUNTER — Other Ambulatory Visit: Payer: Self-pay | Admitting: Internal Medicine

## 2023-09-10 NOTE — Progress Notes (Signed)
Specialty Pharmacy Refill Coordination Note  Jeffery Underwood is a 67 y.o. male contacted today regarding refills of specialty medication(s) Dupilumab   Patient requested Delivery   Delivery date: 09/21/23   Verified address: [address]  [place] [address]   Medication will be filled on 09/20/23.

## 2023-09-20 ENCOUNTER — Encounter (INDEPENDENT_AMBULATORY_CARE_PROVIDER_SITE_OTHER): Payer: Self-pay | Admitting: Otolaryngology

## 2023-09-20 ENCOUNTER — Ambulatory Visit (INDEPENDENT_AMBULATORY_CARE_PROVIDER_SITE_OTHER): Payer: Medicare PPO | Admitting: Otolaryngology

## 2023-09-20 ENCOUNTER — Other Ambulatory Visit: Payer: Self-pay

## 2023-09-20 VITALS — BP 132/77 | HR 78 | Ht 70.0 in | Wt 204.0 lb

## 2023-09-20 DIAGNOSIS — J4540 Moderate persistent asthma, uncomplicated: Secondary | ICD-10-CM | POA: Diagnosis not present

## 2023-09-20 DIAGNOSIS — R053 Chronic cough: Secondary | ICD-10-CM

## 2023-09-20 DIAGNOSIS — J40 Bronchitis, not specified as acute or chronic: Secondary | ICD-10-CM | POA: Diagnosis not present

## 2023-09-20 DIAGNOSIS — R0982 Postnasal drip: Secondary | ICD-10-CM | POA: Diagnosis not present

## 2023-09-20 DIAGNOSIS — J3089 Other allergic rhinitis: Secondary | ICD-10-CM

## 2023-09-20 DIAGNOSIS — G4733 Obstructive sleep apnea (adult) (pediatric): Secondary | ICD-10-CM

## 2023-09-20 DIAGNOSIS — R0683 Snoring: Secondary | ICD-10-CM | POA: Diagnosis not present

## 2023-09-20 DIAGNOSIS — J189 Pneumonia, unspecified organism: Secondary | ICD-10-CM

## 2023-09-20 MED ORDER — AMOXICILLIN-POT CLAVULANATE 875-125 MG PO TABS: 1.0000 | ORAL_TABLET | Freq: Two times a day (BID) | ORAL | 0 refills | Status: DC

## 2023-09-20 MED ORDER — METHYLPREDNISOLONE 4 MG PO TBPK: ORAL_TABLET | ORAL | 1 refills | Status: DC

## 2023-09-20 NOTE — Patient Instructions (Signed)
-   schedule esophagram - take Augmentin and Medrol pack then schedule CXR  - return in a few weeks for repeat exam

## 2023-09-20 NOTE — Progress Notes (Signed)
ENT CONSULT:  Reason for Consult: chronic cough    HPI: Discussed the use of AI scribe software for clinical note transcription with the patient, who gave verbal consent to proceed.  History of Present Illness   The patient is a 1 yoM, with a history of asthma, recurrent pneumonia, presents with a chronic, productive cough that has been ongoing for approximately two and a half years. The cough was first noticed after spreading wood chips in the yard, and has since worsened. The patient has had multiple episodes of pneumonia, diagnosed primarily through imaging, since the onset of the cough. The cough is described as occurring approximately once an hour, but not during the night.  In the past two weeks, the patient has noticed an increase in the frequency of the cough, now occurring once an hour. Accompanying symptoms include a sensation of something in the back of the throat and occasional nasal discharge. The patient also reports feeling unwell for the past three days, attributing this to a possible cold. He spent time with his grandchildren and suspects that they had a cold.   The patient has a history of sleep apnea and uses a CPAP machine nightly. He reports no significant issues with postnasal drainage or nasal breathing. The cough is always productive, with a tan-colored sputum. The patient denies shortness of breath but reports a sensation of not getting enough air, particularly during exercise.  The patient has a history of skin cancer with multiple lesions on the face. He has no history of smoking. Allergy testing conducted a year ago revealed sensitivities to molds. The patient is currently on Flonase and takes Zyrtec and Singulair daily for allergies. He also recently started Dupixent for persistent asthma.  The patient denies any significant swallowing difficulties, although he does report occasional choking on vinegary foods and when eating vegetables too quickly. He is currently on  Eliquis for atrial fibrillation.     Records Reviewed:  Office notes by Dr Celine Mans 08/04/23 and 05/03/23 Here for asthma follow up.  In the interm saw urgent care and our office [facility]) for sick visit in December-jan. Treated with doxycycline, steroid taper, switched to advair and feels this is better.    In July(last month) he had pneumonia and was treated with abx and steroids. Seen by PCP Dr. Chanetta Marshall. Got two courses of antibiotics as well as a round of steroids.    Has been undergoing work up for fatigue with cardiology. Says he is sleep is ok. Wake up in the middle of the night. Naps frequently. He does snore. He does have a CPAP and follows with [facility] - due in September for follow up. Denies excessive stress.    Current Regimen: advair 230 2 puffs BID Asthma Triggers:URIs, seasonal allergies Exacerbations in the last year: 3 courses History of hospitalization or intubation: Allergy Testing: sees Dr. Allena Katz in allergy -  GERD: denies Allergic Rhinitis: yes on dymista, zyrtec.  Labs: No peripheral eosinophilia SPT - dust mites, cat  Assessment:  Moderate persistent asthma, not well controlled Chronic allergic rhinitis   Plan/Recommendations: Continue the advair 230 2 puffs twice daily, gargle after use. We are changing your rescue inhaler from albuterol to [place].  This may require prior authorization to get approved form insurance. Continue the nasal sprays, zyrtec, allegra. We are adding montelukast allergy/asthma pill to help    The next step if you're not improved at follow up will be discussion of biologic therapy such as dupixent, tespire, fasenra, nucala.  Visit 08/04/23 Plan: Continue the advair 230 2 puffs twice daily, gargle after use. Continue albuterol as needed.  Continue the flonase, zyrtec, allegra, montelukast   The next step is starting dupixent - our pharmacy team will be in touch you.    Please remember to get your flu shot.    I will refer you to  the ENT for your coughing as well.    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 He has never used smokeless tobacco. He reports current alcohol use of about 14.0 standard drinks of alcohol per week. He reports that he does not use drugs.  Allergies: No Known Allergies  Medications: I have reviewed the patient's current medications.  The PMH, PSH, Medications, Allergies, and SH were reviewed and updated.  ROS: Constitutional: Negative for fever, weight loss and weight gain. Cardiovascular: Negative for chest pain and dyspnea on exertion. Respiratory: Is not experiencing shortness of breath at rest. Gastrointestinal: Negative for nausea and vomiting. Neurological: Negative for headaches. Psychiatric: The patient is not nervous/anxious  Blood  pressure 132/77, pulse 78, height 5\' 10"  (1.778 m), weight 204 lb (92.5 kg), SpO2 93%.  PHYSICAL EXAM:  Exam: General: Well-developed, well-nourished Communication and Voice: Clear pitch and clarity Respiratory Respiratory effort: Equal inspiration and expiration without stridor Cardiovascular Peripheral Vascular: Warm extremities with equal color/perfusion Eyes: No nystagmus with equal extraocular motion bilaterally Neuro/Psych/Balance: Patient oriented to person, place, and time; Appropriate mood and affect; Gait is intact with no imbalance; Cranial nerves I-XII are intact Head and Face Inspection: Normocephalic and atraumatic without mass or lesion Palpation: Facial skeleton intact without bony stepoffs Salivary Glands: No mass or tenderness Facial Strength: Facial motility symmetric and full bilaterally ENT Pinna: External ear intact and fully developed External canal: Canal is patent with intact skin Tympanic Membrane: Clear and mobile External Nose: No scar or anatomic deformity Internal Nose: Septum is deviated with S-shaped septum, left side appears more patent. No polyp, or purulence. Mucosal edema and erythema present.  Bilateral inferior turbinate hypertrophy. Scant thick mucus along middle meatus  Lips, Teeth, and gums: Mucosa and teeth intact and viable TMJ: No pain to  palpation with full mobility Oral cavity/oropharynx: No erythema or exudate, no lesions present Nasopharynx: No mass or lesion with intact mucosa Hypopharynx: Intact mucosa without pooling of secretions Larynx Glottic: Full true vocal cord mobility without lesion or mass vocal fold atrophy present  Supraglottic: Normal appearing epiglottis and AE folds Interarytenoid Space: Moderate pachydermia&edema Subglottic Space: Patent without lesion or edema but evidence of thick mucus seen in subglottis and proximal trachea Neck Neck and Trachea: Midline trachea without mass or lesion Thyroid: No mass or  nodularity Lymphatics: No lymphadenopathy  Procedure:  Preoperative diagnosis: chronic cough   Postoperative diagnosis:   Same  Procedure: Flexible fiberoptic laryngoscopy  Surgeon: Ashok Croon, MD  Anesthesia: Topical lidocaine and Afrin Complications: None Condition is stable throughout exam  Indications and consent:  The patient presents to the clinic with Indirect laryngoscopy view was incomplete. Thus it was recommended that they undergo a flexible fiberoptic laryngoscopy. All of the risks, benefits, and potential complications were reviewed with the patient preoperatively and verbal informed consent was obtained.  Procedure: The patient was seated upright in the clinic. Topical lidocaine and Afrin were applied to the nasal cavity. After adequate anesthesia had occurred, I then proceeded to pass the flexible telescope into the nasal cavity. The nasal cavity was patent without rhinorrhea or polyp. The nasopharynx was also patent without mass or lesion. The base of tongue was visualized and was normal. There were no signs of pooling of secretions in the piriform sinuses. The true vocal folds were mobile bilaterally. There were no signs of glottic or supraglottic mucosal lesion or mass. There was moderate interarytenoid pachydermia and post cricoid edema. The telescope was then slowly withdrawn and the patient tolerated the procedure throughout.    PROCEDURE NOTE: nasal endoscopy  Preoperative diagnosis: chronic sinusitis symptoms  Postoperative diagnosis: same  Procedure: Diagnostic nasal endoscopy (25366)  Surgeon: Ashok Croon, M.D.  Anesthesia: Topical lidocaine and Afrin  H&P REVIEW: The patient's history and physical were reviewed today prior to procedure. All medications were reviewed and updated as well. Complications: None Condition is stable throughout exam Indications and consent: The patient presents with symptoms of chronic sinusitis not responding to  previous therapies. All the risks, benefits, and potential complications were reviewed with the patient preoperatively and informed consent was obtained. The time out was completed with confirmation of the correct procedure.   Procedure: The patient was seated upright in the clinic. Topical lidocaine and Afrin were applied to the nasal cavity. After adequate anesthesia had occurred, the rigid nasal endoscope was passed into the nasal cavity. The nasal mucosa, turbinates, septum, and sinus drainage pathways were visualized bilaterally. This revealed minimal purulence left side along middle meatus. There were no polyps or sites of significant inflammation. The mucosa was intact and there was no crusting present. The scope was then slowly withdrawn and the patient tolerated the procedure well. There were no complications or blood loss.  Studies Reviewed: CXR 07/23/2022 FINDINGS: Normal sized heart. Significantly improved density at the medial right lung base with residual curvy linear density. The remainder of the lungs are clear. Thoracic spine degenerative changes.   IMPRESSION: Significantly improved right basilar pneumonia with mild residual atelectasis or scarring.   Assessment/Plan: Encounter Diagnoses  Name Primary?   Snoring    Chronic cough Yes   OSA (obstructive sleep apnea)    Moderate persistent asthma without complication    Bronchitis    Recurrent pneumonia    Environmental and seasonal allergies    Post-nasal drip  Assessment and Plan    Chronic Cough Chronic productive cough for over two years, exacerbated by recent cold symptoms. History of recurrent pneumonia, asthma, and potential upper airway sensitivity. Recent exacerbation with increased frequency of cough and yellow mucus observed, mucus present below vocal cords on scope exam today unclear if the source is post-nasal drainage or pulmonary source. Differential includes recurrent pneumonia, postnasal drip, and  potential aspiration. Discussed risks of untreated infection and benefits of antibiotic and steroid treatment. Explained that Z-Pak is not the best choice for recurrent pneumonia due to limited antibacterial effect. - Prescribed Augmentin for 10 days - Prescribed Medrol pack for 6 days - Ordered chest x-ray tp be done after a course of abx/steroids - Ordered esophagram to rule out aspiration 2/2 evidence of VF atrophy and hx of recurrent PNA - Follow-up visit for videostrobe after imaging   Recurrent Pneumonia Multiple episodes over the past two years, with potential episodes in July and October. Current symptoms and scope findings suggest possible ongoing vs recurrent infection. Discussed need for chest x-ray after abx/steroids to evaluate for persistent infection and potential need for bronchoscopy with lavage if recurrent pneumonia persists. - Treat with Augmentin and Medrol pack - Order chest x-ray post-treatment - Consider bronchoscopy with lavage if recurrent pneumonia persists  - consider CT chest if evidence of PNA or abnormal findings on CXR  Asthma Recent exacerbations, currently managed with Dupixent started one month ago. Discussed potential benefits of Dupixent in controlling asthma symptoms. - Continue Dupixent as prescribed - Monitor asthma control and reassess during follow-up - see Pulm as scheduled   Allergic Rhinitis Positive intradermal testing for molds. Managed with Flonase, Zyrtec, and Singulair. Discussed role of these medications in managing symptoms and preventing exacerbations. - Continue Flonase, Zyrtec, and Singulair as prescribed  Sleep Apnea Managed with CPAP. No recent issues with CPAP use. Discussed importance of continued CPAP use for symptom management. - Continue CPAP use as prescribed  Follow-up - Schedule follow-up visit in two months  - Order chest x-ray  - Order esophagram to rule out aspiration - Videostrobe when he returns     Thank you for  allowing me to participate in the care of this patient. Please do not hesitate to contact me with any questions or concerns.   Ashok Croon, MD Otolaryngology [facility] Phone: [phone] Fax: [phone]  09/20/2023, 9:16 PM

## 2023-09-21 DIAGNOSIS — C4442 Squamous cell carcinoma of skin of scalp and neck: Secondary | ICD-10-CM | POA: Diagnosis not present

## 2023-09-24 DIAGNOSIS — H1032 Unspecified acute conjunctivitis, left eye: Secondary | ICD-10-CM | POA: Diagnosis not present

## 2023-09-24 DIAGNOSIS — I484 Atypical atrial flutter: Secondary | ICD-10-CM | POA: Diagnosis not present

## 2023-09-24 DIAGNOSIS — M25562 Pain in left knee: Secondary | ICD-10-CM | POA: Diagnosis not present

## 2023-09-24 DIAGNOSIS — R053 Chronic cough: Secondary | ICD-10-CM | POA: Diagnosis not present

## 2023-10-12 DIAGNOSIS — L814 Other melanin hyperpigmentation: Secondary | ICD-10-CM | POA: Diagnosis not present

## 2023-10-12 DIAGNOSIS — L570 Actinic keratosis: Secondary | ICD-10-CM | POA: Diagnosis not present

## 2023-10-12 DIAGNOSIS — D0461 Carcinoma in situ of skin of right upper limb, including shoulder: Secondary | ICD-10-CM | POA: Diagnosis not present

## 2023-10-12 DIAGNOSIS — L821 Other seborrheic keratosis: Secondary | ICD-10-CM | POA: Diagnosis not present

## 2023-10-12 DIAGNOSIS — L219 Seborrheic dermatitis, unspecified: Secondary | ICD-10-CM | POA: Diagnosis not present

## 2023-10-12 DIAGNOSIS — L578 Other skin changes due to chronic exposure to nonionizing radiation: Secondary | ICD-10-CM | POA: Diagnosis not present

## 2023-10-12 DIAGNOSIS — C44612 Basal cell carcinoma of skin of right upper limb, including shoulder: Secondary | ICD-10-CM | POA: Diagnosis not present

## 2023-10-12 DIAGNOSIS — D485 Neoplasm of uncertain behavior of skin: Secondary | ICD-10-CM | POA: Diagnosis not present

## 2023-10-12 DIAGNOSIS — Z85820 Personal history of malignant melanoma of skin: Secondary | ICD-10-CM | POA: Diagnosis not present

## 2023-10-12 DIAGNOSIS — D1801 Hemangioma of skin and subcutaneous tissue: Secondary | ICD-10-CM | POA: Diagnosis not present

## 2023-10-12 DIAGNOSIS — Z85828 Personal history of other malignant neoplasm of skin: Secondary | ICD-10-CM | POA: Diagnosis not present

## 2023-10-13 ENCOUNTER — Ambulatory Visit (HOSPITAL_COMMUNITY)
Admission: RE | Admit: 2023-10-13 | Discharge: 2023-10-13 | Discharge status: Home / Self Care | Payer: Medicare PPO | Source: Ambulatory Visit | Attending: Otolaryngology

## 2023-10-13 ENCOUNTER — Ambulatory Visit (HOSPITAL_COMMUNITY)
Admission: RE | Admit: 2023-10-13 | Discharge: 2023-10-13 | Disposition: A | Discharge status: Home / Self Care | Payer: Medicare PPO | Source: Ambulatory Visit | Attending: Otolaryngology | Admitting: Otolaryngology

## 2023-10-13 DIAGNOSIS — J40 Bronchitis, not specified as acute or chronic: Secondary | ICD-10-CM | POA: Insufficient documentation

## 2023-10-13 DIAGNOSIS — K224 Dyskinesia of esophagus: Secondary | ICD-10-CM | POA: Diagnosis not present

## 2023-10-13 DIAGNOSIS — K449 Diaphragmatic hernia without obstruction or gangrene: Secondary | ICD-10-CM | POA: Diagnosis not present

## 2023-10-13 DIAGNOSIS — R053 Chronic cough: Secondary | ICD-10-CM | POA: Diagnosis not present

## 2023-10-13 DIAGNOSIS — J189 Pneumonia, unspecified organism: Secondary | ICD-10-CM | POA: Insufficient documentation

## 2023-10-13 DIAGNOSIS — K219 Gastro-esophageal reflux disease without esophagitis: Secondary | ICD-10-CM | POA: Diagnosis not present

## 2023-10-13 DIAGNOSIS — Z0389 Encounter for observation for other suspected diseases and conditions ruled out: Secondary | ICD-10-CM | POA: Diagnosis not present

## 2023-10-13 DIAGNOSIS — I700 Atherosclerosis of aorta: Secondary | ICD-10-CM | POA: Diagnosis not present

## 2023-10-13 DIAGNOSIS — R0683 Snoring: Secondary | ICD-10-CM | POA: Insufficient documentation

## 2023-10-13 DIAGNOSIS — J4540 Moderate persistent asthma, uncomplicated: Secondary | ICD-10-CM | POA: Diagnosis not present

## 2023-10-13 DIAGNOSIS — I771 Stricture of artery: Secondary | ICD-10-CM | POA: Diagnosis not present

## 2023-10-14 ENCOUNTER — Other Ambulatory Visit: Payer: Self-pay

## 2023-10-14 ENCOUNTER — Ambulatory Visit (INDEPENDENT_AMBULATORY_CARE_PROVIDER_SITE_OTHER): Payer: Medicare PPO | Admitting: Otolaryngology

## 2023-10-14 NOTE — Progress Notes (Signed)
Specialty Pharmacy Refill Coordination Note  Jeffery Underwood is a 68 y.o. male contacted today regarding refills of specialty medication(s) Dupilumab   Patient requested Delivery   Delivery date: 10/22/23   Verified address: [address]  [place] [address]   Medication will be filled on 10/21/23.

## 2023-10-19 ENCOUNTER — Telehealth (INDEPENDENT_AMBULATORY_CARE_PROVIDER_SITE_OTHER): Payer: Self-pay

## 2023-10-19 NOTE — Telephone Encounter (Signed)
Spoke to the patient, gave him his results, per Dr Irene Pap unless he was having new symptoms, he could reschedule to see her in 4 weeks.  Patient will call back and schedule with her

## 2023-10-21 ENCOUNTER — Other Ambulatory Visit: Payer: Self-pay

## 2023-10-22 ENCOUNTER — Telehealth: Payer: Self-pay | Admitting: Internal Medicine

## 2023-10-22 ENCOUNTER — Ambulatory Visit (INDEPENDENT_AMBULATORY_CARE_PROVIDER_SITE_OTHER): Payer: Medicare PPO | Admitting: Otolaryngology

## 2023-10-22 ENCOUNTER — Other Ambulatory Visit (HOSPITAL_COMMUNITY): Payer: Self-pay

## 2023-10-22 NOTE — Telephone Encounter (Signed)
Patient filling Dupixent through [facility]. Unclear why [facility] is even reaching out about this. Prior authorization was approved through 09/20/2024  Chesley Mires, PharmD, MPH, BCPS, CPP Clinical Pharmacist (Rheumatology and Pulmonology)

## 2023-10-22 NOTE — Telephone Encounter (Signed)
Dupixent [facility] needs Prior Auth for patient's dupixent fax [phone]

## 2023-10-27 ENCOUNTER — Telehealth: Payer: Self-pay | Admitting: *Deleted

## 2023-10-27 DIAGNOSIS — C44619 Basal cell carcinoma of skin of left upper limb, including shoulder: Secondary | ICD-10-CM | POA: Diagnosis not present

## 2023-10-27 MED ORDER — METOPROLOL TARTRATE 25 MG PO TABS: 12.5000 mg | ORAL_TABLET | Freq: Two times a day (BID) | ORAL | 1 refills | Status: DC

## 2023-10-27 MED ORDER — LISINOPRIL 5 MG PO TABS: 5.0000 mg | ORAL_TABLET | Freq: Every day | ORAL | 1 refills | Status: DC

## 2023-10-27 NOTE — Telephone Encounter (Signed)
 Agree with resuming metoprolol  and lisinopril  at the prior doses. Happy to see him sooner. Okay to add on a quarter day in February.    Thanks  MJP    Pt aware that per Dr. Elmira, he can resume back his metoprolol  and lisinopril  at current dose and would like to see him back for follow-up of this, sometime this month.   Confirmed the pharmacy of choice with the pt.   Scheduled the pt to see Dr. Elmira on 2/20 at 1140.  He is aware to arrive 15 mins prior to this appt.  Pt verbalized understanding and agrees with this plan.

## 2023-10-27 NOTE — Telephone Encounter (Signed)
 Mychart message pt sent in today to Dr. Elmira to advise on is copied below:     This message will expire on Wednesday November 03, 2023 at  5:58 PM.    FW: Appointment Request  Patwardhan, Newman PARAS, MD [phone])  Sent: Wed October 27, 2023  5:57 PM  To: Gladis Porter HERO, LPN    Zeplin Aleshire  MRN: [id number] DOB: [birth year]  Pt Home: [phone]    Entered: [phone]      Message  Agree with resuming metoprolol  and lisinopril  at the prior doses. Happy to see him sooner. Okay to add on a quarter day in February.    Thanks  MJP  ----- Message -----  From: Gladis Porter HERO, LPN  Sent: 03/24/7973  11:51 AM EST  To: Newman PARAS Elmira, MD  Subject: FW: Appointment Request                          Please review message pt sent you and advise  ----- Message -----  From: Frederik Suzen RAMAN  Sent: 10/27/2023  11:14 AM EST  To: [facility] Triage  Subject: FW: Appointment Request                                                                     Appointment Request (Newest Message First)  Elmira Newman PARAS, MD  You21 minutes ago (5:58 PM)    Agree with resuming metoprolol  and lisinopril  at the prior doses. Happy to see him sooner. Okay to add on a quarter day in February.  Thanks MJP     You  Patwardhan, Manish J, MD6 hours ago (11:51 AM)    Please review message pt sent you and advise     Frederik Suzen RAMAN routed conversation to [employer] [employer] Triage7 hours ago (11:14 AM)   Deward Moats  P [facility] [address] [facility] [address] Scheduling7 hours ago (11:11 AM)    Appointment Request From: Deward Moats    Comments: med check and med refills.  You suggested I d/c two meds, Meoprolol and Lisinopril .  I did, and the next day I felt jittery, like I had consumed too much coffee (I did not).  Took B P and it was 160/90 and pulse was 140.  I went back on both med, felt better later in the day.  A couple weeks later I tried stopping Metopropol, and heart rate went up the  next day.  I began taking it again.  The next week I tried stopping Lisinopril , and my BP went up the next day.  I will need prescription refills

## 2023-11-03 DIAGNOSIS — B999 Unspecified infectious disease: Secondary | ICD-10-CM | POA: Diagnosis not present

## 2023-11-04 ENCOUNTER — Encounter: Payer: Self-pay | Admitting: Internal Medicine

## 2023-11-04 ENCOUNTER — Ambulatory Visit: Payer: Medicare PPO | Admitting: Internal Medicine

## 2023-11-04 VITALS — BP 120/66 | HR 73 | Temp 97.7°F | Ht 70.0 in | Wt 203.0 lb

## 2023-11-04 DIAGNOSIS — J4550 Severe persistent asthma, uncomplicated: Secondary | ICD-10-CM | POA: Diagnosis not present

## 2023-11-04 DIAGNOSIS — K219 Gastro-esophageal reflux disease without esophagitis: Secondary | ICD-10-CM | POA: Diagnosis not present

## 2023-11-04 DIAGNOSIS — J301 Allergic rhinitis due to pollen: Secondary | ICD-10-CM

## 2023-11-04 MED ORDER — OMEPRAZOLE 40 MG PO CPDR: 40.0000 mg | DELAYED_RELEASE_CAPSULE | Freq: Every day | ORAL | 3 refills | Status: DC

## 2023-11-04 NOTE — Patient Instructions (Addendum)
It was a pleasure to see you today!  Please schedule follow up scheduled with myself in 4 months.  If my schedule is not open yet, we will contact you with a reminder closer to that time. Please call [phone] if you haven't heard from [place] a month before, and always call us sooner if issues or concerns arise. You can also send [place] a message through MyChart, but but aware that this is not to be used for urgent issues and it may take up to 5-7 days to receive a reply. Please be aware that you will likely be able to view your results before I have a chance to respond to them. Please give [place] 5 business days to respond to any non-urgent results.    Your symptoms of coughing most likely related to reflux. We are starting a medication to help with this. Otherwise it will require lifestyle modification as we discussed.   Continue the advair 230 2 puffs twice daily, gargle after use. Continue albuterol as needed.  Continue the flonase, zyrtec, allegra, montelukast Continue dupixent for now   What is GERD? Gastroesophageal reflux disease (GERD) is gastroesophageal reflux diseasewhich occurs when the lower esophageal sphincter (LES) opens spontaneously, for varying periods of time, or does not close properly and stomach contents rise up into the esophagus. GER is also called acid reflux or acid regurgitation, because digestive juices--called acids--rise up with the food. The esophagus is the tube that carries food from the mouth to the stomach. The LES is a ring of muscle at the bottom of the esophagus that acts like a valve between the esophagus and stomach.  When acid reflux occurs, food or fluid can be tasted in the back of the mouth. When refluxed stomach acid touches the lining of the esophagus it may cause a burning sensation in the chest or throat called heartburn or acid indigestion. Occasional reflux is common. Persistent reflux that occurs more than twice a week is considered GERD, and it can  eventually lead to more serious health problems. People of all ages can have GERD. Studies have shown that GERD may worsen or contribute to asthma, chronic cough, and pulmonary fibrosis.   What are the symptoms of GERD? The main symptom of GERD in adults is frequent heartburn, also called acid indigestion--burning-type pain in the lower part of the mid-chest, behind the breast bone, and in the mid-abdomen.  Not all reflux is acidic in nature, and many patients don't have heart burn at all. Sometimes it feels like a cough (either dry or with mucus), choking sensation, asthma, shortness of breath, waking up at night, frequent throat clearing, or trouble swallowing.    What causes GERD? The reason some people develop GERD is still unclear. However, research shows that in people with GERD, the LES relaxes while the rest of the esophagus is working. Anatomical abnormalities such as a hiatal hernia may also contribute to GERD. A hiatal hernia occurs when the upper part of the stomach and the LES move above the diaphragm, the muscle wall that separates the stomach from the chest. Normally, the diaphragm helps the LES keep acid from rising up into the esophagus. When a hiatal hernia is present, acid reflux can occur more easily. A hiatal hernia can occur in people of any age and is most often a normal finding in otherwise healthy people over age 69. Most of the time, a hiatal hernia produces no symptoms.   Other factors that may contribute to GERD include -  Obesity or recent weight gain - Pregnancy  - Smoking  - Diet - Certain medications  Common foods that can worsen reflux symptoms include: - carbonated beverages - artificial sweeteners - citrus fruits  - chocolate  - drinks with caffeine or alcohol  - fatty and fried foods  - garlic and onions  - mint flavorings  - spicy foods  - tomato-based foods, like spaghetti sauce, salsa, chili, and pizza   Lifestyle Changes If you smoke, stop.  Avoid  foods and beverages that worsen symptoms (see above.) Lose weight if needed.  Eat small, frequent meals.  Wear loose-fitting clothes.  Avoid lying down for 3 hours after a meal.  Raise the head of your bed 6 to 8 inches by securing wood blocks under the bedposts. Just using extra pillows will not help, but using a wedge-shaped pillow may be helpful.  Medications  H2 blockers, such as cimetidine (Tagamet HB), famotidine (Pepcid AC), nizatidine (Axid AR), and ranitidine (Zantac 75), decrease acid production. They are available in prescription strength and over-the-counter strength. These drugs provide short-term relief and are effective for about half of those who have GERD symptoms.  Proton pump inhibitors include omeprazole (Prilosec, Zegerid), lansoprazole (Prevacid), pantoprazole (Protonix), rabeprazole (Aciphex), and esomeprazole (Nexium), which are available by prescription. Prilosec is also available in over-the-counter strength. Proton pump inhibitors are more effective than H2 blockers and can relieve symptoms and heal the esophageal lining in almost everyone who has GERD.  Because drugs work in different ways, combinations of medications may help control symptoms. People who get heartburn after eating may take both antacids and H2 blockers. The antacids work first to neutralize the acid in the stomach, and then the H2 blockers act on acid production. By the time the antacid stops working, the H2 blocker will have stopped acid production. Your health care provider is the best source of information about how to use medications for GERD.   Points to Remember 1. You can have GERD without having heartburn. Your symptoms could include a dry cough, asthma symptoms, or trouble swallowing.  2. Taking medications daily as prescribed is important in controlling you symptoms.  Sometimes it can take up to 8 weeks to fully achieve the effects of the medications prescribed.  3. Coughing related to GERD  can be difficult to treat and is very frustrating!  However, it is important to stick with these medications and lifestyle modifications before pursuing more aggressive or invasive test and treatments.

## 2023-11-04 NOTE — Progress Notes (Addendum)
Jeffery Underwood    [id number]    12-02-1955  Primary Care Physician:Wolters, Jasmine December, MD Date of Appointment: 11/04/2023 Established Patient Visit  Chief complaint:   Chief Complaint  Patient presents with   Follow-up    Productive cough persistent.     HPI: Jeffery Underwood is a 68 y.o. 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   Interval Updates: Here for asthma follow up. Started dupixent Nov 2024. He saw ENT and got antibiotics and steroids from Dr. Deboraha Sprang as well as Dr. Chanetta Marshall.   Coughing feels good today.   Saw ENT - esophgram showed reflux, dysmotility. He drinks 2-3 beers/day. Has coffee daily.    Had some arthralgias with the first couple shots of dupixent, but none since then.  Current Regimen: advair 230 2 puffs BID, albuterol prn, dupixent. Asthma Triggers:URIs, seasonal allergies Exacerbations in the last year:5-6 times since January 2024.  History of hospitalization or intubation: never Allergy Testing: sees Dr. Allena Katz in allergy -  GERD: denies Allergic Rhinitis: flonase, zyrtec, singulair ACT:  Asthma Control Test ACT Total Score  11/04/2023  8:50 AM 20  08/04/2023  8:36 AM 23  05/03/2023 11:07 AM 25   FeNO:  I have reviewed the patient's family social and past medical history and updated as appropriate.   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   Right Anterior Neck   OSA (obstructive sleep apnea)    Prediabetes 09/21/2004   resolved 10/2011   Psoriasis    Recurrent upper respiratory infection (URI)    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   Right Sideburn (well diff)   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   Left Sideburn (in situ)   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   Left [place] (well  diff)   Seasonal allergies     Past Surgical History:  Procedure Laterality Date   CARDIOVERSION N/A 08/05/2021   Procedure: CARDIOVERSION;  Surgeon: Yates Decamp, MD;  Location: [facility] ENDOSCOPY;  Service: Cardiovascular;  Laterality: N/A;   INGUINAL HERNIA REPAIR     right side (4-5 years ago)   mohs Right     Family History  Problem Relation Age of Onset   Asthma Mother    Depression Mother    Tuberculosis Mother    Osteoporosis Mother    Diabetes type II Father    Atrial fibrillation Father     Social History   Occupational History   Not on file  Tobacco Use   Smoking status: Never   Smokeless tobacco: Never  Vaping Use   Vaping status: Never Used  Substance and Sexual Activity   Alcohol use: Yes    Alcohol/week: 14.0 standard drinks of alcohol    Types: 14 Cans of beer per week    Comment: 3-5 per week   Drug use: No   Sexual activity: Yes     Physical Exam: Blood pressure 120/66, pulse 73, temperature 97.7 F (36.5 C), temperature source Oral, height 5\' 10"  (1.778 m), weight 203 lb (92.1 kg), SpO2 94%.  Gen:      No acute distress, occasional cough ENT: mild nasal debris, +cobblestoning, no nasal polyps Lungs:    ctab no wheeze CV:  RRR no mrg   Data Reviewed: Imaging: I have personally reviewed the CT Cardiac scoring - visualized lung windows are clear  PFTs:      No data to display         I have personally reviewed the patient's PFTs and spirometry Nov 2023 -  moderate airflow limitation  Labs: No peripheral eosinophilia SPT - dust mites, cat Immunization status: Immunization History  Administered Date(s) Administered   Influenza-Unspecified 06/29/2022   Moderna Covid-19 Vaccine Bivalent Booster 41yrs & up 07/27/2021   PFIZER(Purple Top)SARS-COV-2 Vaccination 11/25/2019, 12/16/2019, 06/26/2020, 12/23/2020    External Records Personally Reviewed: allergy, pulmonary  Assessment:  Moderate persistent asthma, not well controlled  dependent on systemic steroids, now on dupixent Chronic allergic rhinitis GERD not well controlled  Plan/Recommendations:  Your symptoms of coughing most likely related to reflux. We are starting a medication to help with this. Otherwise it will require lifestyle modification as we discussed.   continue the advair 230 2 puffs twice daily, gargle after use. continue albuterol as needed.  continue the flonase, zyrtec, allegra, montelukast Continue dupixent  Return to Care: Return in about 4 months (around 03/03/2024).   Durel Salts, MD Pulmonary and Critical Care Medicine [facility] Office:[phone]

## 2023-11-05 DIAGNOSIS — C44612 Basal cell carcinoma of skin of right upper limb, including shoulder: Secondary | ICD-10-CM | POA: Diagnosis not present

## 2023-11-05 DIAGNOSIS — T50905A Adverse effect of unspecified drugs, medicaments and biological substances, initial encounter: Secondary | ICD-10-CM | POA: Diagnosis not present

## 2023-11-11 ENCOUNTER — Ambulatory Visit: Payer: Medicare PPO | Admitting: Cardiology

## 2023-11-11 NOTE — Progress Notes (Deleted)
 Cardiology Office Note:  .   Date:  11/11/2023  ID:  Jeffery Underwood, DOB 1956/05/14, MRN [id number] PCP: Mila Palmer, MD  [facility] Providers Cardiologist:  Truett Mainland, MD PCP: Mila Palmer, MD  No chief complaint on file.     History of Present Illness: .    Jeffery Underwood is a 68 y.o. male with hypertension,OSA on CPAP, h/o persistent atypical atrial flutter/fibrillation.  *** Patient denies any significant chest pain or shortness of breath symptoms.  He has noticed productive cough, due to lisinopril.  Seeing pulmonologist Dr. Durel Salts for the same.  Separate note, has had multiple skin surgeries done recently.    There were no vitals filed for this visit.    ROS:  Review of Systems  Cardiovascular:  Negative for chest pain, dyspnea on exertion, leg swelling, palpitations and syncope.     Studies Reviewed: Marland Kitchen       EKG 08/10/2023: Sinus rhythm with 1st degree A-V block When compared with ECG of 21-Jun-2022 16:02, PREVIOUS ECG IS PRESENT  Labs 06/2023: Chol 129, TG 112, HDL 42, LDL 59 (06/2023)  Exercise nuclear stress test 02/04/2023: Myocardial perfusion is normal. Overall LV systolic function is normal without regional wall motion abnormalities. Stress LV EF: 68%. Low risk study. Normal ECG stress. Peak EKG/ECG demonstrated sinus tachycardia. The patient exercised for 7 minutes and 59 seconds of a Bruce protocol, achieving approximately 10.13 METs and 87% MPHR.  The heart rate response was normal. The blood pressure response was normal. No previous exam available for comparison.   Echocardiogram 01/29/2023:  Normal LV systolic function with visual EF 55-60%. Left ventricle cavity  is normal in size. Normal left ventricular wall thickness. Normal global  wall motion. Normal diastolic filling pattern, normal LAP. Calculated EF  53%.  Left atrial cavity is mildly dilated.  Structurally normal tricuspid valve with no regurgitation. No evidence  of  pulmonary hypertension.  No significant change compared to 06/2021.    Risk Assessment/Calculations:    CHA2DS2-VASc Score = 2  This indicates a 2.22.2% annual risk of stroke. The patient's score is based upon: CHF History: 0 HTN History: 1 Diabetes History: 0 Stroke History: 0 Vascular Disease History: 0 Age Score: 1 Gender Score: 0     Physical Exam:   Physical Exam Vitals and nursing note reviewed.  Constitutional:      General: He is not in acute distress. Neck:     Vascular: No JVD.  Cardiovascular:     Rate and Rhythm: Normal rate and regular rhythm.     Heart sounds: Normal heart sounds. No murmur heard. Pulmonary:     Effort: Pulmonary effort is normal.     Breath sounds: Normal breath sounds. No wheezing or rales.  Musculoskeletal:     Right lower leg: No edema.     Left lower leg: No edema.      VISIT DIAGNOSES: No diagnosis found.    ASSESSMENT AND PLAN: .    Jeffery Underwood is a 68 y.o. male with hypertension,OSA on CPAP, h/o persistent atypical atrial flutter/fibrillation.   *** Decreased exercise tolerance: Reassuring echocardiogram and stress test.  No recent A-fib recurrence.  My suspicion for cardiac etiology is low.  *** Cough: In addition to multiple other factors, lisinopril could be a possible contributor.  Stop lisinopril 5 mg.  *** Elevated calcium score: Total: 114  Percentile: 52nd, none in left main Chol 172, TG 129, HDL 42, LDL 107 (12/2022) Chol 129, TG 112, HDL 42,  LDL 59 (06/2023) In addition to heart healthy diet and lifestyle, continue Crestor 10 mg daily.     *** H/o persistent Afib/flutter: In sinus rhythm today. Encouraged to use smart watch regularly. CHA2DS2VASc score 2, annual stroke risk 2,3% Continue Eliquis 5 mg bid.   *** Hypertension: Stopped lisinopril 5 mg daily, as well as metoprolol tartrate 12.5 mg twice daily, which is a very low dose.  Anticipate his blood pressure to remain under 130/80 mmHg.   If he notices blood pressure consistently >130/80 mmHg, or heart rate >80 bpm, we could consider adding losartan 25 mg and/or increase diltiazem to 240 mg daily.      No orders of the defined types were placed in this encounter.   ***  Signed, Elder Negus, MD

## 2023-11-16 ENCOUNTER — Other Ambulatory Visit: Payer: Self-pay

## 2023-11-19 ENCOUNTER — Other Ambulatory Visit: Payer: Self-pay

## 2023-11-19 NOTE — Progress Notes (Signed)
 Specialty Pharmacy Refill Coordination Note  Jeffery Underwood is a 68 y.o. male contacted today regarding refills of specialty medication(s) Dupilumab   Patient requested Delivery   Delivery date: 11/23/23   Verified address: [address]  [place] [address]   Medication will be filled on 11/22/23.

## 2023-11-22 ENCOUNTER — Other Ambulatory Visit: Payer: Self-pay

## 2023-11-22 ENCOUNTER — Other Ambulatory Visit (HOSPITAL_COMMUNITY): Payer: Self-pay

## 2023-11-22 DIAGNOSIS — L929 Granulomatous disorder of the skin and subcutaneous tissue, unspecified: Secondary | ICD-10-CM | POA: Diagnosis not present

## 2023-11-23 ENCOUNTER — Encounter: Payer: Self-pay | Admitting: Cardiology

## 2023-11-23 ENCOUNTER — Ambulatory Visit: Attending: Cardiology | Admitting: Cardiology

## 2023-11-23 VITALS — BP 116/70 | HR 56 | Resp 16 | Ht 70.0 in | Wt 200.8 lb

## 2023-11-23 DIAGNOSIS — I10 Essential (primary) hypertension: Secondary | ICD-10-CM

## 2023-11-23 DIAGNOSIS — I4819 Other persistent atrial fibrillation: Secondary | ICD-10-CM

## 2023-11-23 NOTE — Patient Instructions (Signed)
  Follow-Up: At [facility], you and your health needs are our priority.  As part of our continuing mission to provide you with exceptional heart care, we have created designated Provider Care Teams.  These Care Teams include your primary Cardiologist (physician) and Advanced Practice Providers (APPs -  Physician Assistants and Nurse Practitioners) who all work together to provide you with the care you need, when you need it.   Your next appointment:   1 year(s)  Provider:   Elder Negus, MD     Other Instructions   1st Floor: - Lobby - Registration  - Pharmacy  - Lab - Cafe  2nd Floor: - PV Lab - Diagnostic Testing (echo, CT, nuclear med)  3rd Floor: - Vacant  4th Floor: - TCTS (cardiothoracic surgery) - AFib Clinic - Structural Heart Clinic - Vascular Surgery  - Vascular Ultrasound  5th Floor: - HeartCare Cardiology (general and EP) - Clinical Pharmacy for coumadin, hypertension, lipid, weight-loss medications, and med management appointments    Valet parking services will be available as well.

## 2023-11-23 NOTE — Progress Notes (Signed)
 Cardiology Office Note:  .   Date:  11/23/2023  ID:  Reine Just, DOB Jan 10, 1956, MRN [id number] PCP: Mila Palmer, MD  [facility] Providers Cardiologist:  Truett Mainland, MD PCP: Mila Palmer, MD  Chief Complaint  Patient presents with   Follow-up      History of Present Illness: .    Jeffery Underwood is a 68 y.o. male with hypertension,OSA on CPAP, h/o persistent atypical atrial flutter/fibrillation, elevated CAC.  Patient reported spiking blood pressure and jitteriness after stopping metoprolol and lisinopril few months ago.  The latter was loosely suspected to be cause for his cough.  However, given his symptoms after stopping the medication, he is not back on both medications.  He is working with pulmonology for management of cough, which is thought to be due to GERD.  Vitals:   11/23/23 1112  BP: 116/70  Pulse: (!) 56  Resp: 16  SpO2: 95%      ROS:  Review of Systems  Cardiovascular:  Negative for chest pain, dyspnea on exertion, leg swelling, palpitations and syncope.     Studies Reviewed: Marland Kitchen       EKG 08/10/2023: Sinus rhythm with 1st degree A-V block When compared with ECG of 21-Jun-2022 16:02, PREVIOUS ECG IS PRESENT  Labs 06/2023: Chol 129, TG 112, HDL 42, LDL 59 (06/2023)  Exercise nuclear stress test 02/04/2023: Myocardial perfusion is normal. Overall LV systolic function is normal without regional wall motion abnormalities. Stress LV EF: 68%. Low risk study. Normal ECG stress. Peak EKG/ECG demonstrated sinus tachycardia. The patient exercised for 7 minutes and 59 seconds of a Bruce protocol, achieving approximately 10.13 METs and 87% MPHR.  The heart rate response was normal. The blood pressure response was normal. No previous exam available for comparison.   Echocardiogram 01/29/2023:  Normal LV systolic function with visual EF 55-60%. Left ventricle cavity  is normal in size. Normal left ventricular wall thickness. Normal global  wall  motion. Normal diastolic filling pattern, normal LAP. Calculated EF  53%.  Left atrial cavity is mildly dilated.  Structurally normal tricuspid valve with no regurgitation. No evidence of  pulmonary hypertension.  No significant change compared to 06/2021.    Risk Assessment/Calculations:    CHA2DS2-VASc Score = 2  This indicates a 2.22.2% annual risk of stroke. The patient's score is based upon: CHF History: 0 HTN History: 1 Diabetes History: 0 Stroke History: 0 Vascular Disease History: 0 Age Score: 1 Gender Score: 0     Physical Exam:   Physical Exam Vitals and nursing note reviewed.  Constitutional:      General: He is not in acute distress. Neck:     Vascular: No JVD.  Cardiovascular:     Rate and Rhythm: Normal rate and regular rhythm.     Heart sounds: Normal heart sounds. No murmur heard. Pulmonary:     Effort: Pulmonary effort is normal.     Breath sounds: Normal breath sounds. No wheezing or rales.  Musculoskeletal:     Right lower leg: No edema.     Left lower leg: No edema.      VISIT DIAGNOSES:   ICD-10-CM   1. Persistent atrial fibrillation (HCC)  I48.19     2. Primary hypertension  I10         ASSESSMENT AND PLAN: .    Jeffery Underwood is a 68 y.o. male with hypertension,OSA on CPAP, h/o persistent atypical atrial flutter/fibrillation, elevated CAC    Elevated calcium score: Total: 114  Percentile:  52nd, none in left main No ischemia on stress testing (01/2023). Chol 172, TG 129, HDL 42, LDL 107 (12/2022) Chol 129, TG 112, HDL 42, LDL 59 (06/2023) In addition to heart healthy diet and lifestyle, continue Crestor 10 mg daily.      H/o persistent Afib/flutter: In sinus rhythm today. Encouraged to use smart watch regularly. CHA2DS2VASc score 2, annual stroke risk 2,3% Continue Eliquis 5 mg bid.   Hypertension: Well-controlled on metoprolol tartrate 12.5 mg daily, and as above 5 mg daily. Given his symptoms of spike in blood pressure  jitteriness of stopping the medications, I would like to continue the same medications, which are controlling his blood pressure well.    Cough: Continue follow-up with pulmonology    No orders of the defined types were placed in this encounter.   F/u in 1 year  Signed, Elder Negus, MD

## 2023-12-05 DIAGNOSIS — M25562 Pain in left knee: Secondary | ICD-10-CM | POA: Diagnosis not present

## 2023-12-05 DIAGNOSIS — M25572 Pain in left ankle and joints of left foot: Secondary | ICD-10-CM | POA: Diagnosis not present

## 2023-12-05 DIAGNOSIS — R600 Localized edema: Secondary | ICD-10-CM | POA: Diagnosis not present

## 2023-12-10 ENCOUNTER — Other Ambulatory Visit: Payer: Self-pay | Admitting: Cardiology

## 2023-12-10 DIAGNOSIS — I484 Atypical atrial flutter: Secondary | ICD-10-CM

## 2023-12-14 ENCOUNTER — Other Ambulatory Visit: Payer: Self-pay

## 2023-12-14 NOTE — Progress Notes (Signed)
 Specialty Pharmacy Refill Coordination Note  Jeffery Underwood is a 68 y.o. male contacted today regarding refills of specialty medication(s) Dupilumab   Patient requested (Patient-Rptd) Delivery   Delivery date: 12/17/23 (sent mychart msg to inform of updated delivery date)   Verified address: (Patient-Rptd) [address] [place], [place] [address]   Medication will be filled on 12/16/23.

## 2023-12-15 ENCOUNTER — Ambulatory Visit (HOSPITAL_COMMUNITY)
Admission: RE | Admit: 2023-12-15 | Discharge: 2023-12-15 | Disposition: A | Discharge status: Home / Self Care | Source: Ambulatory Visit | Attending: Cardiology | Admitting: Cardiology

## 2023-12-15 ENCOUNTER — Other Ambulatory Visit (HOSPITAL_COMMUNITY): Payer: Self-pay | Admitting: Family Medicine

## 2023-12-15 DIAGNOSIS — M25562 Pain in left knee: Secondary | ICD-10-CM | POA: Diagnosis not present

## 2023-12-15 DIAGNOSIS — R609 Edema, unspecified: Secondary | ICD-10-CM

## 2023-12-15 DIAGNOSIS — M7989 Other specified soft tissue disorders: Secondary | ICD-10-CM | POA: Diagnosis not present

## 2023-12-16 ENCOUNTER — Other Ambulatory Visit: Payer: Self-pay

## 2023-12-20 DIAGNOSIS — M25562 Pain in left knee: Secondary | ICD-10-CM | POA: Diagnosis not present

## 2023-12-21 ENCOUNTER — Ambulatory Visit: Payer: Medicare PPO | Admitting: Cardiology

## 2023-12-24 DIAGNOSIS — H02845 Edema of left lower eyelid: Secondary | ICD-10-CM | POA: Diagnosis not present

## 2023-12-24 DIAGNOSIS — H00035 Abscess of left lower eyelid: Secondary | ICD-10-CM | POA: Diagnosis not present

## 2024-01-06 ENCOUNTER — Other Ambulatory Visit (HOSPITAL_COMMUNITY): Payer: Self-pay

## 2024-01-06 ENCOUNTER — Other Ambulatory Visit: Payer: Self-pay

## 2024-01-06 DIAGNOSIS — G4733 Obstructive sleep apnea (adult) (pediatric): Secondary | ICD-10-CM | POA: Diagnosis not present

## 2024-01-06 NOTE — Progress Notes (Signed)
 Specialty Pharmacy Refill Coordination Note  Jeffery Underwood is a 68 y.o. male contacted today regarding refills of specialty medication(s) Dupixent.  Patient requested (Patient-Rptd) Delivery   Delivery date: (Patient-Rptd) 01/18/24   Verified address: (Patient-Rptd) [address], [place] [address]   Medication will be filled on 01/17/24.

## 2024-01-15 ENCOUNTER — Other Ambulatory Visit (HOSPITAL_COMMUNITY): Payer: Self-pay

## 2024-01-18 DIAGNOSIS — L570 Actinic keratosis: Secondary | ICD-10-CM | POA: Diagnosis not present

## 2024-01-18 DIAGNOSIS — D2239 Melanocytic nevi of other parts of face: Secondary | ICD-10-CM | POA: Diagnosis not present

## 2024-01-18 DIAGNOSIS — L821 Other seborrheic keratosis: Secondary | ICD-10-CM | POA: Diagnosis not present

## 2024-01-18 DIAGNOSIS — L249 Irritant contact dermatitis, unspecified cause: Secondary | ICD-10-CM | POA: Diagnosis not present

## 2024-01-18 DIAGNOSIS — L578 Other skin changes due to chronic exposure to nonionizing radiation: Secondary | ICD-10-CM | POA: Diagnosis not present

## 2024-01-18 DIAGNOSIS — L814 Other melanin hyperpigmentation: Secondary | ICD-10-CM | POA: Diagnosis not present

## 2024-01-18 DIAGNOSIS — D0461 Carcinoma in situ of skin of right upper limb, including shoulder: Secondary | ICD-10-CM | POA: Diagnosis not present

## 2024-01-18 DIAGNOSIS — B078 Other viral warts: Secondary | ICD-10-CM | POA: Diagnosis not present

## 2024-01-18 DIAGNOSIS — D1801 Hemangioma of skin and subcutaneous tissue: Secondary | ICD-10-CM | POA: Diagnosis not present

## 2024-01-18 DIAGNOSIS — Z85820 Personal history of malignant melanoma of skin: Secondary | ICD-10-CM | POA: Diagnosis not present

## 2024-01-18 DIAGNOSIS — D485 Neoplasm of uncertain behavior of skin: Secondary | ICD-10-CM | POA: Diagnosis not present

## 2024-01-18 DIAGNOSIS — Z85828 Personal history of other malignant neoplasm of skin: Secondary | ICD-10-CM | POA: Diagnosis not present

## 2024-01-25 ENCOUNTER — Ambulatory Visit (HOSPITAL_BASED_OUTPATIENT_CLINIC_OR_DEPARTMENT_OTHER): Attending: Sports Medicine | Admitting: Physical Therapy

## 2024-01-25 ENCOUNTER — Other Ambulatory Visit: Payer: Self-pay

## 2024-01-25 DIAGNOSIS — M6281 Muscle weakness (generalized): Secondary | ICD-10-CM | POA: Insufficient documentation

## 2024-01-25 DIAGNOSIS — M25562 Pain in left knee: Secondary | ICD-10-CM | POA: Diagnosis not present

## 2024-01-25 NOTE — Therapy (Signed)
 OUTPATIENT PHYSICAL THERAPY LOWER EXTREMITY EVALUATION   Patient Name: Jeffery Underwood MRN: [id number] DOB:Feb 19, 1956, 68 y.o., male Today's Date: 01/26/2024  END OF SESSION:  PT End of Session - 01/25/24 1321     Visit Number 1    Number of Visits 8    Date for PT Re-Evaluation 03/21/24    Authorization Type [employer] MCR    PT Start Time 1320    PT Stop Time 1415    PT Time Calculation (min) 55 min    Activity Tolerance Patient tolerated treatment well    Behavior During Therapy WFL for tasks assessed/performed             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   Right Anterior Neck   OSA (obstructive sleep apnea)    Prediabetes 09/21/2004   resolved 10/2011   Psoriasis    Recurrent upper respiratory infection (URI)    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   Right Sideburn (well diff)   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   Left Sideburn (in situ)   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   Left Ala Nasi (well diff)   Seasonal allergies    Past Surgical History:  Procedure Laterality Date   CARDIOVERSION N/A 08/05/2021   Procedure: CARDIOVERSION;  Surgeon: Knox Perl, MD;  Location: [facility] ENDOSCOPY;  Service: Cardiovascular;  Laterality: N/A;   INGUINAL HERNIA REPAIR     right side (4-5 years ago)   mohs Right    Patient Active Problem List   Diagnosis Date Noted   Primary hypertension 02/12/2023   Elevated coronary artery calcium  score 02/12/2023   Mixed hyperlipidemia 02/12/2023   Dyspnea on exertion 01/12/2023   Decreased exercise tolerance 01/12/2023   Asthmatic bronchitis, moderate persistent, uncomplicated 09/24/2022   Atypical atrial flutter (HCC) 07/07/2021   Persistent atrial fibrillation (HCC) 07/07/2021   OSA (obstructive sleep apnea) 09/22/2013      REFERRING PROVIDER: Rance Burrows, MD  REFERRING DIAG: [phone] (ICD-10-CM) - Pain in left knee  THERAPY DIAG:  Left knee pain, unspecified chronicity  Muscle weakness (generalized)  Rationale for Evaluation and Treatment: Rehabilitation  ONSET DATE: Mid March 2025  SUBJECTIVE:   SUBJECTIVE STATEMENT: 7 weeks ago, he stood up from a chair in a restaurant and felt like he was going to roll his ankle.  He moved his knee quickly medially in order to not roll his ankle.  Pt didn't have pain at the time.  36 hours later, he noticed swelling in knee and down L LE.  Pt went to a walk-in clinic and had x rays.  MD suspected a Baker's cyst.  Pt continued to have swelling and had and had a venous doppler which indicated potential hematoma in calf per MD note.  Pt had no blood clot.  Pt had another set of x rays.  MD notes indicated x ray confimred arthritis on inside of knee, not severe enough for surgery.  Baker's cyst is attribulted to underlying arthritis with fluid accumulation ruptures during quick movements causing pain and swelling down the leg.      He still has swelling in knee.  Pt is cautious with walking though did recently start his walking again.  Pt states he can limp with walking.  He walked 3.5 miles yesterday without adverse  effects.  He c/o's of stiffness in bilat knees.  Pt is cautious on steps and has some difficulty with stairs.  He was able to climb a ladder. Pt is a [employer] member and has not been working out or playing pickleball due to L knee/LE sx's. "It feels good today."  PT order on 12/20/23 indicated Suspeced ruptured baker's cyst.  Work on L LE strength.  MD note indicated PT 6-8 weeks, once per week.  Activity as tolerated, reduce if pain exceeds 3/10.  Using a knee sleeve or compression sock for comfort.    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 Yes. Number of falls 1 while playing pickleball  LIVING ENVIRONMENT: Lives with: lives with their spouse Lives in: 1 story home Stairs: 3-4 steps to enter home with rail Has following equipment at home: None  OCCUPATION: Pt is retired.   PLOF: Independent  Pt was walking 3-4 miles per day.  Pt able to perform workout activities and play pickleball.   PATIENT GOALS: reduce swelling and improve strength in LE  NEXT MD VISIT: 02/09/24  OBJECTIVE:  Note: Objective measures were completed at Evaluation unless otherwise noted.  DIAGNOSTIC FINDINGS: MD notes indicated x ray confimred arthritis on inside of knee, not severe enough for surgery.  PATIENT SURVEYS:  LEFS 51/80  COGNITION: Overall cognitive status: Within functional limits for tasks assessed      EDEMA:  Pt has swelling in L knee.   Girth measurement at mid patella:  R:  38.3, L:  41  MUSCLE LENGTH: Pt is very tight in bilat HS   PALPATION: TTP in medial L knee at joint line.  Pt had no tenderness in other areas of knee including posterior knee.   LOWER EXTREMITY ROM:  AROM Right eval Left eval  Hip flexion    Hip extension    Hip abduction    Hip adduction    Hip internal rotation    Hip external rotation    Knee flexion 132 122/130  AROM/PROM  Knee extension 0 0  Ankle dorsiflexion    Ankle plantarflexion    Ankle inversion    Ankle eversion     (Blank rows = not tested)  LOWER EXTREMITY MMT:  MMT Right eval Left eval  Hip flexion 4+/5 5/5  Hip extension 4/5 4+/5  Hip abduction 5/5 5/5  Hip adduction    Hip internal rotation    Hip external rotation    Knee flexion 5/5 seated 5/5 setaed  Knee extension 5/5 4/5  Ankle dorsiflexion    Ankle plantarflexion    Ankle inversion    Ankle eversion     (Blank rows = not  tested)    GAIT: Assistive device utilized: None Level of assistance: Complete Independence Comments: Pt has a heel to toe gait pattern without limping.  Increased toe out on R.  TREATMENT:    Pt performed supine heel slides 2x10, Ankle pumps approx 15-20 reps, and supine SLR x 10 reps.  Pt received a HEP handout and was educated in correct form and appropriate frequency.   PATIENT EDUCATION:  Education details: dx, relevant anatomy, objective findings, POC, HEP, rationale of interventions, and what to expect next Rx.  PT answered pt's questions.  Person educated: Patient Education method: Explanation, Demonstration, Tactile cues, Verbal cues, and Handouts Education comprehension: verbalized understanding, returned demonstration, verbal cues required, tactile cues required, and needs further education  HOME EXERCISE PROGRAM: Access Code: [email or web address] URL: [email or web address] Date: 01/25/2024 Prepared by: Marnie Siren  Exercises - Supine Heel Slide  - 2 x daily - 7 x weekly - 2 sets - 10 reps - Supine Ankle Pumps  - 2-3 x daily - 7 x weekly - 2 sets - 10 reps - Supine Active Straight Leg Raise  - 1 x daily - 7 x weekly - 2 sets - 10 reps  ASSESSMENT:  CLINICAL IMPRESSION: Patient is a 68 y.o. male with a dx of L knee pain.  MD note indicates pt having a suspected ruptured baker's cyst.  Pt has continued swelling in knee.  He is cautious with walking and performing stairs.  Pt reports difficulty with stairs and occasional limping with gait.  PT observed no limp today.  He c/o's of stiffness in bilat knees.  Pt has not been working out or playing pickleball due to L knee/LE sx's.  Pt's L knee flexion ROM is WFL though pt does have less ROM than R knee.  He has weakness in L quad and bilat hip extension.  Pt should benefit from skilled PT to  address impairments and improve overall function.    OBJECTIVE IMPAIRMENTS: decreased activity tolerance, decreased mobility, difficulty walking, decreased ROM, decreased strength, hypomobility, increased edema, and pain.   ACTIVITY LIMITATIONS: stairs and locomotion level  PARTICIPATION LIMITATIONS:  recreational/workout activities  PERSONAL FACTORS: 1 comorbidity: A-fib  are also affecting patient's functional outcome.   REHAB POTENTIAL: Good  CLINICAL DECISION MAKING: Stable/uncomplicated  EVALUATION COMPLEXITY: Low   GOALS:  SHORT TERM GOALS: Target date: 02/15/2024  Pt will be independent and compliant with HEP for improved pain, strength, ROM, and function.  Baseline: Goal status: INITIAL  2.  Pt will report at least a 25% improvement in pain and sx's overall.  Baseline:  Goal status: INITIAL  3.  Pt's swelling will improve as evidenced by mid patella measurement being no > than 39 cm for improved mobility Baseline:  Goal status: INITIAL Target date:  02/22/2024    LONG TERM GOALS: Target date: 03/21/2024  Pt will demo improved L quad strength to 5/5 MMT for improved ambulation and functional mobility. Baseline:  Goal status: INITIAL  2.  Pt will demonstrate a reciprocal gait on stairs with good control with a rail.   Baseline:  Goal status: INITIAL  3.  Pt will perform his walking program without adverse effects.  Baseline:  Goal status: INITIAL  4.  Pt will be able to play pickleball without adverse effects.  Baseline:  Goal status: INITIAL   PLAN:  PT FREQUENCY: 1x/week  PT DURATION: 6-8 weeks  PLANNED INTERVENTIONS: 97164- PT Re-evaluation, 97750- Physical Performance Testing, 97110-Therapeutic exercises, 97530- Therapeutic activity, [id number]- Neuromuscular re-education, 97535- Self Care, 62130- Manual therapy, [id number]- Gait training, [phone]- Aquatic Therapy, [phone]- Vasopneumatic device, [phone]- Ultrasound, Patient/Family education, Balance training, Stair  training, Taping, Dry Needling, Joint mobilization, Cryotherapy, and Moist heat  PLAN FOR NEXT SESSION: Review and perform HEP.  Monitor swelling.  Cont with L hip and knee strengthening per pt tolerance.  HS stretching.    Referring diagnosis?  M25.562 (ICD-10-CM) - Pain in left knee   Treatment diagnosis? (if different than referring diagnosis)  Left knee pain, unspecified chronicity  Muscle weakness (generalized)  What was this (referring dx) caused by? []  Surgery []  Fall []  Ongoing issue []  Arthritis [x]  Other: ____________  Laterality: []  Rt [x]  Lt []  Both  Check all possible CPT codes:  *CHOOSE 10 OR LESS*    See Planned Interventions listed in the Plan section of the Evaluation.   Trina Fujita III PT, DPT 01/26/24 11:56 PM

## 2024-01-31 ENCOUNTER — Encounter (HOSPITAL_BASED_OUTPATIENT_CLINIC_OR_DEPARTMENT_OTHER): Payer: Self-pay | Admitting: Physical Therapy

## 2024-01-31 ENCOUNTER — Ambulatory Visit (HOSPITAL_BASED_OUTPATIENT_CLINIC_OR_DEPARTMENT_OTHER): Payer: Self-pay | Admitting: Physical Therapy

## 2024-01-31 DIAGNOSIS — G4733 Obstructive sleep apnea (adult) (pediatric): Secondary | ICD-10-CM | POA: Diagnosis not present

## 2024-01-31 DIAGNOSIS — M6281 Muscle weakness (generalized): Secondary | ICD-10-CM

## 2024-01-31 DIAGNOSIS — M25562 Pain in left knee: Secondary | ICD-10-CM | POA: Diagnosis not present

## 2024-01-31 NOTE — Therapy (Signed)
 OUTPATIENT PHYSICAL THERAPY LOWER EXTREMITY TREATMENT   Patient Name: Jeffery Underwood MRN: [id number] DOB:Jun 14, 1956, 68 y.o., male Today's Date: 01/31/2024  END OF SESSION:  PT End of Session - 01/31/24 1020     Visit Number 2    Number of Visits 8    Date for PT Re-Evaluation 03/21/24    Authorization Type [employer] MCR    PT Start Time 1018    PT Stop Time 1058    PT Time Calculation (min) 40 min    Behavior During Therapy WFL for tasks assessed/performed             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   Right Anterior Neck   OSA (obstructive sleep apnea)    Prediabetes 09/21/2004   resolved 10/2011   Psoriasis    Recurrent upper respiratory infection (URI)    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   Right Sideburn (well diff)   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   Left Sideburn (in situ)   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   Left Ala Nasi (well diff)   Seasonal allergies    Past Surgical History:  Procedure Laterality Date   CARDIOVERSION N/A 08/05/2021   Procedure: CARDIOVERSION;  Surgeon: Knox Perl, MD;  Location: [facility] ENDOSCOPY;  Service: Cardiovascular;  Laterality: N/A;   INGUINAL HERNIA REPAIR     right side (4-5 years ago)   mohs Right    Patient Active Problem List   Diagnosis Date Noted   Primary hypertension 02/12/2023   Elevated coronary artery calcium  score 02/12/2023   Mixed hyperlipidemia 02/12/2023   Dyspnea on exertion 01/12/2023   Decreased exercise tolerance 01/12/2023   Asthmatic bronchitis, moderate persistent, uncomplicated 09/24/2022   Atypical atrial flutter (HCC) 07/07/2021   Persistent atrial fibrillation (HCC) 07/07/2021   OSA (obstructive sleep apnea) 09/22/2013     REFERRING PROVIDER: Rance Burrows, MD  REFERRING DIAG:  [phone] (ICD-10-CM) - Pain in left knee  THERAPY DIAG:  Left knee pain, unspecified chronicity  Muscle weakness (generalized)  Rationale for Evaluation and Treatment: Rehabilitation  ONSET DATE: Mid March 2025  SUBJECTIVE:   SUBJECTIVE STATEMENT: 01/31/24 Pt reports he did yard work yesterday as well as 1 mile walk. Tried to increase his speed, but just couldn't.  "I took 2-3 intense walks last week".   Biggest complaint is tightness in L knee.  He'd like to know when he can return to [employer]) gym.   Initial evaluation:  7 weeks ago, he stood up from a chair in a restaurant and felt like he was going to roll his ankle.  He moved his knee quickly medially in order to not roll his ankle.  Pt didn't have pain at the time.  36 hours later, he noticed swelling in knee and down L LE.  Pt went to a walk-in clinic and had x rays.  MD suspected a Baker's cyst.  Pt continued to have swelling and had and had a venous doppler which indicated potential hematoma in calf per MD note.  Pt had no blood clot.  Pt had another set of x rays.  MD notes indicated x ray confimred arthritis on inside of knee, not severe enough for surgery.  Baker's cyst is attribulted to underlying arthritis with fluid accumulation ruptures during quick movements  causing pain and swelling down the leg.      He still has swelling in knee.  Pt is cautious with walking though did recently start his walking again.  Pt states he can limp with walking.  He walked 3.5 miles yesterday without adverse effects.  He c/o's of stiffness in bilat knees.  Pt is cautious on steps and has some difficulty with stairs.  He was able to climb a ladder. Pt is a [employer] member and has not been working out or playing pickleball due to L knee/LE sx's. "It feels good today."  PT order on 12/20/23 indicated Suspeced ruptured baker's cyst.  Work on L LE strength.  MD note indicated PT 6-8 weeks, once per week.  Activity as tolerated, reduce if pain exceeds  3/10.  Using a knee sleeve or compression sock for comfort.    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 Yes. Number of falls 1 while playing pickleball  LIVING ENVIRONMENT: Lives with: lives with their spouse Lives in: 1 story home Stairs: 3-4 steps to enter home with rail Has following equipment at home: None  OCCUPATION: Pt is retired.   PLOF: Independent  Pt was walking 3-4 miles per day.  Pt able to perform workout activities and play pickleball.   PATIENT GOALS: reduce swelling and improve strength in LE  NEXT MD VISIT: 02/09/24  OBJECTIVE:  Note: Objective measures were completed at Evaluation unless otherwise noted.  DIAGNOSTIC FINDINGS: MD notes indicated x ray confimred arthritis on inside of knee, not severe enough for surgery.  PATIENT SURVEYS:  LEFS 51/80  COGNITION: Overall cognitive status: Within functional limits for tasks assessed      EDEMA:  Pt has swelling in L knee.   Girth measurement at mid patella:  R:  38.3, L:  41  MUSCLE LENGTH: Pt is very tight in bilat HS   PALPATION: TTP in medial L knee at joint line.  Pt had no tenderness in other areas of knee including posterior knee.   LOWER EXTREMITY ROM:  AROM Right eval Left eval L 01/31/24  Hip flexion     Hip extension     Hip abduction     Hip adduction     Hip internal rotation     Hip external rotation     Knee flexion 132 122/130  AROM/PROM 124 arom  Knee extension 0 0   Ankle dorsiflexion     Ankle plantarflexion     Ankle inversion     Ankle eversion      (Blank rows = not tested)  LOWER EXTREMITY MMT:  MMT Right eval Left eval  Hip flexion 4+/5 5/5  Hip extension 4/5 4+/5  Hip abduction 5/5 5/5  Hip adduction    Hip internal rotation     Hip external rotation    Knee flexion 5/5 seated 5/5 setaed  Knee extension 5/5 4/5  Ankle dorsiflexion    Ankle plantarflexion    Ankle inversion    Ankle eversion     (Blank rows = not tested)    GAIT: Assistive device utilized: None Level of assistance: Complete Independence Comments: Pt has a heel to toe gait pattern without limping.  Increased toe out on R.  TREATMENT:   [facility] Adult PT Treatment:                                                DATE: 01/31/24  Therapeutic Exercise: Upright bike L2-5 x 6 min for warm up L heel slides x 5 (ROM up to 124) L/R SLR x 10 R/L piriformis stretch (fig 4 pulling knee to opp shoulder) x 30s (R tighter than L)  R/L hamstring stretch in hooklying x 15s x 2 reps each LE with strap Standing quad stretch with finger in shoe x 15s x 2 each LE  Standing L adductor stretch with hands on elevated table, hip hinge x 10-15s Seated hamstring stretch with cues to hold stretch longer (<5sec)  Therapeutic Activity: STS with forward arm reach, controlled descent to surface  x 5 -> in staggered stance x 5 each LE forward    PATIENT EDUCATION:  Education details: dx, relevant anatomy, objective findings, POC, HEP, rationale of interventions, and what to expect next Rx.  PT answered pt's questions.  Person educated: Patient Education method: Explanation, Demonstration, Tactile cues, Verbal cues, and Handouts Education comprehension: verbalized understanding, returned demonstration, verbal cues required, tactile cues required, and needs further education  HOME EXERCISE PROGRAM: Access Code: [email or web address] URL: [email or web address]  ASSESSMENT:  CLINICAL IMPRESSION: Pt reported reduction in tightness in L knee after bike.  Tolerated most exercises well, except STS with caused some irritation in both knees.   Updated HEP.  Advised pt to hold off on coming back to gym for at least one more week.  Therapist may trial some gym equipment next visit.  Advised pt to wear compression sleeve on knee when walking to assist with edema control/reduction.  Goals are ongoing.    From initial evaluation:  Patient is a 68 y.o. male with a dx of L knee pain.  MD note indicates pt having a suspected ruptured baker's cyst.  Pt has continued swelling in knee.  He is cautious with walking and performing stairs.  Pt reports difficulty with stairs and occasional limping with gait.  PT observed no limp today.  He c/o's of stiffness in bilat knees.  Pt has not been working out or playing pickleball due to L knee/LE sx's.  Pt's L knee flexion ROM is WFL though pt does have less ROM than R knee.  He has weakness in L quad and bilat hip extension.  Pt should benefit from skilled PT to address impairments and improve overall function.    OBJECTIVE IMPAIRMENTS: decreased activity tolerance, decreased mobility, difficulty walking, decreased ROM, decreased strength, hypomobility, increased edema, and pain.   ACTIVITY LIMITATIONS: stairs and locomotion level  PARTICIPATION LIMITATIONS: recreational/workout activities  PERSONAL FACTORS: 1 comorbidity: A-fib are also affecting patient's functional outcome.   REHAB POTENTIAL: Good  CLINICAL DECISION MAKING: Stable/uncomplicated  EVALUATION COMPLEXITY: Low   GOALS:  SHORT TERM GOALS: Target date: 02/15/2024  Pt will be independent and compliant with HEP for improved pain, strength, ROM, and function.  Baseline: Goal status: INITIAL  2.  Pt will report at least a 25% improvement in pain and sx's overall.  Baseline:  Goal status: INITIAL  3.  Pt's swelling will improve as evidenced by mid patella measurement being no > than 39 cm for improved mobility Baseline:  Goal status: INITIAL Target date:  02/22/2024    LONG TERM GOALS: Target  date: 03/21/2024  Pt will demo  improved L quad strength to 5/5 MMT for improved ambulation and functional mobility. Baseline:  Goal status: INITIAL  2.  Pt will demonstrate a reciprocal gait on stairs with good control with a rail.   Baseline:  Goal status: INITIAL  3.  Pt will perform his walking program without adverse effects.  Baseline:  Goal status: INITIAL  4.  Pt will be able to play pickleball without adverse effects.  Baseline:  Goal status: INITIAL   PLAN:  PT FREQUENCY: 1x/week  PT DURATION: 6-8 weeks  PLANNED INTERVENTIONS: 97164- PT Re-evaluation, 97750- Physical Performance Testing, 97110-Therapeutic exercises, 97530- Therapeutic activity, [id number]- Neuromuscular re-education, 97535- Self Care, 16109- Manual therapy, [id number]- Gait training, [phone]- Aquatic Therapy, [phone]- Vasopneumatic device, [phone]- Ultrasound, Patient/Family education, Balance training, Stair training, Taping, Dry Needling, Joint mobilization, Cryotherapy, and Moist heat  PLAN FOR NEXT SESSION: Review and perform HEP.  Monitor swelling.  Cont with L hip and knee strengthening per pt tolerance.    Almedia Jacobsen, PTA 01/31/24 11:27 AM [facility] [address] [place], [place], [address] Phone: [phone]   Fax:  [phone]   Referring diagnosis?  M25.562 (ICD-10-CM) - Pain in left knee   Treatment diagnosis? (if different than referring diagnosis)  Left knee pain, unspecified chronicity  Muscle weakness (generalized)  What was this (referring dx) caused by? []  Surgery []  Fall []  Ongoing issue []  Arthritis [x]  Other: ____________  Laterality: []  Rt [x]  Lt []  Both  Check all possible CPT codes:  *CHOOSE 10 OR LESS*    See Planned Interventions listed in the Plan section of the Evaluation.

## 2024-02-08 ENCOUNTER — Other Ambulatory Visit: Payer: Self-pay

## 2024-02-08 ENCOUNTER — Other Ambulatory Visit: Payer: Self-pay | Admitting: Internal Medicine

## 2024-02-08 DIAGNOSIS — H35033 Hypertensive retinopathy, bilateral: Secondary | ICD-10-CM | POA: Diagnosis not present

## 2024-02-08 DIAGNOSIS — H43822 Vitreomacular adhesion, left eye: Secondary | ICD-10-CM | POA: Diagnosis not present

## 2024-02-08 DIAGNOSIS — J4550 Severe persistent asthma, uncomplicated: Secondary | ICD-10-CM

## 2024-02-08 DIAGNOSIS — H43811 Vitreous degeneration, right eye: Secondary | ICD-10-CM | POA: Diagnosis not present

## 2024-02-08 DIAGNOSIS — Z961 Presence of intraocular lens: Secondary | ICD-10-CM | POA: Diagnosis not present

## 2024-02-08 MED ORDER — DUPIXENT 300 MG/2ML ~~LOC~~ SOAJ
300.0000 mg | SUBCUTANEOUS | 1 refills | Status: DC
  Filled 2024-02-08: qty 4, 28d supply, fill #0
  Filled 2024-03-07: qty 4, 28d supply, fill #1
  Filled 2024-04-03: qty 4, 28d supply, fill #2
  Filled 2024-04-26: qty 4, 28d supply, fill #3
  Filled 2024-05-31: qty 4, 28d supply, fill #4
  Filled 2024-06-26 – 2024-06-29 (×2): qty 4, 28d supply, fill #5

## 2024-02-08 NOTE — Progress Notes (Signed)
 Specialty Pharmacy Ongoing Clinical Assessment Note  Jeffery Underwood is a 68 y.o. male who is being followed by the specialty pharmacy service for RxSp Asthma/COPD   Patient's specialty medication(s) reviewed today: Dupilumab    Missed doses in the last 4 weeks: 0   Patient/Caregiver did not have any additional questions or concerns.   Therapeutic benefit summary: Patient is achieving benefit   Adverse events/side effects summary: No adverse events/side effects   Patient's therapy is appropriate to: Continue    Goals Addressed             This Visit's Progress    Maintain optimal adherence to therapy   On track    Patient is on track. Patient will maintain adherence and be monitored by provider to determine if a change in treatment plan is warranted.      Minimize recurrence of flares   On track    Patient is on track. Patient will avoid flare triggers and be monitored by provider to determine if a change in treatment plan is warranted. Patient stated that breathing is much better. Per office note from 5/13, provider is determining if cough is reflux related.         Follow up: 6 months  [facility]

## 2024-02-08 NOTE — Progress Notes (Signed)
 Specialty Pharmacy Refill Coordination Note  Jeffery Underwood is a 68 y.o. male contacted today regarding refills of specialty medication(s) Dupilumab    Patient requested Delivery   Delivery date: 02/10/24   Verified address: [address] [place] [address]   Medication will be filled on 02/09/24. This fill date is pending response to refill request from provider. Patient is aware and if they have not received fill by intended date they must follow up with pharmacy.

## 2024-02-08 NOTE — Telephone Encounter (Signed)
 Refill sent for DUPIXENT  to [facility]: [phone]   Dose: 300mg  subcut every 14 days  Last OV: 11/04/23 Provider: Dr. Dione Franks  Next OV: 6/17/205  Geraldene Kleine, PharmD, MPH, BCPS Clinical Pharmacist (Rheumatology and Pulmonology)

## 2024-02-09 ENCOUNTER — Other Ambulatory Visit: Payer: Self-pay

## 2024-02-09 DIAGNOSIS — M25562 Pain in left knee: Secondary | ICD-10-CM | POA: Diagnosis not present

## 2024-02-09 DIAGNOSIS — M7122 Synovial cyst of popliteal space [Baker], left knee: Secondary | ICD-10-CM | POA: Diagnosis not present

## 2024-02-11 ENCOUNTER — Encounter (HOSPITAL_BASED_OUTPATIENT_CLINIC_OR_DEPARTMENT_OTHER): Payer: Self-pay

## 2024-02-11 ENCOUNTER — Ambulatory Visit (HOSPITAL_BASED_OUTPATIENT_CLINIC_OR_DEPARTMENT_OTHER): Payer: Self-pay

## 2024-02-11 DIAGNOSIS — M25562 Pain in left knee: Secondary | ICD-10-CM

## 2024-02-11 DIAGNOSIS — M6281 Muscle weakness (generalized): Secondary | ICD-10-CM

## 2024-02-11 NOTE — Therapy (Signed)
 OUTPATIENT PHYSICAL THERAPY LOWER EXTREMITY TREATMENT   Patient Name: Jeffery Underwood MRN: [id number] DOB:08-Aug-1956, 68 y.o., male Today's Date: 02/11/2024  END OF SESSION:  PT End of Session - 02/11/24 0756     Visit Number 3    Number of Visits 8    Date for PT Re-Evaluation 03/21/24    Authorization Type [employer] MCR    PT Start Time 0808    PT Stop Time 0850    PT Time Calculation (min) 42 min    Activity Tolerance Patient tolerated treatment well    Behavior During Therapy [facility] for tasks assessed/performed             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   Right Anterior Neck   OSA (obstructive sleep apnea)    Prediabetes 09/21/2004   resolved 10/2011   Psoriasis    Recurrent upper respiratory infection (URI)    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   Right Sideburn (well diff)   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   Left Sideburn (in situ)   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   Left Ala Nasi (well diff)   Seasonal allergies    Past Surgical History:  Procedure Laterality Date   CARDIOVERSION N/A 08/05/2021   Procedure: CARDIOVERSION;  Surgeon: Knox Perl, MD;  Location: [facility] ENDOSCOPY;  Service: Cardiovascular;  Laterality: N/A;   INGUINAL HERNIA REPAIR     right side (4-5 years ago)   mohs Right    Patient Active Problem List   Diagnosis Date Noted   Primary hypertension 02/12/2023   Elevated coronary artery calcium  score 02/12/2023   Mixed hyperlipidemia 02/12/2023   Dyspnea on exertion 01/12/2023   Decreased exercise tolerance 01/12/2023   Asthmatic bronchitis, moderate persistent, uncomplicated 09/24/2022   Atypical atrial flutter (HCC) 07/07/2021   Persistent atrial fibrillation (HCC) 07/07/2021   OSA (obstructive sleep apnea) 09/22/2013      REFERRING PROVIDER: Rance Burrows, MD  REFERRING DIAG: [phone] (ICD-10-CM) - Pain in left knee  THERAPY DIAG:  Left knee pain, unspecified chronicity  Muscle weakness (generalized)  Rationale for Evaluation and Treatment: Rehabilitation  ONSET DATE: Mid March 2025  SUBJECTIVE:   SUBJECTIVE STATEMENT: 02/11/24 Pt reports seeing MD on Wednesday who wants him to keep wearing compression sock. Knee feels more tight than painful. Still swollen compared to R knee.   Initial evaluation:  7 weeks ago, he stood up from a chair in a restaurant and felt like he was going to roll his ankle.  He moved his knee quickly medially in order to not roll his ankle.  Pt didn't have pain at the time.  36 hours later, he noticed swelling in knee and down L LE.  Pt went to a walk-in clinic and had x rays.  MD suspected a Baker's cyst.  Pt continued to have swelling and had and had a venous doppler which indicated potential hematoma in calf per MD note.  Pt had no blood clot.  Pt had another set of x rays.  MD notes indicated x ray confimred arthritis on inside of knee, not severe enough for surgery.  Baker's cyst is attribulted to underlying arthritis with fluid accumulation ruptures during quick movements causing pain and swelling down the leg.      He still has  swelling in knee.  Pt is cautious with walking though did recently start his walking again.  Pt states he can limp with walking.  He walked 3.5 miles yesterday without adverse effects.  He c/o's of stiffness in bilat knees.  Pt is cautious on steps and has some difficulty with stairs.  He was able to climb a ladder. Pt is a [employer] member and has not been working out or playing pickleball due to L knee/LE sx's. "It feels good today."  PT order on 12/20/23 indicated Suspeced ruptured baker's cyst.  Work on L LE strength.  MD note indicated PT 6-8 weeks, once per week.  Activity as tolerated, reduce if pain exceeds 3/10.  Using a knee sleeve or  compression sock for comfort.    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 Yes. Number of falls 1 while playing pickleball  LIVING ENVIRONMENT: Lives with: lives with their spouse Lives in: 1 story home Stairs: 3-4 steps to enter home with rail Has following equipment at home: None  OCCUPATION: Pt is retired.   PLOF: Independent  Pt was walking 3-4 miles per day.  Pt able to perform workout activities and play pickleball.   PATIENT GOALS: reduce swelling and improve strength in LE  NEXT MD VISIT: 02/09/24  OBJECTIVE:  Note: Objective measures were completed at Evaluation unless otherwise noted.  DIAGNOSTIC FINDINGS: MD notes indicated x ray confimred arthritis on inside of knee, not severe enough for surgery.  PATIENT SURVEYS:  LEFS 51/80  COGNITION: Overall cognitive status: Within functional limits for tasks assessed      EDEMA:  Pt has swelling in L knee.   Girth measurement at mid patella:  R:  38.3, L:  41  MUSCLE LENGTH: Pt is very tight in bilat HS   PALPATION: TTP in medial L knee at joint line.  Pt had no tenderness in other areas of knee including posterior knee.   LOWER EXTREMITY ROM:  AROM Right eval Left eval L 01/31/24  Hip flexion     Hip extension     Hip abduction     Hip adduction     Hip internal rotation     Hip external rotation     Knee flexion 132 122/130  AROM/PROM 124 arom  Knee extension 0 0   Ankle dorsiflexion     Ankle plantarflexion     Ankle inversion     Ankle eversion      (Blank rows = not tested)  LOWER EXTREMITY MMT:  MMT Right eval Left eval  Hip flexion 4+/5 5/5  Hip extension 4/5 4+/5  Hip abduction 5/5 5/5  Hip adduction    Hip internal rotation    Hip external rotation     Knee flexion 5/5 seated 5/5 setaed  Knee extension 5/5 4/5  Ankle dorsiflexion    Ankle plantarflexion    Ankle inversion    Ankle eversion     (Blank rows = not tested)    GAIT: Assistive device utilized: None Level of assistance: Complete Independence Comments: Pt has a heel to toe gait pattern without limping.  Increased toe out on R.  TREATMENT:    [facility] Adult PT Treatment:                                                DATE: 02/11/24  Therapeutic Exercise: Performed elliptical prior to session x40min L knee PROM Incline stretch 30sec x3 Seated HSS x30sec L Seated piriformis stretch (fig 4 and pull across) Leg press 70# x10, 90# x10 HSC machine 45# 2x10 Knee extension machine 25# 2x10 Hip abduction machine 55# 3x10 L/R SLR x 15 Review of gym program HEP update Review of STS technique Supine piriformis stretch (fot HEP)        [facility] Adult PT Treatment:                                                DATE: 01/31/24  Therapeutic Exercise: Upright bike L2-5 x 6 min for warm up L heel slides x 5 (ROM up to 124) L/R SLR x 10 R/L piriformis stretch (fig 4 pulling knee to opp shoulder) x 30s (R tighter than L)  R/L hamstring stretch in hooklying x 15s x 2 reps each LE with strap Standing quad stretch with finger in shoe x 15s x 2 each LE  Standing L adductor stretch with hands on elevated table, hip hinge x 10-15s Seated hamstring stretch with cues to hold stretch longer (<5sec)  Therapeutic Activity: STS with forward arm reach, controlled descent to surface  x 5 -> in staggered stance x 5 each LE forward    PATIENT EDUCATION:  Education details: dx, relevant anatomy, objective findings, POC, HEP, rationale of interventions, and what to expect next Rx.  PT answered pt's questions.  Person educated: Patient Education method: Explanation,  Demonstration, Tactile cues, Verbal cues, and Handouts Education comprehension: verbalized understanding, returned demonstration, verbal cues required, tactile cues required, and needs further education  HOME EXERCISE PROGRAM: Access Code: [email or web address] URL: [email or web address]  ASSESSMENT:  CLINICAL IMPRESSION: Instructed pt in stretching and strengthening program he can perform while at [facility]. Provided gym sheet with appropriate machines and corresponding reps. He was instructed to modify resistance as needed depending on knee discomfort/swelling. Discussed exercise intensity and modifications as he returns to gentle work outs. Discussed HEP frequency and self progression. Reviewed STS technique at pt request. Added piriformis stretching to HEP. Instructed to continue with use of ice after activity to manage swelling.   From initial evaluation:  Patient is a 68 y.o. male with a dx of L knee pain.  MD note indicates pt having a suspected ruptured baker's cyst.  Pt has continued swelling in knee.  He is cautious with walking and performing stairs.  Pt reports difficulty with stairs and occasional limping with gait.  PT observed no limp today.  He c/o's of stiffness in bilat knees.  Pt has not been working out or playing pickleball due to L knee/LE sx's.  Pt's L knee flexion ROM is WFL though pt does have less ROM than R knee.  He has weakness in L quad and bilat hip extension.  Pt should benefit from skilled PT to address impairments and improve overall function.    OBJECTIVE IMPAIRMENTS: decreased activity tolerance, decreased mobility, difficulty walking, decreased ROM, decreased strength, hypomobility, increased edema, and pain.  ACTIVITY LIMITATIONS: stairs and locomotion level  PARTICIPATION LIMITATIONS: recreational/workout activities  PERSONAL FACTORS: 1 comorbidity: A-fib are also affecting patient's functional outcome.   REHAB POTENTIAL: Good  CLINICAL DECISION  MAKING: Stable/uncomplicated  EVALUATION COMPLEXITY: Low   GOALS:  SHORT TERM GOALS: Target date: 02/15/2024  Pt will be independent and compliant with HEP for improved pain, strength, ROM, and function.  Baseline: Goal status: INITIAL  2.  Pt will report at least a 25% improvement in pain and sx's overall.  Baseline:  Goal status: INITIAL  3.  Pt's swelling will improve as evidenced by mid patella measurement being no > than 39 cm for improved mobility Baseline:  Goal status: INITIAL Target date:  02/22/2024    LONG TERM GOALS: Target date: 03/21/2024  Pt will demo improved L quad strength to 5/5 MMT for improved ambulation and functional mobility. Baseline:  Goal status: INITIAL  2.  Pt will demonstrate a reciprocal gait on stairs with good control with a rail.   Baseline:  Goal status: INITIAL  3.  Pt will perform his walking program without adverse effects.  Baseline:  Goal status: INITIAL  4.  Pt will be able to play pickleball without adverse effects.  Baseline:  Goal status: INITIAL   PLAN:  PT FREQUENCY: 1x/week  PT DURATION: 6-8 weeks  PLANNED INTERVENTIONS: 97164- PT Re-evaluation, 97750- Physical Performance Testing, 97110-Therapeutic exercises, 97530- Therapeutic activity, [id number]- Neuromuscular re-education, 97535- Self Care, [id number]- Manual therapy, [id number]- Gait training, [phone]- Aquatic Therapy, [phone]- Vasopneumatic device, [phone]- Ultrasound, Patient/Family education, Balance training, Stair training, Taping, Dry Needling, Joint mobilization, Cryotherapy, and Moist heat  PLAN FOR NEXT SESSION: Review and perform HEP.  Monitor swelling.  Cont with L hip and knee strengthening per pt tolerance.    Herb Loges, PTA  02/11/24 9:19 AM [facility] [address] [place], [place], [address] Phone: [phone]   Fax:  [phone]   Referring diagnosis?  M25.562 (ICD-10-CM) - Pain in left  knee   Treatment diagnosis? (if different than referring diagnosis)  Left knee pain, unspecified chronicity  Muscle weakness (generalized)  What was this (referring dx) caused by? []  Surgery []  Fall []  Ongoing issue []  Arthritis [x]  Other: ____________  Laterality: []  Rt [x]  Lt []  Both  Check all possible CPT codes:  *CHOOSE 10 OR LESS*    See Planned Interventions listed in the Plan section of the Evaluation.

## 2024-02-17 ENCOUNTER — Ambulatory Visit (HOSPITAL_BASED_OUTPATIENT_CLINIC_OR_DEPARTMENT_OTHER): Admitting: Physical Therapy

## 2024-02-17 ENCOUNTER — Encounter (HOSPITAL_BASED_OUTPATIENT_CLINIC_OR_DEPARTMENT_OTHER): Payer: Self-pay | Admitting: Physical Therapy

## 2024-02-17 DIAGNOSIS — M6281 Muscle weakness (generalized): Secondary | ICD-10-CM | POA: Diagnosis not present

## 2024-02-17 DIAGNOSIS — M25562 Pain in left knee: Secondary | ICD-10-CM | POA: Diagnosis not present

## 2024-02-17 NOTE — Therapy (Signed)
 OUTPATIENT PHYSICAL THERAPY LOWER EXTREMITY TREATMENT   Patient Name: Jeffery Underwood MRN: [id number] DOB:01-17-1956, 68 y.o., male Today's Date: 02/17/2024  END OF SESSION:  PT End of Session - 02/17/24 1624     Visit Number 4    Number of Visits 8    Date for PT Re-Evaluation 03/21/24    Authorization Type [employer] MCR    PT Start Time 1620    PT Stop Time 1658    PT Time Calculation (min) 38 min    Behavior During Therapy WFL for tasks assessed/performed             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   Right Anterior Neck   OSA (obstructive sleep apnea)    Prediabetes 09/21/2004   resolved 10/2011   Psoriasis    Recurrent upper respiratory infection (URI)    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   Right Sideburn (well diff)   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   Left Sideburn (in situ)   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   Left Ala Nasi (well diff)   Seasonal allergies    Past Surgical History:  Procedure Laterality Date   CARDIOVERSION N/A 08/05/2021   Procedure: CARDIOVERSION;  Surgeon: Knox Perl, MD;  Location: [facility] ENDOSCOPY;  Service: Cardiovascular;  Laterality: N/A;   INGUINAL HERNIA REPAIR     right side (4-5 years ago)   mohs Right    Patient Active Problem List   Diagnosis Date Noted   Primary hypertension 02/12/2023   Elevated coronary artery calcium  score 02/12/2023   Mixed hyperlipidemia 02/12/2023   Dyspnea on exertion 01/12/2023   Decreased exercise tolerance 01/12/2023   Asthmatic bronchitis, moderate persistent, uncomplicated 09/24/2022   Atypical atrial flutter (HCC) 07/07/2021   Persistent atrial fibrillation (HCC) 07/07/2021   OSA (obstructive sleep apnea) 09/22/2013     REFERRING PROVIDER: Rance Burrows, MD  REFERRING DIAG:  [phone] (ICD-10-CM) - Pain in left knee  THERAPY DIAG:  Left knee pain, unspecified chronicity  Muscle weakness (generalized)  Rationale for Evaluation and Treatment: Rehabilitation  ONSET DATE: Mid March 2025  SUBJECTIVE:   SUBJECTIVE STATEMENT: 02/17/24 Pt reports 25% improvement in symptoms since starting therapy.  He is walking 4 miles every other day, "with purpose". Pt will be out of town for next two weeks.  Hopes to play pickle ball in June.   Initial evaluation:  7 weeks ago, he stood up from a chair in a restaurant and felt like he was going to roll his ankle.  He moved his knee quickly medially in order to not roll his ankle.  Pt didn't have pain at the time.  36 hours later, he noticed swelling in knee and down L LE.  Pt went to a walk-in clinic and had x rays.  MD suspected a Baker's cyst.  Pt continued to have swelling and had and had a venous doppler which indicated potential hematoma in calf per MD note.  Pt had no blood clot.  Pt had another set of x rays.  MD notes indicated x ray confimred arthritis on inside of knee, not severe enough for surgery.  Baker's cyst is attribulted to underlying arthritis with fluid accumulation ruptures during quick movements causing pain and swelling down the leg.  He still has swelling in knee.  Pt is cautious with walking though did recently start his walking again.  Pt states he can limp with walking.  He walked 3.5 miles yesterday without adverse effects.  He c/o's of stiffness in bilat knees.  Pt is cautious on steps and has some difficulty with stairs.  He was able to climb a ladder. Pt is a [employer] member and has not been working out or playing pickleball due to L knee/LE sx's. "It feels good today."  PT order on 12/20/23 indicated Suspeced ruptured baker's cyst.  Work on L LE strength.  MD note indicated PT 6-8 weeks, once per week.  Activity as tolerated, reduce if pain exceeds 3/10.  Using a knee sleeve or compression sock for  comfort.    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 Yes. Number of falls 1 while playing pickleball  LIVING ENVIRONMENT: Lives with: lives with their spouse Lives in: 1 story home Stairs: 3-4 steps to enter home with rail Has following equipment at home: None  OCCUPATION: Pt is retired.   PLOF: Independent  Pt was walking 3-4 miles per day.  Pt able to perform workout activities and play pickleball.   PATIENT GOALS: reduce swelling and improve strength in LE  NEXT MD VISIT: 02/09/24  OBJECTIVE:  Note: Objective measures were completed at Evaluation unless otherwise noted.  DIAGNOSTIC FINDINGS: MD notes indicated x ray confimred arthritis on inside of knee, not severe enough for surgery.  PATIENT SURVEYS:  LEFS 51/80  COGNITION: Overall cognitive status: Within functional limits for tasks assessed      EDEMA:  Pt has swelling in L knee.   Girth measurement at mid patella:  R:  38.3, L:  41  02/17/24   L: 40cm  MUSCLE LENGTH: Pt is very tight in bilat HS   PALPATION: TTP in medial L knee at joint line.  Pt had no tenderness in other areas of knee including posterior knee.   LOWER EXTREMITY ROM:  AROM Right eval Left eval L 01/31/24  Hip flexion     Hip extension     Hip abduction     Hip adduction     Hip internal rotation     Hip external rotation     Knee flexion 132 122/130  AROM/PROM 124 arom  Knee extension 0 0   Ankle dorsiflexion     Ankle plantarflexion     Ankle inversion     Ankle eversion      (Blank rows = not tested)  LOWER EXTREMITY MMT:  MMT Right eval Left eval  Hip flexion 4+/5 5/5  Hip extension 4/5 4+/5  Hip abduction 5/5 5/5  Hip adduction    Hip internal rotation    Hip external rotation     Knee flexion 5/5 seated 5/5 setaed  Knee extension 5/5 4/5  Ankle dorsiflexion    Ankle plantarflexion    Ankle inversion    Ankle eversion     (Blank rows = not tested)    GAIT: Assistive device utilized: None Level of assistance: Complete Independence Comments: Pt has a heel to toe gait pattern without limping.  Increased toe out on R.  TREATMENT:   [facility] Adult PT Treatment:                                                DATE: 02/17/24  Therapeutic Exercise: (Performed elliptical prior to session x - no increase in pain) STS with cues for increased hip hinge x 8     Cybex Leg press 90# x10, 95# 2 x 10   LF  hamstring curl machine 55# x10;  (#60 x 8, ->too much)-- 45-50# is good weight   LF hip addct 70# x 10  Hip abdct machine 70# x 10 Incline stretch 30sec x 2;  soleus stretch on incline x 15s Squats on incline board x 5 Single leg bridge x 10 each LE Modified pigeon pose for piriformis x 15s x 2 Seated HSS 2 x 30 sec L   [facility] Adult PT Treatment:                                                DATE: 02/11/24  Therapeutic Exercise: Performed elliptical prior to session x84min L knee PROM Incline stretch 30sec x3 Seated HSS x30sec L Seated piriformis stretch (fig 4 and pull across) Leg press 70# x10, 90# x10 HSC machine 45# 2x10 Knee extension machine 25# 2x10 Hip abduction machine 55# 3x10 L/R SLR x 15 Review of gym program HEP update Review of STS technique Supine piriformis stretch (fot HEP)        [facility] Adult PT Treatment:                                                DATE: 01/31/24  Therapeutic Exercise: Upright bike L2-5 x 6 min for warm up L heel slides x 5 (ROM up to 124) L/R SLR x 10 R/L piriformis stretch (fig 4 pulling knee to opp shoulder) x 30s (R tighter than L)  R/L hamstring stretch in hooklying x 15s x 2  reps each LE with strap Standing quad stretch with finger in shoe x 15s x 2 each LE  Standing L adductor stretch with hands on elevated table, hip hinge x 10-15s Seated hamstring stretch with cues to hold stretch longer (<5sec)  Therapeutic Activity: STS with forward arm reach, controlled descent to surface  x 5 -> in staggered stance x 5 each LE forward    PATIENT EDUCATION:  Education details: HEP, rationale of interventions,  Person educated: Patient Education method: Explanation, Demonstration, Tactile cues, Verbal cues, and Handouts Education comprehension: verbalized understanding, returned demonstration, verbal cues required, tactile cues required, and needs further education  HOME EXERCISE PROGRAM: Access Code: [email or web address] URL: [email or web address]  ASSESSMENT:  CLINICAL IMPRESSION: Reviewed STS technique at pt request; given cues for increased hip hinge and controlled descent. Updated HEP with single leg bridge and modified pigeon pose for piriformis stretch.  Pt tolerated session well without increase in pain, just fatigue.  Pt has met STG 2 and is progressing well towards remaining goals..    From initial evaluation:  Patient is a 68 y.o. male with a dx of L knee pain.  MD note indicates pt  having a suspected ruptured baker's cyst.  Pt has continued swelling in knee.  He is cautious with walking and performing stairs.  Pt reports difficulty with stairs and occasional limping with gait.  PT observed no limp today.  He c/o's of stiffness in bilat knees.  Pt has not been working out or playing pickleball due to L knee/LE sx's.  Pt's L knee flexion ROM is WFL though pt does have less ROM than R knee.  He has weakness in L quad and bilat hip extension.  Pt should benefit from skilled PT to address impairments and improve overall function.    OBJECTIVE IMPAIRMENTS: decreased activity tolerance, decreased mobility, difficulty walking, decreased ROM, decreased strength,  hypomobility, increased edema, and pain.   ACTIVITY LIMITATIONS: stairs and locomotion level  PARTICIPATION LIMITATIONS: recreational/workout activities  PERSONAL FACTORS: 1 comorbidity: A-fib are also affecting patient's functional outcome.   REHAB POTENTIAL: Good  CLINICAL DECISION MAKING: Stable/uncomplicated  EVALUATION COMPLEXITY: Low   GOALS:  SHORT TERM GOALS: Target date: 02/15/2024  Pt will be independent and compliant with HEP for improved pain, strength, ROM, and function.  Baseline: Goal status: INITIAL  2.  Pt will report at least a 25% improvement in pain and sx's overall.  Baseline:  Goal status: MET - 02/17/24  3.  Pt's swelling will improve as evidenced by mid patella measurement being no > than 39 cm for improved mobility Baseline: see above  Goal status: In progress - 02/17/24 Target date:  02/22/2024    LONG TERM GOALS: Target date: 03/21/2024  Pt will demo improved L quad strength to 5/5 MMT for improved ambulation and functional mobility. Baseline:  Goal status: INITIAL  2.  Pt will demonstrate a reciprocal gait on stairs with good control with a rail.   Baseline:  Goal status: INITIAL  3.  Pt will perform his walking program without adverse effects.  Baseline:  Goal status: INITIAL  4.  Pt will be able to play pickleball without adverse effects.  Baseline:  Goal status: INITIAL   PLAN:  PT FREQUENCY: 1x/week  PT DURATION: 6-8 weeks  PLANNED INTERVENTIONS: 97164- PT Re-evaluation, 97750- Physical Performance Testing, 97110-Therapeutic exercises, 97530- Therapeutic activity, [id number]- Neuromuscular re-education, 97535- Self Care, 65784- Manual therapy, [id number]- Gait training, [phone]- Aquatic Therapy, [phone]- Vasopneumatic device, [phone]- Ultrasound, Patient/Family education, Balance training, Stair training, Taping, Dry Needling, Joint mobilization, Cryotherapy, and Moist heat  PLAN FOR NEXT SESSION: Review and perform HEP.  Monitor swelling.  Cont  with L hip and knee strengthening per pt tolerance.    Almedia Jacobsen, PTA 02/17/24 5:19 PM [facility] [address] [place], [place], [address] Phone: [phone]   Fax:  [phone]    Referring diagnosis?  M25.562 (ICD-10-CM) - Pain in left knee   Treatment diagnosis? (if different than referring diagnosis)  Left knee pain, unspecified chronicity  Muscle weakness (generalized)  What was this (referring dx) caused by? []  Surgery []  Fall []  Ongoing issue []  Arthritis [x]  Other: ____________  Laterality: []  Rt [x]  Lt []  Both  Check all possible CPT codes:  *CHOOSE 10 OR LESS*    See Planned Interventions listed in the Plan section of the Evaluation.

## 2024-02-21 ENCOUNTER — Other Ambulatory Visit: Payer: Self-pay | Admitting: Cardiology

## 2024-02-21 DIAGNOSIS — I4819 Other persistent atrial fibrillation: Secondary | ICD-10-CM

## 2024-02-21 NOTE — Telephone Encounter (Signed)
 Prescription refill request for Eliquis  received. Indication:afib Last office visit:3/25 Scr:1.13  10/24 Age: 68 Weight:91.1  kg  Prescription refilled

## 2024-03-07 ENCOUNTER — Other Ambulatory Visit: Payer: Self-pay

## 2024-03-07 ENCOUNTER — Encounter (INDEPENDENT_AMBULATORY_CARE_PROVIDER_SITE_OTHER): Payer: Self-pay

## 2024-03-07 ENCOUNTER — Encounter: Payer: Self-pay | Admitting: Internal Medicine

## 2024-03-07 ENCOUNTER — Ambulatory Visit (INDEPENDENT_AMBULATORY_CARE_PROVIDER_SITE_OTHER): Payer: Medicare PPO | Admitting: Internal Medicine

## 2024-03-07 ENCOUNTER — Other Ambulatory Visit (HOSPITAL_COMMUNITY): Payer: Self-pay

## 2024-03-07 ENCOUNTER — Telehealth: Payer: Self-pay

## 2024-03-07 VITALS — BP 112/60 | HR 64 | Ht 70.0 in | Wt 188.2 lb

## 2024-03-07 DIAGNOSIS — T464X5A Adverse effect of angiotensin-converting-enzyme inhibitors, initial encounter: Secondary | ICD-10-CM

## 2024-03-07 DIAGNOSIS — R053 Chronic cough: Secondary | ICD-10-CM | POA: Diagnosis not present

## 2024-03-07 DIAGNOSIS — K219 Gastro-esophageal reflux disease without esophagitis: Secondary | ICD-10-CM

## 2024-03-07 DIAGNOSIS — J302 Other seasonal allergic rhinitis: Secondary | ICD-10-CM

## 2024-03-07 MED ORDER — VALSARTAN 40 MG PO TABS: 40.0000 mg | ORAL_TABLET | Freq: Every day | ORAL | 3 refills | Status: AC

## 2024-03-07 MED ORDER — VALSARTAN 40 MG PO TABS: 40.0000 mg | ORAL_TABLET | Freq: Every day | ORAL | 0 refills | Status: DC

## 2024-03-07 NOTE — Telephone Encounter (Signed)
-----   Message from [facility] sent at 03/07/2024  3:04 PM EDT ----- Yes that's okay with me.  Ruthann Cover,. Can you send refills for Valsartan 40 mg please?  Thanks MJP ----- Message ----- From: Aleck Hurdle, MD Sent: 03/07/2024   1:27 PM EDT To: Cody Das, MD  Hello! Trial him off his ace-I for cough. Insurance seemed to like valsartan so I stared him on 40. Is that ok with you? If so, do you mind sending refills to his mail order pharmacy? Thanks! - [employer]

## 2024-03-07 NOTE — Progress Notes (Signed)
 Jeffery Underwood    [id number]    [birth year]  Primary Care Physician:Wolters, Genevia Kern, MD Date of Appointment: 03/07/2024 Established Patient Visit  Chief complaint:   Chief Complaint  Patient presents with   Follow-up    Patient is still having productive cough.      HPI: Jeffery Underwood is a 68 y.o. 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 Additionally has OSA on CPAP.  Started dupixent  Nov 2024.    Interval Updates: Here for asthma follow up. At last visit started PPI for GERD after esophagram showed dysmotility and reflux.   Does not feel trial of PPI has helped the cough. Coughing about ten times a day with sputum.   Cough is not associated with exertion, shortness of breath.   Drinks 1 cup one cup of coffee per day and cut back on alcohol from 2-3 times/day to 2-3 times/week.  Cough is not waking him up at night.   Denies sinus drainage, congestion. He has been on lisinopril  for about 3 years.   For the last 3 weeks he has had a slight runny nose.   He does not feel his asthma symptoms are related to his coughing.    Has occasional joint pains and cannot tell if they are related to dupixent  or not.    Current Regimen: advair 230 2 puffs BID, albuterol  prn, dupixent . Asthma Triggers:URIs, seasonal allergies Exacerbations in the last year:5-6 times in 2024, nothing since Nov 2024 starting dupixent  History of hospitalization or intubation: never Allergy  Testing: sees Dr. Lydia Sams in allergy  -  GERD: denies Allergic Rhinitis: flonase , zyrtec , singulair , yes takes regularly.  ACT:  Asthma Control Test ACT Total Score  11/04/2023  8:50 AM 20  08/04/2023  8:36 AM 23  05/03/2023 11:07 AM 25   FeNO:  I have reviewed the patient's family social and past medical history and updated as appropriate.   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   Left Ala Nasi (well diff)   Seasonal allergies     Past Surgical History:  Procedure Laterality Date   CARDIOVERSION N/A 08/05/2021   Procedure: CARDIOVERSION;  Surgeon: Knox Perl, MD;  Location: [facility] ENDOSCOPY;  Service: Cardiovascular;  Laterality: N/A;   INGUINAL HERNIA REPAIR     right side (4-5 years ago)   mohs Right     Family History  Problem Relation Age of Onset   Asthma Mother    Depression Mother    Tuberculosis Mother    Osteoporosis Mother    Diabetes type II Father    Atrial fibrillation Father     Social History   Occupational History   Not on file  Tobacco Use   Smoking status: Never   Smokeless tobacco: Never  Vaping Use   Vaping status: Never Used  Substance and Sexual Activity  Alcohol use: Yes    Alcohol/week: 14.0 standard drinks of alcohol    Types: 14 Cans of beer per week    Comment: 3-5 per week   Drug use: No   Sexual activity: Yes     Physical Exam: Blood pressure 112/60, pulse 64, height 5' 10 (1.778 m), weight 188 lb 3.2 oz (85.4 kg), SpO2 95%.  Gen:      No acute distress, occasional cough ENT: mild nasal debris, +cobblestoning, no nasal polyps Lungs:    ctab no wheeze CV:        RRR no mrg   Data Reviewed: Imaging: I have personally reviewed the CT Cardiac scoring - visualized lung windows are clear  PFTs:      No data to display         I have personally reviewed the patient's PFTs and spirometry Nov 2023 -  moderate airflow limitation  Labs: No  peripheral eosinophilia SPT - dust mites, cat  Barium esophagram Jan 2025 - Prominent cricopharyngeal bar. Mild dysmotility. Small hiatal hernia. Mild spontaneous reflux observed to the level of the mid esophagus. 13 mm barium tablet became stuck at the GE junction.  Immunization status: Immunization History  Administered Date(s) Administered   Influenza-Unspecified 06/29/2022   Moderna Covid-19 Vaccine Bivalent Booster 9yrs & up 07/27/2021   PFIZER(Purple Top)SARS-COV-2 Vaccination 11/25/2019, 12/16/2019, 06/26/2020, 12/23/2020    External Records Personally Reviewed: allergy , pulmonary  Assessment:  Moderate persistent asthma, not well controlled dependent on systemic steroids, now on dupixent  Chronic allergic rhinitis GERD not well controlled Chronic cough On Ace-Inhibitor Plan/Recommendations:  Sorry to hear that your cough is persistent.  I think your lisinopril  could be to blame for the cough.  I am switching you to a medication called valsartan 40 mg once daily.  I have sent this to your pharmacy and let Dr. Filiberto Hug know.  I will let his office take over refills.  I still think you have reflux symptoms.  If stopping the ACE inhibitor lisinopril  does not help your cough we may need to send you to a gastroenterologist.  Continue the omeprazole  for now.  For asthma and allergies:  continue the advair 230 2 puffs twice daily, gargle after use. continue albuterol  as needed.  continue the flonase , zyrtec , allegra, montelukast  Continue dupixent   Return to Care: Return in about 3 months (around 06/07/2024).   Jeffery Rover, MD Pulmonary and Critical Care Medicine [facility] Office:[phone]

## 2024-03-07 NOTE — Patient Instructions (Addendum)
 It was a pleasure to see you today!  Please schedule follow up with myself in 3 months.  If my schedule is not open yet, we will contact you with a reminder closer to that time. Please call [phone] if you haven't heard from us  a month before, and always call us  sooner if issues or concerns arise. You can also send us  a message through MyChart, but but aware that this is not to be used for urgent issues and it may take up to 5-7 days to receive a reply. Please be aware that you will likely be able to view your results before I have a chance to respond to them. Please give us  5 business days to respond to any non-urgent results.    Sorry to hear that your cough is persistent.  I think your lisinopril  could be to blame for the cough.  I am switching you to a medication called valsartan 40 mg once daily.  I have sent this to your pharmacy and let Dr. Filiberto Hug know.  I will let his office take over refills.  I still think you have reflux symptoms.  If stopping the ACE inhibitor lisinopril  does not help your cough we may need to send you to a gastroenterologist.  Continue the omeprazole  for now.  For asthma and allergies:  continue the advair 230 2 puffs twice daily, gargle after use. continue albuterol  as needed.  continue the flonase , zyrtec , allegra, montelukast  Continue dupixent 

## 2024-03-07 NOTE — Telephone Encounter (Signed)
 Left message advising

## 2024-03-07 NOTE — Telephone Encounter (Signed)
 Prescription sent in today to [facility].

## 2024-03-07 NOTE — Progress Notes (Signed)
 Specialty Pharmacy Refill Coordination Note  MyChart Questionnaire Submission  Jeffery Underwood is a 68 y.o. male contacted today regarding refills of specialty medication(s) No data recorded  Injection date: 03/15/24.   Patient requested: (Patient-Rptd) Delivery   Delivery date: 03/09/24 Verified address: (Patient-Rptd) [address]   [place]. [phone]  Medication will be filled on 03/08/24.

## 2024-03-09 ENCOUNTER — Encounter (HOSPITAL_BASED_OUTPATIENT_CLINIC_OR_DEPARTMENT_OTHER): Payer: Self-pay | Admitting: Physical Therapy

## 2024-03-09 ENCOUNTER — Ambulatory Visit (HOSPITAL_BASED_OUTPATIENT_CLINIC_OR_DEPARTMENT_OTHER): Attending: Sports Medicine | Admitting: Physical Therapy

## 2024-03-09 DIAGNOSIS — M6281 Muscle weakness (generalized): Secondary | ICD-10-CM | POA: Diagnosis not present

## 2024-03-09 DIAGNOSIS — M25562 Pain in left knee: Secondary | ICD-10-CM | POA: Diagnosis not present

## 2024-03-09 NOTE — Therapy (Signed)
 OUTPATIENT PHYSICAL THERAPY LOWER EXTREMITY TREATMENT   Patient Name: Jeffery Underwood MRN: [id number] DOB:1955-09-26, 68 y.o., male Today's Date: 03/10/2024  END OF SESSION:  PT End of Session - 03/09/24 1244     Visit Number 5    Number of Visits 8    Date for PT Re-Evaluation 03/21/24    Authorization Type [employer] MCR    PT Start Time 1200    PT Stop Time 1241    PT Time Calculation (min) 41 min    Activity Tolerance Patient tolerated treatment well    Behavior During Therapy WFL for tasks assessed/performed           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   Right Anterior Neck   OSA (obstructive sleep apnea)    Prediabetes 09/21/2004   resolved 10/2011   Psoriasis    Recurrent upper respiratory infection (URI)    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   Right Sideburn (well diff)   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   Left Sideburn (in situ)   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   Left Ala Nasi (well diff)   Seasonal allergies    Past Surgical History:  Procedure Laterality Date   CARDIOVERSION N/A 08/05/2021   Procedure: CARDIOVERSION;  Surgeon: Knox Perl, MD;  Location: [facility] ENDOSCOPY;  Service: Cardiovascular;  Laterality: N/A;   INGUINAL HERNIA REPAIR     right side (4-5 years ago)   mohs Right    Patient Active Problem List   Diagnosis Date Noted   Primary hypertension 02/12/2023   Elevated coronary artery calcium  score 02/12/2023   Mixed hyperlipidemia 02/12/2023   Dyspnea on exertion 01/12/2023   Decreased exercise tolerance 01/12/2023   Asthmatic bronchitis, moderate persistent, uncomplicated 09/24/2022   Atypical atrial flutter (HCC) 07/07/2021   Persistent atrial fibrillation (HCC) 07/07/2021   OSA (obstructive sleep apnea) 09/22/2013      REFERRING PROVIDER: Rance Burrows, MD  REFERRING DIAG: [phone] (ICD-10-CM) - Pain in left knee  THERAPY DIAG:  Left knee pain, unspecified chronicity  Muscle weakness (generalized)  Rationale for Evaluation and Treatment: Rehabilitation  ONSET DATE: Mid March 2025  SUBJECTIVE:   SUBJECTIVE STATEMENT: Pt has increased the amount of PT exercises.  Pt is walking 5 miles nearly everyday without increased L knee pain.  He had 3/10 pain before his walk yesterday and his pain went away with walking.  Pt still has swelling though there are no specific act's that increase swelling.  Pt denies any adverse effects after prior Rx.  Pt denies pain currently.         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 Yes. Number of falls 1 while playing pickleball  LIVING ENVIRONMENT: Lives with: lives with their spouse Lives in: 1 story home Stairs: 3-4 steps to enter home with rail Has following equipment at home: None  OCCUPATION: Pt is retired.   PLOF: Independent  Pt was walking 3-4 miles per day.  Pt able to perform workout activities and play pickleball.   PATIENT GOALS:  reduce swelling and improve strength in LE  NEXT MD VISIT: 02/09/24  OBJECTIVE:  Note: Objective measures were completed at Evaluation unless otherwise noted.  DIAGNOSTIC FINDINGS: MD notes indicated x ray confimred arthritis on inside of knee, not severe enough for surgery.  PATIENT SURVEYS:  LEFS 51/80  COGNITION: Overall cognitive status: Within functional limits for tasks assessed      EDEMA:  Pt has swelling in L knee.   Girth measurement at mid patella:  R:  38.3, L:  41  02/17/24   L: 40cm  6/19:  40.3 cm  MUSCLE LENGTH: Pt is very tight in bilat  HS   PALPATION: TTP in medial L knee at joint line.  Pt had no tenderness in other areas of knee including posterior knee.   LOWER EXTREMITY ROM:  AROM Right eval Left eval L 01/31/24  Hip flexion     Hip extension     Hip abduction     Hip adduction     Hip internal rotation     Hip external rotation     Knee flexion 132 122/130  AROM/PROM 124 arom  Knee extension 0 0   Ankle dorsiflexion     Ankle plantarflexion     Ankle inversion     Ankle eversion      (Blank rows = not tested)  LOWER EXTREMITY MMT:  MMT Right eval Left eval  Hip flexion 4+/5 5/5  Hip extension 4/5 4+/5  Hip abduction 5/5 5/5  Hip adduction    Hip internal rotation    Hip external rotation    Knee flexion 5/5 seated 5/5 setaed  Knee extension 5/5 4/5  Ankle dorsiflexion    Ankle plantarflexion    Ankle inversion    Ankle eversion     (Blank rows = not tested)    GAIT: Assistive device utilized: None Level of assistance: Complete Independence Comments: Pt has a heel to toe gait pattern without limping.  Increased toe out on R.                                                                                                                                  TREATMENT:   03/09/2024  Pt already warmed up on the elliptical for 6-7 mins prior to session.  Supine SL bridge 2x10 bilat Mini squats 2x10 with UE support on counter Cybex leg press 90# x10, 100# x10, 95# x10 LF seated leg curl 45#2x10 LF knee extension 25# 2x10 LF hip abd 55#, 65# 2x10 Seated HS stretch x30 sec bilat Supine manual HS stretch 2x30 sec bilat   PT answered pt's questions.    [facility] Adult PT Treatment:  DATE: 02/17/24  Therapeutic Exercise: (Performed elliptical prior to session x - no increase in pain) STS with cues for increased hip hinge x 8     Cybex Leg press 90# x10, 95# 2 x 10   LF  hamstring curl machine 55# x10;  (#60 x 8, ->too much)-- 45-50# is  good weight   LF hip addct 70# x 10  Hip abdct machine 70# x 10 Incline stretch 30sec x 2;  soleus stretch on incline x 15s Squats on incline board x 5 Single leg bridge x 10 each LE Modified pigeon pose for piriformis x 15s x 2 Seated HSS 2 x 30 sec L   [facility] Adult PT Treatment:                                                DATE: 02/11/24  Therapeutic Exercise: Performed elliptical prior to session x20min L knee PROM Incline stretch 30sec x3 Seated HSS x30sec L Seated piriformis stretch (fig 4 and pull across) Leg press 70# x10, 90# x10 HSC machine 45# 2x10 Knee extension machine 25# 2x10 Hip abduction machine 55# 3x10 L/R SLR x 15 Review of gym program HEP update Review of STS technique Supine piriformis stretch (fot HEP)        [facility] Adult PT Treatment:                                                DATE: 01/31/24  Therapeutic Exercise: Upright bike L2-5 x 6 min for warm up L heel slides x 5 (ROM up to 124) L/R SLR x 10 R/L piriformis stretch (fig 4 pulling knee to opp shoulder) x 30s (R tighter than L)  R/L hamstring stretch in hooklying x 15s x 2 reps each LE with strap Standing quad stretch with finger in shoe x 15s x 2 each LE  Standing L adductor stretch with hands on elevated table, hip hinge x 10-15s Seated hamstring stretch with cues to hold stretch longer (<5sec)  Therapeutic Activity: STS with forward arm reach, controlled descent to surface  x 5 -> in staggered stance x 5 each LE forward    PATIENT EDUCATION:  Education details: HEP, rationale of interventions, and exercise form.  Person educated: Patient Education method: Explanation, Demonstration, Tactile cues, Verbal cues Education comprehension: verbalized understanding, returned demonstration, verbal cues required, tactile cues required, and needs further education  HOME EXERCISE PROGRAM: Access Code: [email or web address] URL: [email or web address]  ASSESSMENT:  CLINICAL IMPRESSION: Pt  presents to Rx reporting no pain.  He reports he is increasing his exercises and his walking program.  Pt still reports swelling though he doesn't know any  specific act's that increase swelling.  PT took girth measurements prior to exercises.  His girth measurement at mid patella was slightly larger than prior measurements though better than initial eval.  Pt performed exercises well with cuing and instruction in correct form and set up.  PT educated pt with proper set up of gym equipment.  Pt responded well to Rx reporting no pain and having no c/o's after Rx.  Pt should continue to benefit from skilled PT to address impairments and improve overall function.      OBJECTIVE IMPAIRMENTS: decreased activity tolerance, decreased  mobility, difficulty walking, decreased ROM, decreased strength, hypomobility, increased edema, and pain.   ACTIVITY LIMITATIONS: stairs and locomotion level  PARTICIPATION LIMITATIONS: recreational/workout activities  PERSONAL FACTORS: 1 comorbidity: A-fib are also affecting patient's functional outcome.   REHAB POTENTIAL: Good  CLINICAL DECISION MAKING: Stable/uncomplicated  EVALUATION COMPLEXITY: Low   GOALS:  SHORT TERM GOALS: Target date: 02/15/2024  Pt will be independent and compliant with HEP for improved pain, strength, ROM, and function.  Baseline: Goal status: INITIAL  2.  Pt will report at least a 25% improvement in pain and sx's overall.  Baseline:  Goal status: MET - 02/17/24  3.  Pt's swelling will improve as evidenced by mid patella measurement being no > than 39 cm for improved mobility Baseline: see above  Goal status: In progress - 02/17/24 Target date:  02/22/2024    LONG TERM GOALS: Target date: 03/21/2024  Pt will demo improved L quad strength to 5/5 MMT for improved ambulation and functional mobility. Baseline:  Goal status: INITIAL  2.  Pt will demonstrate a reciprocal gait on stairs with good control with a rail.   Baseline:  Goal  status: INITIAL  3.  Pt will perform his walking program without adverse effects.  Baseline:  Goal status: INITIAL  4.  Pt will be able to play pickleball without adverse effects.  Baseline:  Goal status: INITIAL   PLAN:  PT FREQUENCY: 1x/week  PT DURATION: 6-8 weeks  PLANNED INTERVENTIONS: 97164- PT Re-evaluation, 97750- Physical Performance Testing, 97110-Therapeutic exercises, 97530- Therapeutic activity, [id number]- Neuromuscular re-education, 97535- Self Care, 86578- Manual therapy, [id number]- Gait training, [phone]- Aquatic Therapy, [phone]- Vasopneumatic device, [phone]- Ultrasound, Patient/Family education, Balance training, Stair training, Taping, Dry Needling, Joint mobilization, Cryotherapy, and Moist heat  PLAN FOR NEXT SESSION: Review and perform HEP.  Monitor swelling.  Cont with L hip and knee strengthening per pt tolerance.    Trina Fujita III PT, DPT 03/10/24 2:44 PM

## 2024-03-11 ENCOUNTER — Other Ambulatory Visit: Payer: Self-pay | Admitting: Internal Medicine

## 2024-03-13 DIAGNOSIS — H00011 Hordeolum externum right upper eyelid: Secondary | ICD-10-CM | POA: Diagnosis not present

## 2024-03-16 ENCOUNTER — Encounter (HOSPITAL_BASED_OUTPATIENT_CLINIC_OR_DEPARTMENT_OTHER)

## 2024-03-16 ENCOUNTER — Encounter (HOSPITAL_BASED_OUTPATIENT_CLINIC_OR_DEPARTMENT_OTHER): Payer: Self-pay

## 2024-03-21 DIAGNOSIS — M79671 Pain in right foot: Secondary | ICD-10-CM | POA: Diagnosis not present

## 2024-03-21 DIAGNOSIS — L0889 Other specified local infections of the skin and subcutaneous tissue: Secondary | ICD-10-CM | POA: Diagnosis not present

## 2024-03-21 DIAGNOSIS — M79672 Pain in left foot: Secondary | ICD-10-CM | POA: Diagnosis not present

## 2024-03-21 DIAGNOSIS — M25562 Pain in left knee: Secondary | ICD-10-CM | POA: Diagnosis not present

## 2024-03-29 ENCOUNTER — Other Ambulatory Visit: Payer: Self-pay | Admitting: Internal Medicine

## 2024-04-03 ENCOUNTER — Other Ambulatory Visit (HOSPITAL_COMMUNITY): Payer: Self-pay

## 2024-04-03 ENCOUNTER — Other Ambulatory Visit: Payer: Self-pay

## 2024-04-03 ENCOUNTER — Encounter (INDEPENDENT_AMBULATORY_CARE_PROVIDER_SITE_OTHER): Payer: Self-pay

## 2024-04-03 NOTE — Progress Notes (Signed)
 Specialty Pharmacy Refill Coordination Note  Jeffery Underwood is a 68 y.o. male contacted today regarding refills of specialty medication(s) Dupilumab  (Dupixent )   Patient requested (Patient-Rptd) Delivery   Delivery date: 04/05/24   Verified address: (Patient-Rptd) [address] [place] [address]   Medication will be filled on 04/04/24.

## 2024-04-05 ENCOUNTER — Other Ambulatory Visit: Payer: Self-pay

## 2024-04-05 NOTE — Telephone Encounter (Signed)
 Received fax from [facility] request for Valsartan  40 mg.  Per chart note from OV on 03/07/2024: I think your lisinopril  could be to blame for the cough. I am switching you to a medication called valsartan  40 mg once daily. I have sent this to your pharmacy and let Dr. Elmira know. I will let his office take over refills.   Will deny refill.  Patient needs to follow up with Dr. Elmira for further refills.  Sent pharmacy a note.

## 2024-04-06 ENCOUNTER — Other Ambulatory Visit: Payer: Self-pay | Admitting: Cardiology

## 2024-04-06 DIAGNOSIS — G4733 Obstructive sleep apnea (adult) (pediatric): Secondary | ICD-10-CM | POA: Diagnosis not present

## 2024-04-19 DIAGNOSIS — L821 Other seborrheic keratosis: Secondary | ICD-10-CM | POA: Diagnosis not present

## 2024-04-19 DIAGNOSIS — L570 Actinic keratosis: Secondary | ICD-10-CM | POA: Diagnosis not present

## 2024-04-19 DIAGNOSIS — D1801 Hemangioma of skin and subcutaneous tissue: Secondary | ICD-10-CM | POA: Diagnosis not present

## 2024-04-19 DIAGNOSIS — L0100 Impetigo, unspecified: Secondary | ICD-10-CM | POA: Diagnosis not present

## 2024-04-19 DIAGNOSIS — L309 Dermatitis, unspecified: Secondary | ICD-10-CM | POA: Diagnosis not present

## 2024-04-19 DIAGNOSIS — L578 Other skin changes due to chronic exposure to nonionizing radiation: Secondary | ICD-10-CM | POA: Diagnosis not present

## 2024-04-19 DIAGNOSIS — C44311 Basal cell carcinoma of skin of nose: Secondary | ICD-10-CM | POA: Diagnosis not present

## 2024-04-19 DIAGNOSIS — D485 Neoplasm of uncertain behavior of skin: Secondary | ICD-10-CM | POA: Diagnosis not present

## 2024-04-19 DIAGNOSIS — Z85828 Personal history of other malignant neoplasm of skin: Secondary | ICD-10-CM | POA: Diagnosis not present

## 2024-04-19 DIAGNOSIS — Z85820 Personal history of malignant melanoma of skin: Secondary | ICD-10-CM | POA: Diagnosis not present

## 2024-04-19 DIAGNOSIS — L814 Other melanin hyperpigmentation: Secondary | ICD-10-CM | POA: Diagnosis not present

## 2024-04-25 ENCOUNTER — Other Ambulatory Visit: Payer: Self-pay

## 2024-04-26 ENCOUNTER — Other Ambulatory Visit: Payer: Self-pay

## 2024-04-26 ENCOUNTER — Encounter (INDEPENDENT_AMBULATORY_CARE_PROVIDER_SITE_OTHER): Payer: Self-pay

## 2024-04-26 ENCOUNTER — Other Ambulatory Visit: Payer: Self-pay | Admitting: Pharmacy Technician

## 2024-04-26 DIAGNOSIS — C44311 Basal cell carcinoma of skin of nose: Secondary | ICD-10-CM | POA: Diagnosis not present

## 2024-04-26 NOTE — Progress Notes (Signed)
 Specialty Pharmacy Refill Coordination Note  Jeffery Underwood is a 68 y.o. male contacted today regarding refills of specialty medication(s)  Patient requested Dupilumab  (Dupixent )    Delivery date: 05/10/24  Verified address: (Patient-Rptd) [address], [place] [address]   Medication will be filled on 05/09/24.

## 2024-04-27 ENCOUNTER — Other Ambulatory Visit: Payer: Self-pay

## 2024-04-27 ENCOUNTER — Telehealth: Payer: Self-pay

## 2024-04-27 MED ORDER — FLUTICASONE-SALMETEROL 230-21 MCG/ACT IN AERO: 2.0000 | INHALATION_SPRAY | Freq: Two times a day (BID) | RESPIRATORY_TRACT | 3 refills | Status: DC

## 2024-04-27 MED ORDER — FLUTICASONE-SALMETEROL 230-21 MCG/ACT IN AERO
2.0000 | INHALATION_SPRAY | Freq: Two times a day (BID) | RESPIRATORY_TRACT | 3 refills | Status: AC
Start: 2024-04-27 — End: ?

## 2024-04-27 NOTE — Telephone Encounter (Signed)
 Copied from CRM [id number]. Topic: Clinical - Prescription Issue >> Apr 27, 2024  1:10 PM Rozanna G wrote: Reason for CRM: PT STATED MAIL ORDER PHARMACY STATED THEY WILL NEED A NEW PRESCRIPTION ADVAIR HFA 230-21 MCG/ACT inhaler STATING NAME BRAND ONLY IN ORDER TO FILL THIS ONE AND NOT USE THE GENERIC WHICH COSTING PT 10X'S MORE    Spoke w/ PT Rx sent/ faxed to pharmacy     NFN

## 2024-05-18 DIAGNOSIS — Z23 Encounter for immunization: Secondary | ICD-10-CM | POA: Diagnosis not present

## 2024-05-25 DIAGNOSIS — G4733 Obstructive sleep apnea (adult) (pediatric): Secondary | ICD-10-CM | POA: Diagnosis not present

## 2024-05-25 DIAGNOSIS — J4520 Mild intermittent asthma, uncomplicated: Secondary | ICD-10-CM | POA: Diagnosis not present

## 2024-05-25 DIAGNOSIS — J310 Chronic rhinitis: Secondary | ICD-10-CM | POA: Diagnosis not present

## 2024-05-30 ENCOUNTER — Encounter (INDEPENDENT_AMBULATORY_CARE_PROVIDER_SITE_OTHER): Payer: Self-pay

## 2024-05-31 ENCOUNTER — Other Ambulatory Visit: Payer: Self-pay

## 2024-05-31 ENCOUNTER — Other Ambulatory Visit: Payer: Self-pay | Admitting: Pharmacy Technician

## 2024-05-31 NOTE — Progress Notes (Signed)
 Specialty Pharmacy Refill Coordination Note  Jeffery Underwood is a 68 y.o. male contacted today regarding refills of specialty medication(s) Dupilumab  (Dupixent )   Patient requested Delivery   Delivery date: 06/06/24   Verified address: [address], [place] [address]   Medication will be filled on 06/05/24.  Injection date: 9/19  Called patient on 05/31/24 to confirm spelling of delivery address.

## 2024-06-05 ENCOUNTER — Other Ambulatory Visit: Payer: Self-pay

## 2024-06-18 ENCOUNTER — Other Ambulatory Visit: Payer: Self-pay | Admitting: Cardiology

## 2024-06-24 ENCOUNTER — Other Ambulatory Visit: Payer: Self-pay | Admitting: Internal Medicine

## 2024-06-26 ENCOUNTER — Other Ambulatory Visit (HOSPITAL_COMMUNITY): Payer: Self-pay

## 2024-06-29 ENCOUNTER — Other Ambulatory Visit: Payer: Self-pay | Admitting: Pharmacy Technician

## 2024-06-29 ENCOUNTER — Other Ambulatory Visit: Payer: Self-pay

## 2024-06-29 NOTE — Progress Notes (Signed)
 Specialty Pharmacy Refill Coordination Note  Jeffery Underwood is a 68 y.o. male contacted today regarding refills of specialty medication(s) Dupilumab  (Dupixent )   Patient requested Delivery   Delivery date: 07/06/24   Verified address: [address] [place]   Medication will be filled on 07/05/24.

## 2024-07-05 ENCOUNTER — Other Ambulatory Visit: Payer: Self-pay

## 2024-07-11 ENCOUNTER — Ambulatory Visit: Admitting: Nurse Practitioner

## 2024-07-18 ENCOUNTER — Ambulatory Visit (INDEPENDENT_AMBULATORY_CARE_PROVIDER_SITE_OTHER)

## 2024-07-18 ENCOUNTER — Encounter: Payer: Self-pay | Admitting: Nurse Practitioner

## 2024-07-18 ENCOUNTER — Ambulatory Visit: Payer: Self-pay | Admitting: Nurse Practitioner

## 2024-07-18 ENCOUNTER — Ambulatory Visit: Admitting: Nurse Practitioner

## 2024-07-18 VITALS — BP 122/70 | HR 61 | Temp 98.3°F | Ht 70.0 in | Wt 179.8 lb

## 2024-07-18 DIAGNOSIS — J019 Acute sinusitis, unspecified: Secondary | ICD-10-CM | POA: Diagnosis not present

## 2024-07-18 DIAGNOSIS — J0191 Acute recurrent sinusitis, unspecified: Secondary | ICD-10-CM

## 2024-07-18 DIAGNOSIS — J209 Acute bronchitis, unspecified: Secondary | ICD-10-CM | POA: Diagnosis not present

## 2024-07-18 DIAGNOSIS — J45901 Unspecified asthma with (acute) exacerbation: Secondary | ICD-10-CM | POA: Diagnosis not present

## 2024-07-18 LAB — NITRIC OXIDE: Nitric Oxide: 25

## 2024-07-18 MED ORDER — PREDNISONE 20 MG PO TABS: 40.0000 mg | ORAL_TABLET | Freq: Every day | ORAL | 0 refills | Status: AC

## 2024-07-18 MED ORDER — AMOXICILLIN-POT CLAVULANATE 875-125 MG PO TABS: 1.0000 | ORAL_TABLET | Freq: Two times a day (BID) | ORAL | 0 refills | Status: AC

## 2024-07-18 MED ORDER — BENZONATATE 200 MG PO CAPS: 200.0000 mg | ORAL_CAPSULE | Freq: Three times a day (TID) | ORAL | 1 refills | Status: AC | PRN

## 2024-07-18 MED ORDER — FLUTICASONE PROPIONATE 50 MCG/ACT NA SUSP: 2.0000 | Freq: Every day | NASAL | 2 refills | Status: AC

## 2024-07-18 MED ORDER — PROMETHAZINE-DM 6.25-15 MG/5ML PO SYRP: 5.0000 mL | ORAL_SOLUTION | Freq: Four times a day (QID) | ORAL | 0 refills | Status: AC | PRN

## 2024-07-18 NOTE — Progress Notes (Signed)
 Called and spoke to pt - advised of CXR results per Izetta Rouleau, NP. Pt verbalized understanding, NFN.

## 2024-07-18 NOTE — Progress Notes (Signed)
 @Patient  ID: Jeffery Underwood, male    DOB: 04/14/1956, 68 y.o.   MRN: [id number]  Chief Complaint  Patient presents with   Medical Management of Chronic Issues   Cough    Increased cough x 3 wks- prod with large amounts of cream colored to green sputum.  He was seen at [facility] and given doxy- this helped and then cough returned again approx 2 wks ago.     Referring provider: Verena Mems, MD  HPI: 68 year old male, never smoker followed for asthma and chronic allergic rhinitis. He is a patient of Dr. Correne and last seen in office 03/07/2024. Past medical history significant for PAF on Eliquis , OSA on CPAP.   TEST/EVENTS:  06/2022 eos 0 07/23/2022 FeNO 23 ppb 07/23/2022 CXR: significantly improved right basilar pna with mild residual scarring/atelectasis. Lungs otherwise clear  07/23/2022 spirometry: FVC 64, FEV1 69, ratio 88 10/13/2023 CXR: lungs clear  03/07/2024: OV with Dr. Meade. Started Dupixent  2024. Started on PPI for GERD after last OV which showed dysmotility and reflux. Does not feel trial of PPI has helped cough. Coughing about ten times a day with sputum. Associated with exertion and SOB. Drinks 1 cup of coffee a day; cut back on alcohol from 2-3 times/day to 2-3 times/week. No sinus drainage, congestion. Been on lisinopril  for 3 years. Slight runny nose last 3 weeks. Changed lisinopril  to valsartan  40 mg daily for the cough. If stopping ACEi doesn't help cough, will need to go to GI. Continue omeprazole .   07/18/2024: Today - acute Discussed the use of AI scribe software for clinical note transcription with the patient, who gave verbal consent to proceed.  History of Present Illness Jeffery Underwood is a 68 year old male with asthma who presents with a productive cough and sinus symptoms.  He has experienced a productive cough for approximately three weeks, producing cream-colored phlegm. Initially, he sought care at an urgent care facility in [place] and was prescribed a course of  antibiotics, likely azithromycin  based on dosing he recalls, which provided some improvement. However, the cough returned after flying back home, now accompanied by symptoms of a head cold and sinus drainage. He feels lousy and fatigued. He has chest tightness and feeling more short winded than usual. Occasional wheeze.   He continues to use Advair twice daily for asthma management and uses his rescue inhalers, albuterol  and Air Supra, about three to four times a day. He does not perceive a significant difference between the Airsupra  and albuterol  inhalers; understands to alternate between the two and not use at the same time. Additionally, he uses Flonase  nasal spray twice daily to manage sinus symptoms but has recently run out of this.  No fever or hemoptysis. Does have some sinus tenderness at times. He has been using honey as a natural remedy for his cough, which helps.  No chest pain, orthopnea, leg swelling. Eating and drinking well.   Lab Results  Component Value Date   NITRICOXIDE 25 07/18/2024   This result suggests intermediate (25-49) Type 2 (T2) airway inflammation; clinical correlation required.     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   Right Anterior Neck   OSA (obstructive sleep apnea)    Prediabetes 09/21/2004   resolved 10/2011   Psoriasis    Recurrent upper respiratory infection (URI)    SCCA (squamous cell  carcinoma) of skin 03/19/2021   Right Sideburn (well diff)   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 17 each 5   Albuterol -Budesonide (AIRSUPRA ) 90-80 MCG/ACT AERO Inhale 2 puffs into the lungs every 6 (six) hours as needed. 10.7 g 5   azelastine  (ASTELIN ) 0.1 % nasal spray Place 1 spray into both nostrils 2 (two) times daily. Use in each nostril as directed 30 mL 11   b complex vitamins capsule Take 1 capsule by mouth daily.     benzonatate  (TESSALON ) 200 MG capsule Take 200 mg by mouth 3 (three) times daily as needed.     buPROPion (WELLBUTRIN XL) 300 MG 24 hr tablet Take 300 mg by mouth in the morning.     cetirizine  (ZYRTEC ) 10 MG tablet Take 1 tablet (10 mg total) by mouth daily. 30 tablet 11   diltiazem  (CARDIZEM  CD) 180 MG 24 hr capsule TAKE 1 CAPSULE EVERY DAY 90 capsule 3   Dupilumab  (DUPIXENT ) 300 MG/2ML SOAJ Inject 300 mg into the skin every 14 (fourteen) days. **loading dose completed in clinic on 08/16/23** 12 mL 1   ELIQUIS  5 MG TABS tablet TAKE 1 TABLET TWICE DAILY 180 tablet 3   fluticasone  (FLONASE ) 50 MCG/ACT nasal spray USE 2 SPRAYS IN BOTH NOSTRILS DAILY 48 g 3   fluticasone -salmeterol (ADVAIR HFA) 230-21 MCG/ACT inhaler Inhale 2 puffs into the lungs 2 (two) times daily. 36 g 3   IRON-VITAMIN C PO Take 1 tablet by mouth daily.     metoprolol  tartrate (LOPRESSOR ) 25 MG tablet TAKE 1/2 TABLET TWICE DAILY 90 tablet 3   montelukast  (SINGULAIR ) 10 MG tablet TAKE 1 TABLET AT BEDTIME 90 tablet 3    Multiple Vitamins-Minerals (MULTIVITAMIN PO) Take 1 tablet by mouth at bedtime.     omeprazole  (PRILOSEC) 40 MG capsule Take 1 capsule (40 mg total) by mouth daily. 30 capsule 3   Respiratory Therapy Supplies (CARETOUCH 2 CPAP HOSE HANGER) MISC See admin instructions.     rosuvastatin  (CRESTOR ) 10 MG tablet TAKE 1 TABLET EVERY DAY 90 tablet 3   valsartan  (DIOVAN ) 40 MG tablet Take 1 tablet (40 mg total) by mouth daily. 90 tablet 3   vitamin E 1000 UNIT capsule Take 1,000 Units by mouth at bedtime.     azelastine  (OPTIVAR ) 0.05 % ophthalmic solution Apply 1 drop to eye 2 (two) times daily as needed (itchy watery eyes). (Patient not taking: Reported on 07/18/2024) 6 mL 5   FLUoxetine (PROZAC) 20 MG capsule Take 20 mg by mouth daily.     No facility-administered medications prior to visit.     Review of Systems: as above  Physical Exam:  BP 122/70   Pulse 61   Temp 98.3 F (36.8 C) (Oral)   Ht 5' 10 (1.778 m) Comment: per pt  Wt 179 lb 12.8 oz (81.6 kg)   SpO2 94% Comment: on RA  BMI 25.80 kg/m   GEN: Pleasant, interactive, well-kempt; in no acute distress. HEENT:  Normocephalic and atraumatic. PERRLA. Sclera white. Nasal turbinates erythematous, moist and patent bilaterally. Cream rhinorrhea present. Oropharynx erythematous and moist, without exudate or edema. No lesions, ulcerations NECK:  Supple w/ fair ROM. Cervical lymphadenopathy.   CV: RRR, no m/r/g, no peripheral edema. Pulses intact, +2 bilaterally. No cyanosis, pallor or clubbing. PULMONARY:  Unlabored, regular breathing. Scattered wheezes bilaterally A&P. No accessory muscle use.  GI: BS present and normoactive. Soft, non-tender to palpation.  MSK: No erythema, warmth or tenderness.  Neuro: A/Ox3. No focal deficits noted.   Skin: Warm, no lesions or rashe Psych: Normal affect and behavior. Judgement and thought content appropriate.     Lab Results:  CBC    Component Value Date/Time   WBC 13.3 (H) 06/21/2022  1545   RBC 4.21 (L) 06/21/2022 1545   HGB 13.4 06/21/2022 1545   HGB 14.4 07/28/2021 1118   HCT 38.7 (L) 06/21/2022 1545   HCT 40.0 07/28/2021 1118   PLT 206 06/21/2022 1545   PLT 253 07/28/2021 1118   MCV 91.9 06/21/2022 1545   MCV 88 07/28/2021 1118   MCH 31.8 06/21/2022 1545   MCHC 34.6 06/21/2022 1545   RDW 12.9 06/21/2022 1545   RDW 12.2 07/28/2021 1118   LYMPHSABS 0.7 06/21/2022 1545   MONOABS 1.1 (H) 06/21/2022 1545   EOSABS 0.0 06/21/2022 1545   BASOSABS 0.0 06/21/2022 1545    BMET    Component Value Date/Time   NA 140 06/21/2022 1545   NA 137 07/28/2021 1118   K 4.2 06/21/2022 1545   CL 107 06/21/2022 1545   CO2 26 06/21/2022 1545   GLUCOSE 128 (H) 06/21/2022 1545   BUN 17 06/21/2022 1545   BUN 15 07/28/2021 1118   CREATININE 1.18 06/21/2022 1545   CALCIUM  9.2 06/21/2022 1545   GFRNONAA >60 06/21/2022 1545   GFRAA >60 11/29/2017 2236    BNP No results found for: BNP   Imaging:  No results found.  Administration History     None           No data to display          No results found for: NITRICOXIDE      Assessment & Plan:   No problem-specific Assessment & Plan notes found for this encounter. Assessment and Plan Assessment & Plan Asthma with acute exacerbation, complicated by acute bronchitis and acute sinusitis Asthma exacerbation with increased use of rescue inhalers (Air Supra and albuterol ) 3-4 times daily. Productive cough with cream-colored phlegm, worsened after flying, now associated with sinus drainage and upper airway irritation. Sinus tenderness and inflamed lymph nodes suggest sinusitis. No fever or hemoptysis. Bronchospasm on examination. Will obtain CXR to rule out superimposed infection. Will treat with empiric augmentin  for broader coverage of respiratory and sinus pathogens. Steroids indicated for asthma exacerbation with elevated exhaled nitric oxide  testing. Promethazine  DM and Tessalon  for cough management.  Mucinex to help expectorate. Flonase  and saline rinses for sinusitis. Action plan in place. Medication side effect profiles reviewed.  - Prescribed Augmentin  875 mg twice daily for 7 days. - Prescribed prednisone 40 mg daily for 5 days. - Prescribed promethazine  DM for cough, cautioned about drowsiness. -  Refilled Tessalon  for use three times daily PRN - Recommended Mucinex OTC - Continue Flonase  nasal spray twice daily. - Recommended saline nasal rinses 1-2 times daily. - Ordered chest x-ray to rule out pneumonia. - Advised use of throat lozenges and frequent sips of water or tea with honey. - Instructed to call if symptoms do not improve or worsen - ED precautions reviewed     I spent 32 minutes of dedicated to the care of this patient on the date of this encounter to include pre-visit review of records, face-to-face time with the patient discussing conditions above, post visit ordering of testing, clinical documentation with the electronic health record, making appropriate referrals as documented, and communicating necessary findings to members of the patients care team.  Comer LULLA Rouleau, NP 07/18/2024  Pt aware and understands NP's role.

## 2024-07-18 NOTE — Patient Instructions (Addendum)
 Continue Albuterol  inhaler 2 puffs every 6 hours as needed for shortness of breath or wheezing. Notify if symptoms persist despite rescue inhaler/neb use.  Continue flonase  nasal spray 2 sprays each nostril daily  Continue Advair 2 puffs Twice daily. Brush tongue and rinse mouth afterwards  Continue Dupixent  injections every 2 weeks as scheduled  Continue cetirizine  1 tab daily Continue montelukast  1 tab daily Continue omeprazole  1 capsule daily  Augmentin  1 tab Twice daily for 7 days. Take with food Prednisone 40 mg daily for 5 days. Take in AM with food Guaifenesin [phone] mg Twice daily for cough/congestion over the counter Saline rinses, use bottled distilled water, 1-2 times a day then follow with flonase  30 min later  Benzonatate  (tessalon ) 1 capsule Three times a day as needed for cough Promethazine  DM cough syrup 5 mL every 6 hours as needed for cough. May cause drowsiness. Do not drive after taking  Chest x ray today   Upper airway cough syndrome: Suppress your cough to allow your larynx (voice box) to heal.  Limit talking for the next few days. Avoid throat clearing. Work on cough suppression with the above recommended suppressants.  Use sugar free hard candies or non-menthol cough drops during this time to soothe your throat.  Warm tea with honey and lemon.   Follow up in 2-3 weeks with Dr. Meade or Jeffery Nyjai Graff,NP. If symptoms do not improve or worsen, please contact office for sooner follow up or seek emergency care.

## 2024-07-18 NOTE — Progress Notes (Signed)
 CXR clear; no evidence of pneumonia

## 2024-07-26 ENCOUNTER — Other Ambulatory Visit: Payer: Self-pay | Admitting: Internal Medicine

## 2024-07-26 ENCOUNTER — Other Ambulatory Visit (HOSPITAL_COMMUNITY): Payer: Self-pay

## 2024-07-26 ENCOUNTER — Encounter (HOSPITAL_COMMUNITY): Payer: Self-pay

## 2024-07-26 ENCOUNTER — Other Ambulatory Visit: Payer: Self-pay

## 2024-07-26 ENCOUNTER — Other Ambulatory Visit (HOSPITAL_BASED_OUTPATIENT_CLINIC_OR_DEPARTMENT_OTHER): Payer: Self-pay

## 2024-07-26 DIAGNOSIS — J4550 Severe persistent asthma, uncomplicated: Secondary | ICD-10-CM

## 2024-07-26 MED ORDER — DUPIXENT 300 MG/2ML ~~LOC~~ SOAJ
300.0000 mg | SUBCUTANEOUS | 1 refills | Status: DC
  Filled 2024-07-26: qty 12, 84d supply, fill #0
  Filled 2024-07-26: qty 4, 28d supply, fill #0
  Filled 2024-08-21: qty 4, 28d supply, fill #1
  Filled 2024-09-19 – 2024-09-20 (×2): qty 4, 28d supply, fill #2
  Filled 2024-10-17: qty 4, 28d supply, fill #3

## 2024-07-27 ENCOUNTER — Other Ambulatory Visit: Payer: Self-pay

## 2024-07-27 NOTE — Progress Notes (Signed)
 Specialty Pharmacy Refill Coordination Note  Jeffery Underwood is a 68 y.o. male contacted today regarding refills of specialty medication(s) Dupilumab  (Dupixent )   Patient requested Delivery   Delivery date: 08/01/24   Verified address: [address] [place]   Medication will be filled on: 07/31/24

## 2024-07-27 NOTE — Telephone Encounter (Signed)
 Dupixent  refill fulfilled by Dr. Meade. NFN

## 2024-07-28 ENCOUNTER — Other Ambulatory Visit: Payer: Self-pay | Admitting: Internal Medicine

## 2024-07-31 ENCOUNTER — Other Ambulatory Visit: Payer: Self-pay

## 2024-08-01 ENCOUNTER — Other Ambulatory Visit: Payer: Self-pay

## 2024-08-18 ENCOUNTER — Emergency Department (HOSPITAL_BASED_OUTPATIENT_CLINIC_OR_DEPARTMENT_OTHER)
Admission: EM | Admit: 2024-08-18 | Discharge: 2024-08-18 | Disposition: A | Discharge status: Home / Self Care | Attending: Emergency Medicine | Admitting: Emergency Medicine

## 2024-08-18 ENCOUNTER — Encounter (HOSPITAL_BASED_OUTPATIENT_CLINIC_OR_DEPARTMENT_OTHER): Payer: Self-pay | Admitting: Emergency Medicine

## 2024-08-18 ENCOUNTER — Emergency Department (HOSPITAL_BASED_OUTPATIENT_CLINIC_OR_DEPARTMENT_OTHER): Admitting: Radiology

## 2024-08-18 ENCOUNTER — Other Ambulatory Visit: Payer: Self-pay

## 2024-08-18 DIAGNOSIS — I480 Paroxysmal atrial fibrillation: Secondary | ICD-10-CM | POA: Insufficient documentation

## 2024-08-18 DIAGNOSIS — R0789 Other chest pain: Secondary | ICD-10-CM | POA: Insufficient documentation

## 2024-08-18 DIAGNOSIS — Z85828 Personal history of other malignant neoplasm of skin: Secondary | ICD-10-CM | POA: Diagnosis not present

## 2024-08-18 DIAGNOSIS — R079 Chest pain, unspecified: Secondary | ICD-10-CM | POA: Diagnosis present

## 2024-08-18 DIAGNOSIS — J45909 Unspecified asthma, uncomplicated: Secondary | ICD-10-CM | POA: Diagnosis not present

## 2024-08-18 DIAGNOSIS — Z7901 Long term (current) use of anticoagulants: Secondary | ICD-10-CM | POA: Diagnosis not present

## 2024-08-18 DIAGNOSIS — Z7951 Long term (current) use of inhaled steroids: Secondary | ICD-10-CM | POA: Diagnosis not present

## 2024-08-18 LAB — COMPREHENSIVE METABOLIC PANEL WITH GFR
ALT: 35 U/L (ref 0–44)
AST: 29 U/L (ref 15–41)
Albumin: 3.9 g/dL (ref 3.5–5.0)
Alkaline Phosphatase: 73 U/L (ref 38–126)
Anion gap: 11 (ref 5–15)
BUN: 12 mg/dL (ref 8–23)
CO2: 23 mmol/L (ref 22–32)
Calcium: 9.7 mg/dL (ref 8.9–10.3)
Chloride: 107 mmol/L (ref 98–111)
Creatinine, Ser: 0.95 mg/dL (ref 0.61–1.24)
GFR, Estimated: >60 mL/min (ref >60)
Glucose, Bld: 141 mg/dL — ABNORMAL HIGH (ref 70–99)
Potassium: 4.2 mmol/L (ref 3.5–5.1)
Sodium: 141 mmol/L (ref 135–145)
Total Bilirubin: 0.5 mg/dL (ref 0.0–1.2)
Total Protein: 6.6 g/dL (ref 6.5–8.1)

## 2024-08-18 LAB — TROPONIN T, HIGH SENSITIVITY
Troponin T High Sensitivity: 19 ng/L (ref 0–19)
Troponin T High Sensitivity: 19 ng/L (ref 0–19)

## 2024-08-18 LAB — CBC WITH DIFFERENTIAL/PLATELET
Abs Immature Granulocytes: 0.02 K/uL (ref 0.00–0.07)
Basophils Absolute: 0 K/uL (ref 0.0–0.1)
Basophils Relative: 0 %
Eosinophils Absolute: 0.4 K/uL (ref 0.0–0.5)
Eosinophils Relative: 5 %
HCT: 37.1 % — ABNORMAL LOW (ref 39.0–52.0)
Hemoglobin: 12.8 g/dL — ABNORMAL LOW (ref 13.0–17.0)
Immature Granulocytes: 0 %
Lymphocytes Relative: 11 %
Lymphs Abs: 1 K/uL (ref 0.7–4.0)
MCH: 30 pg (ref 26.0–34.0)
MCHC: 34.5 g/dL (ref 30.0–36.0)
MCV: 86.9 fL (ref 80.0–100.0)
Monocytes Absolute: 0.9 K/uL (ref 0.1–1.0)
Monocytes Relative: 10 %
Neutro Abs: 6.5 K/uL (ref 1.7–7.7)
Neutrophils Relative %: 74 %
Platelets: 260 K/uL (ref 150–400)
RBC: 4.27 MIL/uL (ref 4.22–5.81)
RDW: 13.9 % (ref 11.5–15.5)
WBC: 8.8 K/uL (ref 4.0–10.5)
nRBC: 0 % (ref 0.0–0.2)

## 2024-08-18 MED ORDER — LIDOCAINE 5 % EX PTCH
1.0000 | MEDICATED_PATCH | CUTANEOUS | Status: DC
  Administered 2024-08-18: 1 via TRANSDERMAL
  Filled 2024-08-18: qty 1

## 2024-08-18 MED ORDER — ACETAMINOPHEN 500 MG PO TABS
1000.0000 mg | ORAL_TABLET | Freq: Once | ORAL | Status: DC
  Filled 2024-08-18: qty 2

## 2024-08-18 MED ORDER — LIDOCAINE 5 % EX PTCH: 1.0000 | MEDICATED_PATCH | CUTANEOUS | 0 refills | Status: AC

## 2024-08-18 NOTE — ED Triage Notes (Signed)
 Left sided CP after picking up something heavy Pain worse when moving and laying down. Chronic cough

## 2024-08-18 NOTE — ED Provider Notes (Signed)
 [place] EMERGENCY DEPARTMENT AT [facility] Provider Note   CSN: [id number] Arrival date & time: 08/18/24  9096     Patient presents with: Chest Pain   Jeffery Underwood is a 68 y.o. male with history of squamous cell carcinoma of the skin, hypertriglyceridemia, paroxysmal A-fib on Eliquis , OSA, asthmatic bronchitis, presents with concern for left-sided chest pain that began last night after lifting up a turkey.  He reports that the pain seems to have gradually worsened overnight and into this morning.  No chest pain with exertion.  No associated shortness of breath.  Pain does not radiate to the back.  He reports a chronic productive cough, no new cough.  No fever or chills.  Reports some baseline shortness of breath from his asthmatic bronchitis, but no new shortness of breath.  Denies any pain or swelling in his legs.  No recent long plane or car rides, no recent hospitalizations or surgeries.  No history of blood clot.   HPI     Prior to Admission medications   Medication Sig Start Date End Date Taking? Authorizing Provider  lidocaine  (LIDODERM ) 5 % Place 1 patch onto the skin daily. Remove & Discard patch within 12 hours or as directed by MD 08/18/24  Yes Veta Palma, PA-C  albuterol  (VENTOLIN  HFA) 108 (90 Base) MCG/ACT inhaler Inhale 2 puffs into the lungs as needed for wheezing or shortness of breath. 08/04/23   Desai, Nikita S, MD  Albuterol -Budesonide (AIRSUPRA ) 90-80 MCG/ACT AERO INHALE 2 PUFFS INTO THE LUNGS EVERY 6 (SIX) HOURS AS NEEDED. 08/16/24   Meade Verdon RAMAN, MD  azelastine  (ASTELIN ) 0.1 % nasal spray Place 1 spray into both nostrils 2 (two) times daily. Use in each nostril as directed 10/13/22   Tobie Arleta SQUIBB, MD  azelastine  (OPTIVAR ) 0.05 % ophthalmic solution Apply 1 drop to eye 2 (two) times daily as needed (itchy watery eyes). Patient not taking: Reported on 07/18/2024 10/13/22   Tobie Arleta SQUIBB, MD  b complex vitamins capsule Take 1 capsule by mouth daily.     [provider]  benzonatate  (TESSALON ) 200 MG capsule Take 1 capsule (200 mg total) by mouth 3 (three) times daily as needed for cough. 07/18/24   Cobb, Comer GAILS, NP  buPROPion (WELLBUTRIN XL) 300 MG 24 hr tablet Take 300 mg by mouth in the morning. 07/29/21   [provider]  cetirizine  (ZYRTEC ) 10 MG tablet Take 1 tablet (10 mg total) by mouth daily. 10/13/22   Tobie Arleta SQUIBB, MD  diltiazem  (CARDIZEM  CD) 180 MG 24 hr capsule TAKE 1 CAPSULE EVERY DAY 12/10/23   Ladona Heinz, MD  Dupilumab  (DUPIXENT ) 300 MG/2ML SOAJ Inject 300 mg into the skin every 14 (fourteen) days. **loading dose completed in clinic on 08/16/23** 07/26/24   Meade Verdon RAMAN, MD  ELIQUIS  5 MG TABS tablet TAKE 1 TABLET TWICE DAILY 02/21/24   Patwardhan, Newman PARAS, MD  fluticasone  (FLONASE ) 50 MCG/ACT nasal spray Place 2 sprays into both nostrils daily. 07/18/24   Cobb, Comer GAILS, NP  fluticasone -salmeterol (ADVAIR HFA) 230-21 MCG/ACT inhaler Inhale 2 puffs into the lungs 2 (two) times daily. 04/27/24   Desai, Nikita S, MD  IRON-VITAMIN C PO Take 1 tablet by mouth daily.    [provider]  metoprolol  tartrate (LOPRESSOR ) 25 MG tablet TAKE 1/2 TABLET TWICE DAILY 04/06/24   Patwardhan, Newman PARAS, MD  montelukast  (SINGULAIR ) 10 MG tablet TAKE 1 TABLET AT BEDTIME 03/13/24   Desai, Nikita S, MD  Multiple Vitamins-Minerals (MULTIVITAMIN  PO) Take 1 tablet by mouth at bedtime.    [provider]  omeprazole  (PRILOSEC) 40 MG capsule Take 1 capsule (40 mg total) by mouth daily. 11/04/23   Desai, Nikita S, MD  promethazine -dextromethorphan (PROMETHAZINE -DM) 6.25-15 MG/5ML syrup Take 5 mLs by mouth 4 (four) times daily as needed for cough. 07/18/24   Cobb, Comer GAILS, NP  Respiratory Therapy Supplies (CARETOUCH 2 CPAP HOSE HANGER) MISC See admin instructions.    [provider]  rosuvastatin  (CRESTOR ) 10 MG tablet TAKE 1 TABLET EVERY DAY 06/19/24   Patwardhan, Manish J, MD  valsartan  (DIOVAN ) 40 MG tablet  Take 1 tablet (40 mg total) by mouth daily. 03/07/24   Patwardhan, Newman PARAS, MD  vitamin E 1000 UNIT capsule Take 1,000 Units by mouth at bedtime.    [provider]    Allergies: Patient has no known allergies.    Review of Systems  Constitutional:  Negative for fever.  Cardiovascular:  Positive for chest pain.    Updated Vital Signs BP 123/74   Pulse (!) 58   Temp 98 F (36.7 C) (Oral)   Resp 16   SpO2 96%   Physical Exam Vitals and nursing note reviewed.  Constitutional:      General: He is not in acute distress.    Appearance: He is well-developed.  HENT:     Head: Normocephalic and atraumatic.  Eyes:     Conjunctiva/sclera: Conjunctivae normal.  Cardiovascular:     Rate and Rhythm: Normal rate. Rhythm irregular.     Heart sounds: No murmur heard.    Comments: 2+ radial and pedal pulses bilaterally Pulmonary:     Effort: Pulmonary effort is normal. No respiratory distress.     Comments: Scattered wheezes bilaterally Talking in full sentences on room air without difficulty Abdominal:     Palpations: Abdomen is soft.     Tenderness: There is no abdominal tenderness.  Musculoskeletal:        General: No swelling.     Cervical back: Neck supple.     Comments: No calf tenderness to palpation bilaterally.  No calf edema bilaterally   Unable to reproduce patient's chest pain with palpation of the anterior chest wall  Skin:    General: Skin is warm and dry.     Capillary Refill: Capillary refill takes less than 2 seconds.  Neurological:     Mental Status: He is alert.  Psychiatric:        Mood and Affect: Mood normal.     (all labs ordered are listed, but only abnormal results are displayed) Labs Reviewed  CBC WITH DIFFERENTIAL/PLATELET - Abnormal; Notable for the following components:      Result Value   Hemoglobin 12.8 (*)    HCT 37.1 (*)    All other components within normal limits  COMPREHENSIVE METABOLIC PANEL WITH GFR - Abnormal; Notable for  the following components:   Glucose, Bld 141 (*)    All other components within normal limits  TROPONIN T, HIGH SENSITIVITY  TROPONIN T, HIGH SENSITIVITY    EKG: EKG Interpretation Date/Time:  Friday August 18 2024 09:10:38 EST Ventricular Rate:  64 PR Interval:  295 QRS Duration:  99 QT Interval:  389 QTC Calculation: 402 R Axis:   2  Text Interpretation: Sinus rhythm Prolonged PR interval Abnormal R-wave progression, early transition No significant change since last tracing Confirmed by Charlyn Sora ([id number]) on 08/18/2024 10:21:42 AM  Radiology: [facility] Chest 2 View Result Date: 08/18/2024 CLINICAL DATA:  Chest pain. EXAM: CHEST - 2 VIEW COMPARISON:  07/18/2024 FINDINGS: Lungs are clear without airspace disease or pulmonary edema. Questionable 5 mm nodule in the right lower lung that may have been present on the previous examination and likely an incidental finding. Heart size is normal. Trachea is midline. No large pleural effusions. IMPRESSION: 1. No acute cardiopulmonary disease. 2. Questionable 5 mm nodule in the right lower lung. This is likely an incidental finding. No follow-up needed if patient is low-risk. Non-contrast chest CT can be considered in 12 months if patient is high-risk. Electronically Signed   By: Juliene Balder M.D.   On: 08/18/2024 10:00     Procedures   Medications Ordered in the ED  lidocaine  (LIDODERM ) 5 % 1 patch (1 patch Transdermal Patch Applied 08/18/24 1204)  acetaminophen (TYLENOL) tablet 1,000 mg (1,000 mg Oral Not Given 08/18/24 1208)                                    Medical Decision Making Amount and/or Complexity of Data Reviewed Labs: ordered. Radiology: ordered.  Risk OTC drugs. Prescription drug management.     Differential diagnosis includes but is not limited to ACS, arrhythmia, aortic aneurysm, pericarditis, myocarditis, pericardial effusion, cardiac tamponade, musculoskeletal pain, GERD, Boerhaave's syndrome, DVT/PE,  pneumonia, pleural effusion   ED Course:  Upon initial evaluation, patient is very well-appearing, no acute distress.  Normal vital signs aside from slight bradycardia with rate in the 50s.  He has a history of paroxysmal A-fib, but is in normal sinus rhythm currently.   Labs Ordered: I Ordered, and personally interpreted labs.  The pertinent results include:   Initial and repeat troponin within normal limits CBC with mild anemia, hemoglobin 12.8.  No leukocytosis.  Otherwise unremarkable CMP within normal limits aside from elevated glucose at 141  Imaging Studies ordered: I ordered imaging studies including chest x-ray I independently visualized the imaging with scope of interpretation limited to determining acute life threatening conditions related to emergency care. Imaging showed  IMPRESSION:  1. No acute cardiopulmonary disease.  2. Questionable 5 mm nodule in the right lower lung. This is likely  an incidental finding. No follow-up needed if patient is low-risk.  Non-contrast chest CT can be considered in 12 months if patient is  high-risk.   I agree with the radiologist interpretation   Cardiac Monitoring: / EKG: The patient was maintained on a cardiac monitor.  I personally viewed and interpreted the cardiac monitored which showed an underlying rhythm of: Normal sinus rhythm, first degree AV block   Medications Given: Lidocaine  patch  Upon re-evaluation, patient remains well-appearing with stable vitals.   Low concern for ACS at this time given troponin remains stable with initial troponin of 19 and repeat of 19, pain non-exertional, and EKG with normal sinus rhythm and no ST changes. Chest x-ray without any consolidations that would be concerning for pneumonia, no widened mediastinum, no pleural effusions.  Patient did have a 5 mm pulmonary nodule noted on the right lung lobe.  I discussed this finding with patient and he understands he will need to notify his PCP regarding  this finding as he may need follow-up CT imaging of the chest for monitoring.  Very low concern for a PE given patient is on chronic anticoagulation and has been compliant.  His pain is also nonexertional.  Do not feel D-dimer or CTA chest is indicated at this time.  Suspect patient's pain is likely due to to muscle strain given this started after lifting a heavy turkey yesterday.  He does report concern for developing a pneumonia given he feels increased pain when taking deep breaths in and out.  I did provide him with a incentive spirometer to use at home and a respiratory therapist reviewed with patient how to use this at home.  He was given lidocaine  patch here for pain.  Feel he is stable and appropriate for discharge home this time    Impression: Chest pain, likely muscle strain  Disposition:  The patient was discharged home with instructions to continue using incentive spirometer as shown here.  May use Tylenol and lidocaine  patches as needed for pain.  Follow-up with his cardiologist if pain is not improving within the next week.  Follow-up with PCP within the next month regarding the pulmonary nodule finding on his chest x-ray. Return precautions given and patient verbalized understanding.    Record Review: External records from outside source obtained and reviewed including cardiology notes from Dr. Newman     This chart was dictated using voice recognition software, Dragon. Despite the best efforts of this provider to proofread and correct errors, errors may still occur which can change documentation meaning.       Final diagnoses:  Atypical chest pain    ED Discharge Orders          Ordered    lidocaine  (LIDODERM ) 5 %  Every 24 hours        08/18/24 1159               Veta Palma, PA-C 08/18/24 1215    Charlyn Sora, MD 08/18/24 [phone]

## 2024-08-18 NOTE — Discharge Instructions (Addendum)
 Your workup today is reassuring. It is very unlikely that your pain is due to an issue in your heart.  Your cardiac enzyme (troponin) was normal today. Your EKG which is a measure of the heart's electrical activity and rhythm is normal today. These would both show abnormalities if you were having a heart attack.  Your chest x-ray does not show any abnormalities to explain your pain.  There was a 5 mm nodule noted in the right lung.  Please make your primary care provider aware of this finding within the next month so that they can determine if you need any further follow-up imaging.  Please use the incentive spirometer as shown here to ensure you are taking deep breaths to help prevent pneumonia.  Please use this every hour while awake.  You have been prescribed lidocaine  patches to use on your chest to help with pain.  You may apply 1 patch for up to 12 hours at a time, then remove the patch for full 12 hours before reapplying a new patch.  You may take up to 1000mg  of tylenol every 6 hours as needed for pain.  Do not take more then 4g per day.  Please follow-up with your cardiologist if your pain is not starting to improve within the next week.  Return to the ER if you have any shortness of breath, difficulty breathing, worsening chest pain, dizziness, unexplained fever, any other new or concerning symptoms.

## 2024-08-21 ENCOUNTER — Other Ambulatory Visit: Payer: Self-pay

## 2024-08-23 ENCOUNTER — Ambulatory Visit: Admitting: Primary Care

## 2024-08-23 ENCOUNTER — Other Ambulatory Visit (HOSPITAL_COMMUNITY): Payer: Self-pay

## 2024-08-23 ENCOUNTER — Other Ambulatory Visit: Payer: Self-pay | Admitting: Internal Medicine

## 2024-08-23 ENCOUNTER — Encounter: Payer: Self-pay | Admitting: Primary Care

## 2024-08-23 VITALS — BP 124/60 | HR 61 | Temp 97.8°F | Ht 70.0 in | Wt 182.8 lb

## 2024-08-23 DIAGNOSIS — R053 Chronic cough: Secondary | ICD-10-CM

## 2024-08-23 DIAGNOSIS — J4540 Moderate persistent asthma, uncomplicated: Secondary | ICD-10-CM

## 2024-08-23 NOTE — Patient Instructions (Addendum)
  VISIT SUMMARY: During your visit today, we discussed your ongoing chronic productive cough, which has persisted despite previous treatments. We reviewed your current medications and considered potential changes to better manage your symptoms. We also discussed your history of asthma, gastroesophageal reflux disease, and the incidental finding of a stable lung nodule. A referral to a gastroenterologist for further evaluation of your esophagus is pending.  YOUR PLAN: -CHRONIC PRODUCTIVE COUGH: Your chronic productive cough continues despite previous treatments. The cough produces cream or beige sputum and occurs about once an hour. We suspect it may be related to asthmatic bronchitis and gastroesophageal reflux disease. We have refilled your Dupixent  and will continue your current medications, including Advair, Flonase , and omeprazole . We also consulted with Dr. Meade about possibly switching to a different biologic or trying gabapentin for your cough.  -COUGH VARIANT ASTHMA: Cough variant asthma is a type of asthma where the main symptom is a chronic cough. Your symptoms improve with prednisone , suggesting an asthmatic component. We will continue your current asthma management with Advair and Flonase  and have consulted with Dr. Meade about possibly switching to a different biologic.  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 You also have mild esophageal dysmotility and a small hiatal hernia, which may contribute to your chronic cough. We will continue your omeprazole  and await further evaluation from a gastroenterologist.  -STABLE RIGHT LOWER LUNG PULMONARY NODULE, LIKELY INCIDENTAL: A small nodule in your right lower lung was found on a chest x-ray. It is likely incidental and has not changed from previous exams. There are no signs  of pneumonia or other serious conditions, and you have no history of smoking.  INSTRUCTIONS: Please continue taking your current medications as prescribed. We have refilled your Dupixent . Await the gastroenterologist's evaluation for your esophageal issues. We will follow up with Dr. Meade regarding the potential switch to a different biologic or trying gabapentin for your chronic cough. If you experience any new or worsening symptoms, please contact our office.  Follow-up New patient visit with Dr. Adrien in December or Dr. Geronimo Dec 6th at 2:45- 30 mins (chronic cough/asthma)

## 2024-08-23 NOTE — Progress Notes (Unsigned)
 @Patient  ID: Jeffery Underwood, male    DOB: 1956/06/26, 68 y.o.   MRN: [id number]  Chief Complaint  Patient presents with   Asthma    Referring provider: Verena Mems, MD  HPI: 68 year old, never smoked. PMH significant for asthmatic bronchitis, mixed hyperlipidemia, OSA, aflutter, CAD, HTN.   Previous [facility] pulmonary encounter: 03/07/2024: OV with Dr. Meade. Started Dupixent  2024. Started on PPI for GERD after last OV which showed dysmotility and reflux. Does not feel trial of PPI has helped cough. Coughing about ten times a day with sputum. Associated with exertion and SOB. Drinks 1 cup of coffee a day; cut back on alcohol from 2-3 times/day to 2-3 times/week. No sinus drainage, congestion. Been on lisinopril  for 3 years. Slight runny nose last 3 weeks. Changed lisinopril  to valsartan  40 mg daily for the cough. If stopping ACEi doesn't help cough, will need to go to GI. Continue omeprazole .   07/18/2024 Discussed the use of AI scribe software for clinical note transcription with the patient, who gave verbal consent to proceed.  History of Present Illness Jeffery Underwood is a 68 year old male with asthma who presents with a productive cough and sinus symptoms.  He has experienced a productive cough for approximately three weeks, producing cream-colored phlegm. Initially, he sought care at an urgent care facility in [place] and was prescribed a course of antibiotics, likely azithromycin  based on dosing he recalls, which provided some improvement. However, the cough returned after flying back home, now accompanied by symptoms of a head cold and sinus drainage. He feels lousy and fatigued. He has chest tightness and feeling more short winded than usual. Occasional wheeze.   He continues to use Advair twice daily for asthma management and uses his rescue inhalers, albuterol  and Air Supra, about three to four times a day. He does not perceive a significant difference between the Airsupra  and albuterol   inhalers; understands to alternate between the two and not use at the same time. Additionally, he uses Flonase  nasal spray twice daily to manage sinus symptoms but has recently run out of this.  No fever or hemoptysis. Does have some sinus tenderness at times. He has been using honey as a natural remedy for his cough, which helps.  No chest pain, orthopnea, leg swelling. Eating and drinking well.   Lab Results  Component Value Date   NITRICOXIDE 25 07/18/2024   This result suggests intermediate (25-49) Type 2 (T2) airway inflammation; clinical correlation required.      08/23/2024- Interim hx  Discussed the use of AI scribe software for clinical note transcription with the patient, who gave verbal consent to proceed.  History of Present Illness Jeffery Underwood is a 68 year old male with asthma who presents with a chronic productive cough.  He is here for a follow-up visit after being treated for asthmatic bronchitis in October. He was prescribed Augmentin , prednisone  40 mg for five days, promethazine  DM, and Tessalon  Perles. While the treatment helped, he continues to experience a chronic productive cough that occurs approximately once an hour. The sputum is consistently colored, either cream or beige.  He has been on Dupixent  for about a year and questions whether it might be suppressing his immune system, contributing to his frequent illnesses. He notes that he has not experienced significant improvement with Dupixent . He has a history of asthma, but he does not experience wheezing or chest tightness, primarily dealing with mucus and cough. He has been using Advair twice daily and rinses  his mouth after use.  He has a history of reflux and is currently taking omeprazole  40 mg once daily. He has experienced mild dysmotility and a small hiatal hernia, with mild spontaneous reflux observed at the mid esophagus level. He has a referral to a gastroenterologist for further evaluation of his  esophagus.  He has a family history of smoking but has never smoked himself. He has undergone pulmonary function tests in the past, which he sometimes fails. He has also had a chest x-ray that showed clear lungs with a questionable small nodule in the right lower lung.  Prednisone  helps improve his symptoms, but he only takes it with antibiotics during flare-ups. He does not experience shortness of breath or wheezing, and his cough does not affect his workouts, though he finds it embarrassing due to its frequency. He sometimes has trouble swallowing and occasionally chokes on food, particularly carrots.  He has had COVID-19 two to three times, and his cough and respiratory issues seemed to start around that time. He has not been hospitalized for COVID-19.   No Known Allergies  Immunization History  Administered Date(s) Administered   Influenza-Unspecified 06/29/2022, 05/22/2024   Moderna Covid-19 Vaccine Bivalent Booster 59yrs & up 07/27/2021   PFIZER(Purple Top)SARS-COV-2 Vaccination 11/25/2019, 12/16/2019, 06/26/2020, 12/23/2020   PNEUMOCOCCAL CONJUGATE-20 06/26/2022   Pfizer(Comirnaty)Fall Seasonal Vaccine 12 years and older 05/18/2024    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   Right Anterior Neck   OSA (obstructive sleep apnea)    Prediabetes 09/21/2004   resolved 10/2011   Psoriasis    Recurrent upper respiratory infection (URI)    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   Right Sideburn (well diff)   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   Left Sideburn (in situ)   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   Left [place] (well diff)   Seasonal allergies     Tobacco History: Social History   Tobacco Use  Smoking Status Never  Smokeless Tobacco Never   Counseling given: Not  Answered   Outpatient Medications Prior to Visit  Medication Sig Dispense Refill   albuterol  (VENTOLIN  HFA) 108 (90 Base) MCG/ACT inhaler Inhale 2 puffs into the lungs as needed for wheezing or shortness of breath. 17 each 5   Albuterol -Budesonide (AIRSUPRA ) 90-80 MCG/ACT AERO INHALE 2 PUFFS INTO THE LUNGS EVERY 6 (SIX) HOURS AS NEEDED. 1 g 3   azelastine  (ASTELIN ) 0.1 % nasal spray Place 1 spray into both nostrils 2 (two) times daily. Use in each nostril as directed 30 mL 11   b complex vitamins capsule Take 1 capsule by mouth daily.     benzonatate  (TESSALON ) 200 MG capsule Take 1 capsule (200 mg total) by mouth 3 (three) times daily as needed for cough. 30 capsule 1   buPROPion (WELLBUTRIN XL) 300 MG 24 hr tablet Take 300 mg by mouth in the morning.     cetirizine  (ZYRTEC ) 10 MG tablet Take 1 tablet (10 mg total) by mouth daily. 30 tablet 11   diltiazem  (CARDIZEM  CD) 180 MG 24 hr capsule TAKE 1 CAPSULE EVERY DAY 90 capsule 3   Dupilumab  (DUPIXENT ) 300 MG/2ML SOAJ Inject 300 mg into the skin every 14 (fourteen) days. **loading dose completed in clinic on 08/16/23** 12 mL 1   ELIQUIS  5  MG TABS tablet TAKE 1 TABLET TWICE DAILY 180 tablet 3   fluticasone  (FLONASE ) 50 MCG/ACT nasal spray Place 2 sprays into both nostrils daily. 18.2 mL 2   fluticasone -salmeterol (ADVAIR HFA) 230-21 MCG/ACT inhaler Inhale 2 puffs into the lungs 2 (two) times daily. 36 g 3   IRON-VITAMIN C PO Take 1 tablet by mouth daily.     lidocaine  (LIDODERM ) 5 % Place 1 patch onto the skin daily. Remove & Discard patch within 12 hours or as directed by MD 30 patch 0   metoprolol  tartrate (LOPRESSOR ) 25 MG tablet TAKE 1/2 TABLET TWICE DAILY 90 tablet 3   montelukast  (SINGULAIR ) 10 MG tablet TAKE 1 TABLET AT BEDTIME 90 tablet 3   Multiple Vitamins-Minerals (MULTIVITAMIN PO) Take 1 tablet by mouth at bedtime.     omeprazole  (PRILOSEC) 40 MG capsule Take 1 capsule (40 mg total) by mouth daily. 30 capsule 3    promethazine -dextromethorphan (PROMETHAZINE -DM) 6.25-15 MG/5ML syrup Take 5 mLs by mouth 4 (four) times daily as needed for cough. 180 mL 0   Respiratory Therapy Supplies (CARETOUCH 2 CPAP HOSE HANGER) MISC See admin instructions.     rosuvastatin  (CRESTOR ) 10 MG tablet TAKE 1 TABLET EVERY DAY 90 tablet 3   valsartan  (DIOVAN ) 40 MG tablet Take 1 tablet (40 mg total) by mouth daily. 90 tablet 3   vitamin E 1000 UNIT capsule Take 1,000 Units by mouth at bedtime.     azelastine  (OPTIVAR ) 0.05 % ophthalmic solution Apply 1 drop to eye 2 (two) times daily as needed (itchy watery eyes). (Patient not taking: Reported on 08/23/2024) 6 mL 5   No facility-administered medications prior to visit.      Review of Systems  Review of Systems  Constitutional: Negative.   HENT:  Positive for congestion.   Respiratory:  Positive for cough. Negative for chest tightness, shortness of breath and wheezing.     Physical Exam  BP 124/60   Pulse 61   Temp 97.8 F (36.6 C)   Ht 5' 10 (1.778 m)   Wt 182 lb 12.8 oz (82.9 kg)   SpO2 97% Comment: ra  BMI 26.23 kg/m  Physical Exam Constitutional:      Appearance: Normal appearance.  HENT:     Head: Normocephalic and atraumatic.     Mouth/Throat:     Mouth: Mucous membranes are moist.     Pharynx: Oropharynx is clear.  Cardiovascular:     Rate and Rhythm: Normal rate and regular rhythm.  Pulmonary:     Effort: Pulmonary effort is normal.     Breath sounds: Normal breath sounds. No wheezing.  Skin:    General: Skin is warm and dry.  Neurological:     General: No focal deficit present.     Mental Status: He is oriented to person, place, and time. Mental status is at baseline.  Psychiatric:        Mood and Affect: Mood normal.        Behavior: Behavior normal.        Thought Content: Thought content normal.        Judgment: Judgment normal.      CBC    Component Value Date/Time   WBC 8.8 08/18/2024 0934   RBC 4.27 08/18/2024 0934   HGB  12.8 (L) 08/18/2024 0934   HGB 14.4 07/28/2021 1118   HCT 37.1 (L) 08/18/2024 0934   HCT 40.0 07/28/2021 1118   PLT 260 08/18/2024 0934   PLT 253 07/28/2021 1118  MCV 86.9 08/18/2024 0934   MCV 88 07/28/2021 1118   MCH 30.0 08/18/2024 0934   MCHC 34.5 08/18/2024 0934   RDW 13.9 08/18/2024 0934   RDW 12.2 07/28/2021 1118   LYMPHSABS 1.0 08/18/2024 0934   MONOABS 0.9 08/18/2024 0934   EOSABS 0.4 08/18/2024 0934   BASOSABS 0.0 08/18/2024 0934    BMET    Component Value Date/Time   NA 141 08/18/2024 0934   NA 137 07/28/2021 1118   K 4.2 08/18/2024 0934   CL 107 08/18/2024 0934   CO2 23 08/18/2024 0934   GLUCOSE 141 (H) 08/18/2024 0934   BUN 12 08/18/2024 0934   BUN 15 07/28/2021 1118   CREATININE 0.95 08/18/2024 0934   CALCIUM  9.7 08/18/2024 0934   GFRNONAA >60 08/18/2024 0934   GFRAA >60 11/29/2017 2236    BNP No results found for: BNP  ProBNP No results found for: PROBNP  Imaging: DG Chest 2 View Result Date: 08/18/2024 CLINICAL DATA:  Chest pain. EXAM: CHEST - 2 VIEW COMPARISON:  07/18/2024 FINDINGS: Lungs are clear without airspace disease or pulmonary edema. Questionable 5 mm nodule in the right lower lung that may have been present on the previous examination and likely an incidental finding. Heart size is normal. Trachea is midline. No large pleural effusions. IMPRESSION: 1. No acute cardiopulmonary disease. 2. Questionable 5 mm nodule in the right lower lung. This is likely an incidental finding. No follow-up needed if patient is low-risk. Non-contrast chest CT can be considered in 12 months if patient is high-risk. Electronically Signed   By: Juliene Balder M.D.   On: 08/18/2024 10:00     Assessment & Plan:    Assessment and Plan Assessment & Plan Chronic productive cough Persisting despite treatment with Augmentin , prednisone , promethazine  DM, and Tessalon  Perles. Cough is productive with cream or beige sputum, occurring once an hour. No wheezing or chest  tightness reported. Improvement with prednisone  suggests an asthmatic component. Differential includes cough variant asthma, asthmatic bronchitis, and potential contribution from gastroesophageal reflux disease. Dupixent  has been used for a year with minimal benefit. Eosinophils slightly elevated at 400, and phenolipid levels borderline at 25, indicating mild inflammation. No significant change in cough after stopping ACE inhibitors. Potential contribution from esophageal dysmotility and reflux. Gabapentin considered for neuropathic cough, with potential side effects including drowsiness, nausea, and headache. - Refilled Dupixent . - Consulted with Dr. Meade regarding potential switch to a different biologic or trial of gabapentin for chronic cough. - Continue current medications including Advair, Flonase , and omeprazole . - Await GI evaluation for esophageal dysmotility and reflux.  Cough variant asthma Suspected due to chronic productive cough and improvement with prednisone . Mild borderline FENO levels and slightly elevated eosinophils suggest mild airway inflammation. No wheezing or chest tightness reported. Dupixent  has been used for a year with minimal benefit. - Continue current asthma management with Advair and Flonase . - Consulted with Dr. Meade regarding potential switch to a different biologic.  Gastroesophageal reflux disease with esophageal dysmotility and hiatal hernia Gastroesophageal reflux disease with mild dysmotility, small hiatal hernia, and mild spontaneous reflux observed at the mid esophagus. Barium tablet stuck at the gastroesophageal junction. Potential contribution to chronic cough. Referral to GI for further evaluation is pending. - Await GI evaluation for esophageal dysmotility and reflux. - Continue omeprazole  40 mg once daily.  Stable right lower lung pulmonary nodule, likely incidental Right lower lung pulmonary nodule measuring 5 mm, likely incidental and unchanged from  previous exams. No smoking history, but family  history of smoking. No signs of pneumonia or pulmonary edema on chest x-ray.    Almarie LELON Ferrari, NP 08/23/2024

## 2024-08-24 ENCOUNTER — Other Ambulatory Visit: Payer: Self-pay | Admitting: Pharmacy Technician

## 2024-08-24 ENCOUNTER — Other Ambulatory Visit: Payer: Self-pay

## 2024-08-24 NOTE — Progress Notes (Signed)
 Specialty Pharmacy Refill Coordination Note  Jeffery Underwood is a 68 y.o. male contacted today regarding refills of specialty medication(s) Dupilumab  (Dupixent )   Patient requested (Patient-Rptd) Delivery   Delivery date: 08/31/2024 Verified address: (Patient-Rptd) [address] [place].  [address]   Medication will be filled on: 08/30/2024

## 2024-08-30 ENCOUNTER — Other Ambulatory Visit: Payer: Self-pay

## 2024-08-31 ENCOUNTER — Telehealth (HOSPITAL_BASED_OUTPATIENT_CLINIC_OR_DEPARTMENT_OTHER): Payer: Self-pay

## 2024-08-31 NOTE — Telephone Encounter (Signed)
° °  Pre-operative Risk Assessment    Patient Name: Jeffery Underwood  DOB: [birth year] MRN: [id number]   Date of last office visit: 11/23/2023 with Dr. Elmira Date of next office visit: None  Request for Surgical Clearance    Procedure:  Endoscopy with dil  Date of Surgery:  Clearance 10/03/24                                 Surgeon:  Dr. Estefana Keas Surgeon's Group or Practice Name:  [facility] Phone number:  [phone] Fax number:  [phone]   Type of Clearance Requested:   - Medical  - Pharmacy:  Hold Apixaban  (Eliquis ) -OK to hold 2 days prior?   Type of Anesthesia:  Propofol    Additional requests/questions:  None  Signed, Patrcia Iverson CROME   08/31/2024, 8:20 AM

## 2024-09-05 ENCOUNTER — Telehealth (HOSPITAL_BASED_OUTPATIENT_CLINIC_OR_DEPARTMENT_OTHER): Payer: Self-pay | Admitting: *Deleted

## 2024-09-05 DIAGNOSIS — R7301 Impaired fasting glucose: Secondary | ICD-10-CM | POA: Insufficient documentation

## 2024-09-05 DIAGNOSIS — H35039 Hypertensive retinopathy, unspecified eye: Secondary | ICD-10-CM | POA: Insufficient documentation

## 2024-09-05 DIAGNOSIS — F320 Major depressive disorder, single episode, mild: Secondary | ICD-10-CM | POA: Insufficient documentation

## 2024-09-05 DIAGNOSIS — E781 Pure hyperglyceridemia: Secondary | ICD-10-CM | POA: Insufficient documentation

## 2024-09-05 DIAGNOSIS — F339 Major depressive disorder, recurrent, unspecified: Secondary | ICD-10-CM | POA: Insufficient documentation

## 2024-09-05 DIAGNOSIS — I700 Atherosclerosis of aorta: Secondary | ICD-10-CM | POA: Insufficient documentation

## 2024-09-05 DIAGNOSIS — J4520 Mild intermittent asthma, uncomplicated: Secondary | ICD-10-CM | POA: Insufficient documentation

## 2024-09-05 NOTE — Telephone Encounter (Signed)
° °  Name: Gloria Lambertson  DOB: 26-Mar-1956  MRN: [id number]  Primary Cardiologist: Newman JINNY Lawrence, MD  Chart reviewed as part of pre-operative protocol coverage. Because of Aundre Hietala past medical history and time since last visit, he will require a follow-up Telephone visit in order to better assess preoperative cardiovascular risk.  Pre-op covering staff: - Please schedule appointment and call patient to inform them. If patient already had an upcoming appointment within acceptable timeframe, please add pre-op clearance to the appointment notes so provider is aware. - Please contact requesting surgeon's office via preferred method (i.e, phone, fax) to inform them of need for appointment prior to surgery.  Per office protocol, patient can hold Eliquis  for 2 days prior to procedure.  Please resume when medically safe to do so.  I also confirmed the patient resides in the state of [place] . As per [facility] Medical Board telemedicine laws, the patient must reside in the state in which the provider is licensed.    Orren LOISE Fabry, PA-C  09/05/2024, 12:51 PM

## 2024-09-05 NOTE — Telephone Encounter (Signed)
 Pt has been scheduled tele preop appt 09/20/24. Med rec and consent are done.

## 2024-09-05 NOTE — Telephone Encounter (Signed)
 Pt has been scheduled tele preop appt 09/20/24. Med rec and consent are done.     Patient Consent for Virtual Visit        Chanceler Pullin has provided verbal consent on 09/05/2024 for a virtual visit (video or telephone).   CONSENT FOR VIRTUAL VISIT FOR:  Deward Moats  By participating in this virtual visit I agree to the following:  I hereby voluntarily request, consent and authorize [facility] and its employed or contracted physicians, physician assistants, nurse practitioners or other licensed health care professionals (the Practitioner), to provide me with telemedicine health care services (the Services) as deemed necessary by the treating Practitioner. I acknowledge and consent to receive the Services by the Practitioner via telemedicine. I understand that the telemedicine visit will involve communicating with the Practitioner through live audiovisual communication technology and the disclosure of certain medical information by electronic transmission. I acknowledge that I have been given the opportunity to request an in-person assessment or other available alternative prior to the telemedicine visit and am voluntarily participating in the telemedicine visit.  I understand that I have the right to withhold or withdraw my consent to the use of telemedicine in the course of my care at any time, without affecting my right to future care or treatment, and that the Practitioner or I may terminate the telemedicine visit at any time. I understand that I have the right to inspect all information obtained and/or recorded in the course of the telemedicine visit and may receive copies of available information for a reasonable fee.  I understand that some of the potential risks of receiving the Services via telemedicine include:  Delay or interruption in medical evaluation due to technological equipment failure or disruption; Information transmitted may not be sufficient (e.g. poor resolution of  images) to allow for appropriate medical decision making by the Practitioner; and/or  In rare instances, security protocols could fail, causing a breach of personal health information.  Furthermore, I acknowledge that it is my responsibility to provide information about my medical history, conditions and care that is complete and accurate to the best of my ability. I acknowledge that Practitioner's advice, recommendations, and/or decision may be based on factors not within their control, such as incomplete or inaccurate data provided by me or distortions of diagnostic images or specimens that may result from electronic transmissions. I understand that the practice of medicine is not an exact science and that Practitioner makes no warranties or guarantees regarding treatment outcomes. I acknowledge that a copy of this consent can be made available to me via my patient portal [facility] MyChart), or I can request a printed copy by calling the office of [facility].    I understand that my insurance will be billed for this visit.   I have read or had this consent read to me. I understand the contents of this consent, which adequately explains the benefits and risks of the Services being provided via telemedicine.  I have been provided ample opportunity to ask questions regarding this consent and the Services and have had my questions answered to my satisfaction. I give my informed consent for the services to be provided through the use of telemedicine in my medical care

## 2024-09-05 NOTE — Telephone Encounter (Signed)
 Patient with diagnosis of A fib on Eliquis  for anticoagulation.    Procedure:  Endoscopy with dil  Date of procedure: 10/03/24   CHA2DS2-VASc Score = 3  This indicates a 3.2% annual risk of stroke. The patient's score is based upon: CHF History: 0 HTN History: 1 Diabetes History: 0 Stroke History: 0 Vascular Disease History: 1 Age Score: 1 Gender Score: 0    CrCl 86 ml/min Platelet count 260K  Patient has not had an Afib/aflutter ablation in the last 3 months, DCCV within the last 4 weeks or a watchman implanted in the last 45 days    Per office protocol, patient can hold Eliquis  for 2 days prior to procedure.    **This guidance is not considered finalized until pre-operative APP has relayed final recommendations.**

## 2024-09-19 ENCOUNTER — Other Ambulatory Visit (HOSPITAL_COMMUNITY): Payer: Self-pay

## 2024-09-20 ENCOUNTER — Ambulatory Visit: Attending: Internal Medicine | Admitting: Physician Assistant

## 2024-09-20 ENCOUNTER — Other Ambulatory Visit: Payer: Self-pay

## 2024-09-20 DIAGNOSIS — Z01810 Encounter for preprocedural cardiovascular examination: Secondary | ICD-10-CM | POA: Diagnosis not present

## 2024-09-20 NOTE — Progress Notes (Signed)
 "   Virtual Visit via Telephone Note   Because of Jeffery Underwood co-morbid illnesses, he is at least at moderate risk for complications without adequate follow up.  This format is felt to be most appropriate for this patient at this time.  Due to technical limitations with video connection (technology), today'Underwood appointment will be conducted as an audio only telehealth visit, and Jeffery Underwood verbally agreed to proceed in this manner.   All issues noted in this document were discussed and addressed.  No physical exam could be performed with this format.  Evaluation Performed:  Preoperative cardiovascular risk assessment _____________   Date:  09/20/2024   Patient ID:  Jeffery Underwood, DOB 1956-01-16, MRN [id number] Patient Location:  Home Provider location:   Office  Primary Care Provider:  Verena Mems, MD Primary Cardiologist:  Jeffery JINNY Lawrence, MD  Chief Complaint / Patient Profile   68 y.o. y/o male with a h/o hypertension, OSA on CPAP, persistent atypical atrial flutter/fibrillation, elevated CAC  who is pending endoscopy with DIL and presents today for telephonic preoperative cardiovascular risk assessment.  History of Present Illness    Jeffery Underwood is a 68 y.o. male who presents via audio/video conferencing for a telehealth visit today.  Pt was last seen in cardiology clinic on 11/23/23 by Dr. Lawrence.  At that time Jeffery Underwood was doing well other than some spiking blood pressures and jitteriness after stopping metoprolol  lisinopril  few months ago.  The patient is now pending procedure as outlined above. Since his last visit, he says he has been doing well from a heart standpoint.  No chest pain or shortness of breath.  He has a wearable device that shows atrial fibrillation less than 2% of the time.  He is asymptomatic.  He walks 4 miles a day.  He goes to the gym 3 days a week usually doing 2025 minutes of elliptical and weight machines.  For this reason, he meets minimum METS for  clearance.   Per office protocol patient hold Eliquis  for 2 days prior to procedure.  Please resume when medically safe to do so.  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   Right Anterior Neck   OSA (obstructive sleep apnea)    Prediabetes 09/21/2004   resolved 10/2011   Psoriasis    Recurrent upper respiratory infection (URI)    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   Right Sideburn (well diff)   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   Left Sideburn (in situ)   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   Left [place] (well diff)   Seasonal allergies    Past Surgical History:  Procedure Laterality Date   CARDIOVERSION N/A 08/05/2021   Procedure: CARDIOVERSION;  Surgeon: Jeffery Heinz, MD;  Location: [facility] ENDOSCOPY;  Service: Cardiovascular;  Laterality: N/A;   INGUINAL HERNIA REPAIR     right side (4-5 years ago)   mohs Right     Allergies  Allergies[1]  Home Medications    Prior to Admission medications  Medication Sig Start Date End Date Taking? Authorizing Provider  albuterol  (VENTOLIN  HFA) 108 (90 Base) MCG/ACT inhaler Inhale 2 puffs into the lungs as needed for wheezing or shortness of breath. 08/04/23   Jeffery Verdon RAMAN, MD  Albuterol -Budesonide (AIRSUPRA )  90-80 MCG/ACT AERO INHALE 2 PUFFS INTO THE LUNGS EVERY 6 (SIX) HOURS AS NEEDED. 08/16/24   Jeffery Verdon RAMAN, MD  azelastine  (ASTELIN ) 0.1 % nasal spray Place 1 spray into both nostrils 2 (two) times daily. Use in each nostril as directed 10/13/22   Jeffery Arleta SQUIBB, MD  azelastine  (OPTIVAR ) 0.05 % ophthalmic solution Apply 1 drop to eye 2 (two) times daily as needed (itchy watery eyes). 10/13/22   Jeffery Arleta SQUIBB, MD  b complex vitamins capsule Take 1 capsule by mouth daily.    [provider]   benzonatate  (TESSALON ) 200 MG capsule Take 1 capsule (200 mg total) by mouth 3 (three) times daily as needed for cough. 07/18/24   Cobb, Comer GAILS, NP  buPROPion (WELLBUTRIN XL) 300 MG 24 hr tablet Take 300 mg by mouth in the morning. 07/29/21   [provider]  cetirizine  (ZYRTEC ) 10 MG tablet Take 1 tablet (10 mg total) by mouth daily. 10/13/22   Jeffery Arleta SQUIBB, MD  diltiazem  (CARDIZEM  CD) 180 MG 24 hr capsule TAKE 1 CAPSULE EVERY DAY 12/10/23   Jeffery Heinz, MD  Dupilumab  (DUPIXENT ) 300 MG/2ML SOAJ Inject 300 mg into the skin every 14 (fourteen) days. **loading dose completed in clinic on 08/16/23** 07/26/24   Jeffery Verdon RAMAN, MD  ELIQUIS  5 MG TABS tablet TAKE 1 TABLET TWICE DAILY 02/21/24   Patwardhan, Jeffery PARAS, MD  fluticasone  (FLONASE ) 50 MCG/ACT nasal spray Place 2 sprays into both nostrils daily. 07/18/24   Cobb, Comer GAILS, NP  fluticasone -salmeterol (ADVAIR HFA) 230-21 MCG/ACT inhaler Inhale 2 puffs into the lungs 2 (two) times daily. 04/27/24   Desai, Nikita S, MD  IRON-VITAMIN C PO Take 1 tablet by mouth daily.    [provider]  lidocaine  (LIDODERM ) 5 % Place 1 patch onto the skin daily. Remove & Discard patch within 12 hours or as directed by MD 08/18/24   Jeffery Palma, PA-C  metoprolol  tartrate (LOPRESSOR ) 25 MG tablet TAKE 1/2 TABLET TWICE DAILY 04/06/24   Patwardhan, Jeffery PARAS, MD  montelukast  (SINGULAIR ) 10 MG tablet TAKE 1 TABLET AT BEDTIME 03/13/24   Desai, Nikita S, MD  Multiple Vitamins-Minerals (MULTIVITAMIN PO) Take 1 tablet by mouth at bedtime.    [provider]  omeprazole  (PRILOSEC) 40 MG capsule TAKE 1 CAPSULE EVERY DAY 08/24/24   Desai, Nikita S, MD  promethazine -dextromethorphan (PROMETHAZINE -DM) 6.25-15 MG/5ML syrup Take 5 mLs by mouth 4 (four) times daily as needed for cough. 07/18/24   Cobb, Comer GAILS, NP  Respiratory Therapy Supplies (CARETOUCH 2 CPAP HOSE HANGER) MISC See admin instructions.    [provider]  rosuvastatin   (CRESTOR ) 10 MG tablet TAKE 1 TABLET EVERY DAY 06/19/24   Patwardhan, Manish J, MD  valsartan  (DIOVAN ) 40 MG tablet Take 1 tablet (40 mg total) by mouth daily. 03/07/24   Patwardhan, Jeffery PARAS, MD  vitamin E 1000 UNIT capsule Take 1,000 Units by mouth at bedtime.    [provider]    Physical Exam    Vital Signs:  Jeffery Underwood does not have vital signs available for review today.  Given telephonic nature of communication, physical exam is limited. AAOx3. NAD. Normal affect.  Speech and respirations are unlabored.  Accessory Clinical Findings    None  Assessment & Plan    1.  Preoperative Cardiovascular Risk Assessment:  Jeffery Underwood perioperative risk of a major cardiac event is 0.4% according to the Revised Cardiac Risk Index (RCRI).  Therefore, he is at low risk for  perioperative complications.   His functional capacity is good at 5.62 METs according to the Duke Activity Status Index (DASI). Recommendations: According to ACC/AHA guidelines, no further cardiovascular testing needed.  The patient may proceed to surgery at acceptable risk.   Antiplatelet and/or Anticoagulation Recommendations:  Eliquis  (Apixaban ) can be held for 2 days prior to surgery.  Please resume post op when felt to be safe.     The patient was advised that if he develops new symptoms prior to surgery to contact our office to arrange for a follow-up visit, and he verbalized understanding.   A copy of this note will be routed to requesting surgeon.  Time:   Today, I have spent 6 minutes with the patient with telehealth technology discussing medical history, symptoms, and management plan.     Jeffery LOISE Fabry, PA-C  09/20/2024, 8:11 AM      [1] No Known Allergies  "

## 2024-09-22 ENCOUNTER — Other Ambulatory Visit: Payer: Self-pay

## 2024-09-22 NOTE — Progress Notes (Signed)
 Specialty Pharmacy Refill Coordination Note  Jeffery Underwood is a 69 y.o. male contacted today regarding refills of specialty medication(s) Dupilumab  (Dupixent )   Patient requested Delivery   Delivery date: 09/27/24   Verified address: [address]. [place].  [address]   Medication will be filled on: 09/26/24

## 2024-09-24 ENCOUNTER — Other Ambulatory Visit: Payer: Self-pay | Admitting: Cardiology

## 2024-09-24 DIAGNOSIS — I484 Atypical atrial flutter: Secondary | ICD-10-CM

## 2024-09-25 ENCOUNTER — Telehealth: Payer: Self-pay | Admitting: Primary Care

## 2024-09-25 MED ORDER — GABAPENTIN 100 MG PO CAPS: 100.0000 mg | ORAL_CAPSULE | Freq: Every day | ORAL | 1 refills | Status: AC

## 2024-09-25 NOTE — Telephone Encounter (Signed)
-----   Message from Verdon Gore, MD sent at 08/29/2024  9:53 AM EST ----- Regarding: RE: Chronic cough/asthma Yeah this is all reflux. Ok to stop dupixent  if no improvement after a year. Ok to trial gabapentin . ----- Message ----- From: Hope Almarie ORN, NP Sent: 08/29/2024   9:25 AM EST To: Verdon GORMAN Gore, MD Subject: Chronic cough/asthma                           Patient of yours with persistent chronic productive cough despite treatment with Augmentin , prednisone , promethazine  DM, and Tessalon  Perles. Cough is productive with cream or beige sputum, occurring once an hour. No wheezing or chest tightness reported. Improvement with prednisone  suggests an asthmatic component. Dupixent  has been used for a year with minimal benefit. Eosinophils slightly elevated at 400, and FENO levels borderline at 25. No significant change in cough after stopping ACE inhibitors. Potential contribution from esophageal dysmotility and reflux.   Would you be ok with trial of gabapentin ? Should he continue dupixent  or try a different biologic? Awaiting GI evaluation   -Beth

## 2024-09-25 NOTE — Telephone Encounter (Signed)
 Please let patient know I spoke with Dr. Meade and she agree cough was related to reflux and we can stop Dupixent  if he has not seen an improved since being on for 1 year and trial gabapentin  100mg  at bedtime. I ave sent an order. Please remove dupixent  if patient is ok with stopping and schedule follow-up with new pulmonary MD in the next 6-8 weeks

## 2024-09-26 ENCOUNTER — Other Ambulatory Visit: Payer: Self-pay

## 2024-09-26 NOTE — Telephone Encounter (Signed)
ATC x1.  LVM to return call. 

## 2024-09-26 NOTE — Telephone Encounter (Signed)
 ATC x2.  Left detailed message per DPR.  Requested that he return call to discuss further.  Sent mychart message with information.

## 2024-09-27 NOTE — Progress Notes (Addendum)
 "      OV 09/28/2024  Subjective:  Patient ID: Jeffery Underwood, male , DOB: 04-Nov-1955 , age 69 y.o. , MRN: [id number] , ADDRESS: [address] [place] [facility] [id number] PCP Verena Mems, MD Patient Care Team: Verena Mems, MD as PCP - General (Family Medicine) Elmira Newman PARAS, MD as PCP - Cardiology (Cardiology) Porter Andrez SAUNDERS, PA-C (Inactive) as Physician Assistant (Dermatology)  This Provider for this visit: Treatment Team:  Attending Provider: Geronimo Amel, MD    09/28/2024 -   Chief Complaint  Patient presents with   Medical Management of Chronic Issues   Asthma    Breathing is overall doing well. He c/o prod cough with light yellow sputum. He has occ wheezing.      HPI Jeffery Underwood 69 y.o. -[name] is a 69 year old male with asthma who presents with a chronic productive cough. He was previously under the care of Dr. Verdon Underwood for his respiratory issues. Wife Jeffery Underwood is indy historian  He has experienced a chronic productive cough for four to five years, occurring daily and approximately ten times an hour. The cough produces creamy, sticky phlegm. It does not disturb his sleep and is not exacerbated by talking, but can be triggered by laughing, eating, or sudden exertion. He also experiences hoarseness and frequently clears his throat. STarted after covid. He has experienced multiple respiratory infections, including three episodes of COVID-19 and several instances of pneumonia. He tends to get sick easily, with two respiratory illnesses in the fall and one in the summer.He has a family history of respiratory issues, with his mother and grandfather being smokers who coughed frequently. He himself was exposed to smokers but does not currently smoke.  He has a history of asthma since childhood and is currently on Dupixent  for the past thirteen months, which was prescribed for the cough. Initially, the cough seemed to improve with Dupixent  but then returned to  baseline. He also uses a CPAP machine at night. He does NOT feel cough is related to asthma.  He is seen at the [facility]  clinic.  He was referred in December 2023 by Dr. Gore.  He was referred for his allergic rhinitis.  At that time medication showed he was taking lisinopril .  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  He was seen by Dr. Arleta Blanch   He has undergone a cardiac stress test and can exercise, such as using an elliptical for 25 minutes, without significant issues. However, he experiences total fatigue with heavy yard work or sudden exertion. This is per hx  He is scheduled for an endoscopy next week due to a sensation of pills sticking in his esophagus, and he has been on omeprazole  40 mg daily for acid reflux, which may contribute to his cough.    Dr Felice Reflux Symptom Index (> 13-15 suggestive of LPR cough) 0 -> 5  =  none ->severe problem  Hoarseness of problem with voice 5  Clearing  Of Throat 5  Excess throat mucus or feeling of post nasal drip 3  Difficulty swallowing food, liquid or tablets 5  Cough after eating or lying down 3  Breathing difficulties or choking episodes 4  Troublesome or annoying cough 5  Sensation of something sticking in throat or lump in throat 5  Heartburn, chest pain, indigestion, or stomach acid coming up 1  TOTAL 36  CXR 08/18/24   IMPRESSION: 1. No acute cardiopulmonary disease. 2. Questionable 5 mm nodule in the right lower lung. This is likely an incidental finding. No follow-up needed if patient is low-risk. Non-contrast chest CT can be considered in 12 months if patient is high-risk.     Electronically Signed   By: Juliene Balder M.D.   On: 08/18/2024 10:00   NEW ANEMIA 12.8 in nov 2025   Latest Reference Range & Units 03/27/12 07:30 11/29/17 22:36 06/21/22 15:45 08/18/24 09:34  Eosinophils Absolute 0.0 -  0.5 K/uL 0.1 0.2 0.0 0.4   CT May 2024   Lungs/Pleura: There is no pleural effusion. The visualized lungs appear clear.   Upper abdomen: No significant findings in the visualized upper abdomen.   Musculoskeletal/Chest wall: No chest wall mass or suspicious osseous findings within the visualized chest.   IMPRESSION: Aortic Atherosclerosis (ICD10-I70.0).     Electronically Signed   By: Oneil Devonshire M.D.   On: 02/02/2023 17:21   has a past medical history of ADHD (attention deficit hyperactivity disorder), Arrhythmia, Asthma, Depression, Hypertriglyceridemia, Nodular basal cell carcinoma (BCC) (03/19/2021), OSA (obstructive sleep apnea), Prediabetes (09/21/2004), Psoriasis, Recurrent upper respiratory infection (URI), SCCA (squamous cell carcinoma) of skin (03/19/2021), SCCA (squamous cell carcinoma) of skin (03/19/2021), SCCA (squamous cell carcinoma) of skin (03/19/2021), and Seasonal allergies.   reports that he has never smoked. He has never used smokeless tobacco.  Past Surgical History:  Procedure Laterality Date   CARDIOVERSION N/A 08/05/2021   Procedure: CARDIOVERSION;  Surgeon: Ladona Heinz, MD;  Location: [facility] ENDOSCOPY;  Service: Cardiovascular;  Laterality: N/A;   INGUINAL HERNIA REPAIR     right side (4-5 years ago)   mohs Right     Allergies[1]  Immunization History  Administered Date(s) Administered   Influenza-Unspecified 06/29/2022, 05/22/2024   Moderna Covid-19 Vaccine Bivalent Booster 22yrs & up 07/27/2021   PFIZER(Purple Top)SARS-COV-2 Vaccination 11/25/2019, 12/16/2019, 06/26/2020, 12/23/2020   PNEUMOCOCCAL CONJUGATE-20 06/26/2022   Pfizer(Comirnaty)Fall Seasonal Vaccine 12 years and older 05/18/2024    Family History  Problem Relation Age of Onset   Asthma Mother    Depression Mother    Tuberculosis Mother    Osteoporosis Mother    Diabetes type II Father    Atrial fibrillation Father     Current Medications[2]      Objective:   Vitals:    09/28/24 1345  BP: 120/68  Pulse: 64  SpO2: 98%  Weight: 184 lb (83.5 kg)  Height: 5' 10 (1.778 m)    Estimated body mass index is 26.4 kg/m as calculated from the following:   Height as of this encounter: 5' 10 (1.778 m).   Weight as of this encounter: 184 lb (83.5 kg).  @WEIGHTCHANGE @  [employer]   09/28/24 1345  Weight: 184 lb (83.5 kg)     Physical Exam   General: No distress. Looks well O2 at rest: no Cane present: no Sitting in wheel chair: no Frail: o Obese: no Neuro: Alert and Oriented x 3. GCS 15. Speech normal Psych: Pleasant Resp:  Barrel Chest - no.  Wheeze - no, Crackles - no, No overt respiratory distress CVS: Normal heart sounds. Murmurs - no Ext: Stigmata of Connective Tissue Disease - no HEENT: Normal upper airway. PEERL +. No post nasal drip        Assessment/     Assessment & Plan Chronic cough  Irritable larynx  History of seasonal allergies  History of asthma  DOE (dyspnea on exertion)  Dysphagia, unspecified  type  History of anemia    PLAN Patient Instructions     ICD-10-CM   1. Chronic cough  R05.3     2. Irritable larynx  J38.7     3. History of seasonal allergies  Z88.9     4. DOE (dyspnea on exertion)  R06.09     5. Dysphagia, unspecified type  R13.10     6. History of anemia  Z86.2     7. History of asthma  Z87.09      Chronic cough Irritable larynx  - definitel element of cough neuropathy or chronic refracotry cough. Some of the features are classic  Plan  - hold off gabapentin  for th emoment  - finish workup below  - full PFT  - HRCT - take consent for ROCCPORT study  History of seasonal allergies History of asthma  - noted your thoughts that cough feels difernt from asthma and allergies - you are on dupixent  x 13 months as of 09/28/2024 without relief I cough  Plan  - ok for dupixent  for now - sign release to get records from [place] allergy   DOE (dyspnea on exertion) History of  anemia - Nov 2025  - likely multifactorial  lPlan  - check Cbc with diff to reassess anemi - await CT chest results    Dysphagia, unspecified type  PLAN  - go through GI workup at [facility] - hold off on neurontin  till this Is sorted out   Followup 6-8 weeks wti DR Ramawamy but after PFT and HRCT; 15 min - cough score at folluwo    FOLLOWUP    Return for 6-8 weeks wti DR Ramawamy but after PFT and HRCT; 15 min.  ( Level 05 visit E&M 2024: Estb >= 40 min  in  visit type: on-site physical face to visit  in total care time and counseling or/and coordination of care by this undersigned MD - Dr Dorethia Cave. This includes one or more of the following on this same day 09/28/2024: pre-charting, chart review, note writing, documentation discussion of test results, diagnostic or treatment recommendations, prognosis, risks and benefits of management options, instructions, education, compliance or risk-factor reduction. It excludes time spent by the CMA or office staff in the care of the patient. Actual time 40 min)   SIGNATURE    Dr. Dorethia Cave, M.D., F.C.C.P,  Pulmonary and Critical Care Medicine Staff Physician, [facility] Director - Interstitial Lung Disease  Program  Pulmonary Fibrosis [facility] at [facility] [place], [place], [address]  Pager: [phone], If no answer or between  15:00h - 7:00h: call [phone] Telephone: [phone]  2:31 PM 09/28/2024     [1] No Known Allergies [2]  Current Outpatient Medications:    albuterol  (VENTOLIN  HFA) 108 (90 Base) MCG/ACT inhaler, Inhale 2 puffs into the lungs as needed for wheezing or shortness of breath., Disp: 17 each, Rfl: 5   Albuterol -Budesonide (AIRSUPRA ) 90-80 MCG/ACT AERO, INHALE 2 PUFFS INTO THE LUNGS EVERY 6 (SIX) HOURS AS NEEDED., Disp: 1 g, Rfl: 3   azelastine  (ASTELIN ) 0.1 % nasal spray, Place 1 spray into both nostrils 2 (two) times daily. Use in each nostril as  directed, Disp: 30 mL, Rfl: 11   azelastine  (OPTIVAR ) 0.05 % ophthalmic solution, Apply 1 drop to eye 2 (two) times daily as needed (itchy watery eyes)., Disp: 6 mL, Rfl: 5   b complex vitamins capsule, Take 1 capsule by mouth daily., Disp: ,  Rfl:    benzonatate  (TESSALON ) 200 MG capsule, Take 1 capsule (200 mg total) by mouth 3 (three) times daily as needed for cough., Disp: 30 capsule, Rfl: 1   buPROPion (WELLBUTRIN XL) 300 MG 24 hr tablet, Take 300 mg by mouth in the morning., Disp: , Rfl:    cetirizine  (ZYRTEC ) 10 MG tablet, Take 1 tablet (10 mg total) by mouth daily., Disp: 30 tablet, Rfl: 11   diltiazem  (CARDIZEM  CD) 180 MG 24 hr capsule, TAKE 1 CAPSULE EVERY DAY, Disp: 90 capsule, Rfl: 3   Dupilumab  (DUPIXENT ) 300 MG/2ML SOAJ, Inject 300 mg into the skin every 14 (fourteen) days. **loading dose completed in clinic on 08/16/23**, Disp: 12 mL, Rfl: 1   ELIQUIS  5 MG TABS tablet, TAKE 1 TABLET TWICE DAILY, Disp: 180 tablet, Rfl: 3   fluticasone  (FLONASE ) 50 MCG/ACT nasal spray, Place 2 sprays into both nostrils daily., Disp: 18.2 mL, Rfl: 2   fluticasone -salmeterol (ADVAIR HFA) 230-21 MCG/ACT inhaler, Inhale 2 puffs into the lungs 2 (two) times daily., Disp: 36 g, Rfl: 3   IRON-VITAMIN C PO, Take 1 tablet by mouth daily., Disp: , Rfl:    lidocaine  (LIDODERM ) 5 %, Place 1 patch onto the skin daily. Remove & Discard patch within 12 hours or as directed by MD, Disp: 30 patch, Rfl: 0   metoprolol  tartrate (LOPRESSOR ) 25 MG tablet, TAKE 1/2 TABLET TWICE DAILY, Disp: 90 tablet, Rfl: 3   montelukast  (SINGULAIR ) 10 MG tablet, TAKE 1 TABLET AT BEDTIME, Disp: 90 tablet, Rfl: 3   Multiple Vitamins-Minerals (MULTIVITAMIN PO), Take 1 tablet by mouth at bedtime., Disp: , Rfl:    omeprazole  (PRILOSEC) 40 MG capsule, TAKE 1 CAPSULE EVERY DAY, Disp: 90 capsule, Rfl: 3   promethazine -dextromethorphan (PROMETHAZINE -DM) 6.25-15 MG/5ML syrup, Take 5 mLs by mouth 4 (four) times daily as needed for cough., Disp: 180  mL, Rfl: 0   Respiratory Therapy Supplies (CARETOUCH 2 CPAP HOSE HANGER) MISC, See admin instructions., Disp: , Rfl:    rosuvastatin  (CRESTOR ) 10 MG tablet, TAKE 1 TABLET EVERY DAY, Disp: 90 tablet, Rfl: 3   valsartan  (DIOVAN ) 40 MG tablet, Take 1 tablet (40 mg total) by mouth daily., Disp: 90 tablet, Rfl: 3   vitamin E 1000 UNIT capsule, Take 1,000 Units by mouth at bedtime., Disp: , Rfl:    gabapentin  (NEURONTIN ) 100 MG capsule, Take 1 capsule (100 mg total) by mouth at bedtime. (Patient not taking: Reported on 09/28/2024), Disp: 30 capsule, Rfl: 1  "

## 2024-09-27 NOTE — Telephone Encounter (Signed)
 Copied from CRM #[id number]. Topic: Clinical - Medical Advice >> Sep 26, 2024 10:19 AM Joesph PARAS wrote: Reason for CRM: Patient is returning call to [facility]. Patient requesting return call if the subject cannot wait until appointment he has to [facility] to Dr. Geronimo on 09/28/2024. If subject can wait, patient would like to address at upcoming TOC.   See message from pt:  Powell, thank you for calling and leaving the message. I am scheduled to see Dr. Geronimo on Thursday and I will discuss with him.  Pt will discuss with Dr. Geronimo on 09/28/24. NFN.

## 2024-09-28 ENCOUNTER — Ambulatory Visit (INDEPENDENT_AMBULATORY_CARE_PROVIDER_SITE_OTHER): Admitting: Internal Medicine

## 2024-09-28 ENCOUNTER — Encounter: Payer: Self-pay | Admitting: Internal Medicine

## 2024-09-28 ENCOUNTER — Telehealth: Payer: Self-pay | Admitting: Internal Medicine

## 2024-09-28 VITALS — BP 120/68 | HR 64 | Ht 70.0 in | Wt 184.0 lb

## 2024-09-28 DIAGNOSIS — R053 Chronic cough: Secondary | ICD-10-CM

## 2024-09-28 DIAGNOSIS — J45909 Unspecified asthma, uncomplicated: Secondary | ICD-10-CM | POA: Diagnosis not present

## 2024-09-28 DIAGNOSIS — Z8709 Personal history of other diseases of the respiratory system: Secondary | ICD-10-CM

## 2024-09-28 DIAGNOSIS — J387 Other diseases of larynx: Secondary | ICD-10-CM

## 2024-09-28 DIAGNOSIS — Z862 Personal history of diseases of the blood and blood-forming organs and certain disorders involving the immune mechanism: Secondary | ICD-10-CM | POA: Diagnosis not present

## 2024-09-28 DIAGNOSIS — J4540 Moderate persistent asthma, uncomplicated: Secondary | ICD-10-CM

## 2024-09-28 DIAGNOSIS — R0609 Other forms of dyspnea: Secondary | ICD-10-CM

## 2024-09-28 DIAGNOSIS — R1310 Dysphagia, unspecified: Secondary | ICD-10-CM | POA: Diagnosis not present

## 2024-09-28 DIAGNOSIS — Z889 Allergy status to unspecified drugs, medicaments and biological substances status: Secondary | ICD-10-CM

## 2024-09-28 LAB — CBC WITH DIFFERENTIAL/PLATELET
Basophils Absolute: 0 K/uL (ref 0.0–0.1)
Basophils Relative: 0.2 % (ref 0.0–3.0)
Eosinophils Absolute: 0.1 K/uL (ref 0.0–0.7)
Eosinophils Relative: 1 % (ref 0.0–5.0)
HCT: 39.8 % (ref 39.0–52.0)
Hemoglobin: 13.8 g/dL (ref 13.0–17.0)
Lymphocytes Relative: 11.6 % — ABNORMAL LOW (ref 12.0–46.0)
Lymphs Abs: 1.4 K/uL (ref 0.7–4.0)
MCHC: 34.5 g/dL (ref 30.0–36.0)
MCV: 87.3 fl (ref 78.0–100.0)
Monocytes Absolute: 0.9 K/uL (ref 0.1–1.0)
Monocytes Relative: 7.7 % (ref 3.0–12.0)
Neutro Abs: 9.6 K/uL — ABNORMAL HIGH (ref 1.4–7.7)
Neutrophils Relative %: 79.5 % — ABNORMAL HIGH (ref 43.0–77.0)
Platelets: 313 K/uL (ref 150.0–400.0)
RBC: 4.56 Mil/uL (ref 4.22–5.81)
RDW: 14.3 % (ref 11.5–15.5)
WBC: 12 K/uL — ABNORMAL HIGH (ref 4.0–10.5)

## 2024-09-28 NOTE — Patient Instructions (Addendum)
"    ICD-10-CM   1. Chronic cough  R05.3     2. Irritable larynx  J38.7     3. History of seasonal allergies  Z88.9     4. DOE (dyspnea on exertion)  R06.09     5. Dysphagia, unspecified type  R13.10     6. History of anemia  Z86.2     7. History of asthma  Z87.09      Chronic cough Irritable larynx  - definitel element of cough neuropathy or chronic refracotry cough. Some of the features are classic  Plan  - hold off gabapentin  for th emoment  - finish workup below  - full PFT  - HRCT - take consent for ROCCPORT study  History of seasonal allergies History of asthma  - noted your thoughts that cough feels difernt from asthma and allergies - you are on dupixent  x 13 months as of 09/28/2024 without relief I cough  Plan  - ok for dupixent  for now - sign release to get records from [place] allergy   DOE (dyspnea on exertion) History of anemia - Nov 2025  - likely multifactorial  lPlan  - check Cbc with diff to reassess anemi - await CT chest results    Dysphagia, unspecified type  PLAN  - go through [facility] workup at [facility] - hold off on neurontin  till this Is sorted out   Followup 6-8 weeks wti DR Ramawamy but after PFT and HRCT; 15 min - cough score at folluwo "

## 2024-09-28 NOTE — Telephone Encounter (Signed)
 Please check with the patient if he still taking lisinopril .  It was that on his med list in 2023.  I do not see it right now.  But at the same time it is not listed as an allergy .  He does have chronic cough  Let me know

## 2024-09-29 NOTE — Telephone Encounter (Signed)
ATC x 1 

## 2024-10-03 NOTE — Telephone Encounter (Signed)
 Copied from CRM #[id number]. Topic: Clinical - Medication Question >> Sep 29, 2024  4:01 PM Isabell A wrote: Reason for CRM: Patient returning phone call - he does not take lisinopril  but is currently taking valsartan .  Callback number: [phone]  ----------------------------------------------------------------------------------------------------------------------------------------  Dr. Geronimo, Patient is not taking Lisinopril .  He is taking valsartan .

## 2024-10-05 ENCOUNTER — Inpatient Hospital Stay: Admission: RE | Admit: 2024-10-05 | Discharge: 2024-10-05 | Attending: Internal Medicine | Admitting: Internal Medicine

## 2024-10-05 DIAGNOSIS — Z862 Personal history of diseases of the blood and blood-forming organs and certain disorders involving the immune mechanism: Secondary | ICD-10-CM

## 2024-10-05 DIAGNOSIS — Z8709 Personal history of other diseases of the respiratory system: Secondary | ICD-10-CM

## 2024-10-05 DIAGNOSIS — R053 Chronic cough: Secondary | ICD-10-CM

## 2024-10-05 DIAGNOSIS — J387 Other diseases of larynx: Secondary | ICD-10-CM

## 2024-10-05 DIAGNOSIS — R0609 Other forms of dyspnea: Secondary | ICD-10-CM

## 2024-10-05 DIAGNOSIS — Z889 Allergy status to unspecified drugs, medicaments and biological substances status: Secondary | ICD-10-CM

## 2024-10-05 DIAGNOSIS — R1310 Dysphagia, unspecified: Secondary | ICD-10-CM

## 2024-10-06 ENCOUNTER — Telehealth: Payer: Self-pay | Admitting: Internal Medicine

## 2024-10-06 NOTE — Telephone Encounter (Signed)
 error

## 2024-10-17 ENCOUNTER — Other Ambulatory Visit (HOSPITAL_COMMUNITY): Payer: Self-pay

## 2024-10-17 ENCOUNTER — Ambulatory Visit: Payer: Self-pay | Admitting: Internal Medicine

## 2024-10-17 NOTE — Progress Notes (Signed)
 1. No evidence of fibrotic interstitial lung disease. Mild air trapping is indicative of small airways disease. 2. Endobronchial debris with mild ill-defined peribronchovascular nodularity in the left lower lobe, findings indicative of aspiration. 3. Aortic atherosclerosis (ICD10-I70.0). Coronary artery calcification.     Electronically Signed   By: Newell Eke M.D.   On: 10/06/2024 14:53    Will discuss these at follow-up visit

## 2024-10-20 ENCOUNTER — Ambulatory Visit: Attending: Internal Medicine

## 2024-10-20 ENCOUNTER — Other Ambulatory Visit: Payer: Self-pay

## 2024-10-20 DIAGNOSIS — J4550 Severe persistent asthma, uncomplicated: Secondary | ICD-10-CM

## 2024-10-20 DIAGNOSIS — Z79899 Other long term (current) drug therapy: Secondary | ICD-10-CM

## 2024-10-20 MED ORDER — DUPIXENT 300 MG/2ML ~~LOC~~ SOAJ
300.0000 mg | SUBCUTANEOUS | 2 refills | Status: AC
  Filled 2024-10-20: qty 4, 28d supply, fill #0

## 2024-10-20 NOTE — Progress Notes (Signed)
 Specialty Pharmacy Refill Coordination Note  Jeffery Underwood is a 69 y.o. male contacted today regarding refills of specialty medication(s) Dupilumab  (Dupixent )   Patient requested Delivery   Delivery date: 10/27/24   Verified address: [address]. [place].  [address]   Medication will be filled on: 10/26/24  Patient approved $50 copay, ccof

## 2024-10-20 NOTE — Progress Notes (Signed)
 Specialty Pharmacy Ongoing Clinical Assessment Note  Jeffery Underwood is a 70 y.o. male who is being followed by the specialty pharmacy service for RxSp Asthma/COPD   Patient's specialty medication(s) reviewed today: Dupilumab  (Dupixent )   Missed doses in the last 4 weeks: 0   Patient/Caregiver did not have any additional questions or concerns.   Therapeutic benefit summary: Patient is achieving benefit   Adverse events/side effects summary: No adverse events/side effects   Patient's therapy is appropriate to: Continue    Goals Addressed             This Visit's Progress    Maintain optimal adherence to therapy   On track    Patient is on track. Patient will maintain adherence and be monitored by provider to determine if a change in treatment plan is warranted.      Minimize recurrence of flares   On track    Patient is on track. Patient will avoid flare triggers and be monitored by provider to determine if a change in treatment plan is warranted. Patient stated that breathing is much better. Per office note from 5/13, provider is determining if cough is reflux related.         Follow up: 12 months  Jeffery Underwood Specialty Pharmacist

## 2024-10-20 NOTE — Addendum Note (Signed)
 Addended by: SHAREN DELON HERO on: 10/20/2024 12:59 PM   Modules accepted: Orders

## 2024-10-20 NOTE — Progress Notes (Signed)
 [facility] - Continuation of Therapy with Biologic  Referring Provider: Dorethia Cave  Virtual Visit via Telephone Note  I connected with Jeffery Underwood on 10/20/24 at  1:00 PM EST by telephone and verified that I am speaking with the correct person using two identifiers.  Location: Patient: home Provider: office   I discussed the limitations, risks, security and privacy concerns of performing an evaluation and management service by telephone and the availability of in person appointments. I also discussed with the patient that there may be a patient responsible charge related to this service. The patient expressed understanding and agreed to proceed.  HPI: Jeffery Underwood is a 69 y.o. male who presents to the pharmacotherapy clinic via telephone for continuation of therapy with Dupixent . Last OV with Dorethia Cave was on 09/28/24.   Indication: asthma Dosing: 300mg  every 14 days  Patient's current respiratory regimen: Airsupra , Montelukast , Albuterol  PRN  Patient Active Problem List   Diagnosis Date Noted   Recurrent major depression 09/05/2024   Pure hypertriglyceridemia 09/05/2024   Mild intermittent asthma 09/05/2024   Major depressive disorder, single episode, mild 09/05/2024   Impaired fasting glucose 09/05/2024   Hypertensive retinopathy 09/05/2024   Hardening of the aorta (main artery of the heart) 09/05/2024   Primary hypertension 02/12/2023   Elevated coronary artery calcium  score 02/12/2023   Mixed hyperlipidemia 02/12/2023   Dyspnea on exertion 01/12/2023   Decreased exercise tolerance 01/12/2023   Asthmatic bronchitis, moderate persistent, uncomplicated 09/24/2022   Hx of bacterial pneumonia 06/21/2022   Atypical atrial flutter (HCC) 07/07/2021   Persistent atrial fibrillation (HCC) 07/07/2021   OSA (obstructive sleep apnea) 09/22/2013   Skin cancer 04/28/2004    Patient's Medications  New Prescriptions   No medications on file   Previous Medications   ALBUTEROL  (VENTOLIN  HFA) 108 (90 BASE) MCG/ACT INHALER    Inhale 2 puffs into the lungs as needed for wheezing or shortness of breath.   ALBUTEROL -BUDESONIDE (AIRSUPRA ) 90-80 MCG/ACT AERO    INHALE 2 PUFFS INTO THE LUNGS EVERY 6 (SIX) HOURS AS NEEDED.   AZELASTINE  (ASTELIN ) 0.1 % NASAL SPRAY    Place 1 spray into both nostrils 2 (two) times daily. Use in each nostril as directed   AZELASTINE  (OPTIVAR ) 0.05 % OPHTHALMIC SOLUTION    Apply 1 drop to eye 2 (two) times daily as needed (itchy watery eyes).   B COMPLEX VITAMINS CAPSULE    Take 1 capsule by mouth daily.   BENZONATATE  (TESSALON ) 200 MG CAPSULE    Take 1 capsule (200 mg total) by mouth 3 (three) times daily as needed for cough.   BUPROPION (WELLBUTRIN XL) 300 MG 24 HR TABLET    Take 300 mg by mouth in the morning.   CETIRIZINE  (ZYRTEC ) 10 MG TABLET    Take 1 tablet (10 mg total) by mouth daily.   DILTIAZEM  (CARDIZEM  CD) 180 MG 24 HR CAPSULE    TAKE 1 CAPSULE EVERY DAY   ELIQUIS  5 MG TABS TABLET    TAKE 1 TABLET TWICE DAILY   FLUTICASONE  (FLONASE ) 50 MCG/ACT NASAL SPRAY    Place 2 sprays into both nostrils daily.   FLUTICASONE -SALMETEROL (ADVAIR HFA) 230-21 MCG/ACT INHALER    Inhale 2 puffs into the lungs 2 (two) times daily.   GABAPENTIN  (NEURONTIN ) 100 MG CAPSULE    Take 1 capsule (100 mg total) by mouth at bedtime.   IRON-VITAMIN C PO    Take 1 tablet by mouth daily.   LIDOCAINE  (LIDODERM ) 5 %  Place 1 patch onto the skin daily. Remove & Discard patch within 12 hours or as directed by MD   METOPROLOL  TARTRATE (LOPRESSOR ) 25 MG TABLET    TAKE 1/2 TABLET TWICE DAILY   MONTELUKAST  (SINGULAIR ) 10 MG TABLET    TAKE 1 TABLET AT BEDTIME   MULTIPLE VITAMINS-MINERALS (MULTIVITAMIN PO)    Take 1 tablet by mouth at bedtime.   OMEPRAZOLE  (PRILOSEC) 40 MG CAPSULE    TAKE 1 CAPSULE EVERY DAY   PROMETHAZINE -DEXTROMETHORPHAN (PROMETHAZINE -DM) 6.25-15 MG/5ML SYRUP    Take 5 mLs by mouth 4 (four) times daily as needed for cough.    RESPIRATORY THERAPY SUPPLIES (CARETOUCH 2 CPAP HOSE HANGER) MISC    See admin instructions.   ROSUVASTATIN  (CRESTOR ) 10 MG TABLET    TAKE 1 TABLET EVERY DAY   VALSARTAN  (DIOVAN ) 40 MG TABLET    Take 1 tablet (40 mg total) by mouth daily.   VITAMIN E 1000 UNIT CAPSULE    Take 1,000 Units by mouth at bedtime.  Modified Medications   Modified Medication Previous Medication   DUPILUMAB  (DUPIXENT ) 300 MG/2ML SOAJ Dupilumab  (DUPIXENT ) 300 MG/2ML SOAJ      Inject 300 mg into the skin every 14 (fourteen) days. **loading dose completed in clinic on 08/16/23**    Inject 300 mg into the skin every 14 (fourteen) days. **loading dose completed in clinic on 08/16/23**  Discontinued Medications   No medications on file    Allergies: Allergies[1]  Past Medical History: 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   Right Anterior Neck   OSA (obstructive sleep apnea)    Prediabetes 09/21/2004   resolved 10/2011   Psoriasis    Recurrent upper respiratory infection (URI)    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   Right Sideburn (well diff)   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   Left Sideburn (in situ)   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   Left [place] (well diff)   Seasonal allergies     Social History: Social History   Socioeconomic History   Marital status: Married    Spouse name: Not on file   Number of children: 1   Years of education: Not on file   Highest education level: Not on file  Occupational History   Not on file  Tobacco Use   Smoking status: Never   Smokeless tobacco: Never  Vaping Use   Vaping status: Never Used  Substance and Sexual Activity   Alcohol use: Yes    Alcohol/week: 14.0 standard drinks of alcohol    Types: 14 Cans of beer per week    Comment: 3-5 per  week   Drug use: No   Sexual activity: Yes  Other Topics Concern   Not on file  Social History Narrative   Not on file   Social Drivers of Health   Tobacco Use: Low Risk (09/28/2024)   Patient History    Smoking Tobacco Use: Never    Smokeless Tobacco Use: Never    Passive Exposure: Not on file  Financial Resource Strain: Not on file  Food Insecurity: Not on file  Transportation Needs: Not on file  Physical Activity: Not on file  Stress: Not on file  Social Connections: Not on file  Depression (EYV7-0): Not on file  Alcohol Screen:  Not on file  Housing: Not on file  Utilities: Not on file  Health Literacy: Not on file     Assessment/Plan: 1. Patient prescribed Dupixent  for asthma. Reviewed the medication with the patient, including the following:   Goals of therapy: Mechanism: monoclonal antibody used for the treatment of asthma or asthma/COPD overlap Reviewed that Dupixent  is add-on medication and patient must continue maintenance inhaler regimen. Response to therapy: may take 3-4 months to determine efficacy.  Side effects: injection site reaction, antibody development, arthralgia, ocular effects  Dose: Dupixent  300mg  once every 2 weeks  Administration/Storage:  Administer as a SubQ injection and rotate sites.  Keep medication in refrigerator, do not freeze.  Allow the medication to reach room temp prior to administration (45 mins for 300 mg syringe or 30 min for 200 mg syringe). Do not shake.   Plan:  - Patient will continue injections every 2 weeks at home.  - Rx will be triaged to [facility] for patient home.   I discussed the assessment and treatment plan with the patient. The patient was provided an opportunity to ask questions and all were answered. The patient agreed with the plan and demonstrated an understanding of the instructions.   The patient was advised to call back or seek an in-person evaluation if the symptoms worsen or if the  condition fails to improve as anticipated.  I provided 15 minutes of non-face-to-face time during this encounter.  Patient verbalizes understanding and agreement with plan.   Delon Brow, PharmD, CSP, AAHIVP, CPP Clinical Pharmacist Practitioner - Medication Therapy Disease Management/Specialty Pharmacy Services 10/20/2024, 1:35 PM     [1] No Known Allergies

## 2024-10-26 ENCOUNTER — Other Ambulatory Visit: Payer: Self-pay

## 2024-10-31 ENCOUNTER — Ambulatory Visit: Admitting: Internal Medicine

## 2024-10-31 ENCOUNTER — Encounter (HOSPITAL_BASED_OUTPATIENT_CLINIC_OR_DEPARTMENT_OTHER)

## 2024-12-19 ENCOUNTER — Ambulatory Visit: Admitting: Cardiology

## 2025-01-02 ENCOUNTER — Encounter

## 2025-01-02 ENCOUNTER — Ambulatory Visit: Admitting: Internal Medicine
